# Patient Record
Sex: Male | Born: 1945
Health system: Southern US, Community
[De-identification: ages and names within clinical notes are randomized; demographics above are authoritative.]

## PROBLEM LIST (undated history)

## (undated) DIAGNOSIS — K219 Gastro-esophageal reflux disease without esophagitis: Secondary | ICD-10-CM

## (undated) DIAGNOSIS — I10 Essential (primary) hypertension: Secondary | ICD-10-CM

## (undated) DIAGNOSIS — E119 Type 2 diabetes mellitus without complications: Secondary | ICD-10-CM

## (undated) HISTORY — PX: FRACTURE SURGERY: SHX138

## (undated) HISTORY — PX: EYE SURGERY: SHX253

## (undated) HISTORY — PX: FEMUR FRACTURE SURGERY: SHX633

---

## 2005-11-26 ENCOUNTER — Ambulatory Visit: Payer: Self-pay | Admitting: Unknown Physician Specialty

## 2005-12-10 ENCOUNTER — Inpatient Hospital Stay: Payer: Self-pay | Admitting: Internal Medicine

## 2005-12-10 ENCOUNTER — Other Ambulatory Visit: Payer: Self-pay

## 2007-11-20 ENCOUNTER — Emergency Department: Payer: Self-pay | Admitting: Unknown Physician Specialty

## 2007-11-20 ENCOUNTER — Other Ambulatory Visit: Payer: Self-pay

## 2011-03-17 LAB — HM COLONOSCOPY

## 2012-06-30 ENCOUNTER — Ambulatory Visit: Payer: Self-pay | Admitting: Family Medicine

## 2013-02-08 ENCOUNTER — Emergency Department: Payer: Self-pay

## 2013-02-08 LAB — CBC
HCT: 44 % (ref 40.0–52.0)
MCHC: 35.3 g/dL (ref 32.0–36.0)
Platelet: 242 10*3/uL (ref 150–440)
RBC: 5.17 10*6/uL (ref 4.40–5.90)
RDW: 14.5 % (ref 11.5–14.5)
WBC: 11.6 10*3/uL — ABNORMAL HIGH (ref 3.8–10.6)

## 2013-02-08 LAB — COMPREHENSIVE METABOLIC PANEL
Alkaline Phosphatase: 48 U/L — ABNORMAL LOW (ref 50–136)
Anion Gap: 9 (ref 7–16)
Chloride: 101 mmol/L (ref 98–107)
Creatinine: 0.89 mg/dL (ref 0.60–1.30)
EGFR (African American): >60
EGFR (Non-African Amer.): >60
Glucose: 96 mg/dL (ref 65–99)
Potassium: 3.7 mmol/L (ref 3.5–5.1)
SGOT(AST): 9 U/L — ABNORMAL LOW (ref 15–37)
SGPT (ALT): 18 U/L (ref 12–78)
Sodium: 134 mmol/L — ABNORMAL LOW (ref 136–145)
Total Protein: 7.8 g/dL (ref 6.4–8.2)

## 2013-02-08 LAB — URINALYSIS, COMPLETE
Blood: NEGATIVE
Glucose,UR: NEGATIVE mg/dL (ref 0–75)
Hyaline Cast: 3
Leukocyte Esterase: NEGATIVE
Nitrite: NEGATIVE
Ph: 5 (ref 4.5–8.0)
Protein: NEGATIVE
Specific Gravity: 1.015 (ref 1.003–1.030)
WBC UR: 1 /HPF (ref 0–5)

## 2013-02-08 LAB — CK TOTAL AND CKMB (NOT AT ARMC)
CK-MB: 1 ng/mL (ref 0.5–3.6)
CK-MB: 0.9 ng/mL (ref 0.5–3.6)

## 2013-02-08 LAB — LIPASE, BLOOD: Lipase: 221 U/L (ref 73–393)

## 2013-02-08 LAB — TROPONIN I: Troponin-I: <0.02 ng/mL

## 2016-01-03 DIAGNOSIS — F17200 Nicotine dependence, unspecified, uncomplicated: Secondary | ICD-10-CM | POA: Diagnosis not present

## 2016-01-03 DIAGNOSIS — I10 Essential (primary) hypertension: Secondary | ICD-10-CM | POA: Diagnosis not present

## 2016-01-03 DIAGNOSIS — R5383 Other fatigue: Secondary | ICD-10-CM | POA: Diagnosis not present

## 2016-01-03 DIAGNOSIS — Z125 Encounter for screening for malignant neoplasm of prostate: Secondary | ICD-10-CM | POA: Diagnosis not present

## 2016-01-03 DIAGNOSIS — G4489 Other headache syndrome: Secondary | ICD-10-CM | POA: Diagnosis not present

## 2016-01-03 DIAGNOSIS — Z13818 Encounter for screening for other digestive system disorders: Secondary | ICD-10-CM | POA: Diagnosis not present

## 2016-01-06 DIAGNOSIS — R5383 Other fatigue: Secondary | ICD-10-CM | POA: Diagnosis not present

## 2016-01-06 DIAGNOSIS — R05 Cough: Secondary | ICD-10-CM | POA: Diagnosis not present

## 2016-01-10 DIAGNOSIS — R5383 Other fatigue: Secondary | ICD-10-CM | POA: Diagnosis not present

## 2016-01-10 DIAGNOSIS — I10 Essential (primary) hypertension: Secondary | ICD-10-CM | POA: Diagnosis not present

## 2016-01-10 DIAGNOSIS — G4489 Other headache syndrome: Secondary | ICD-10-CM | POA: Diagnosis not present

## 2016-01-10 DIAGNOSIS — E871 Hypo-osmolality and hyponatremia: Secondary | ICD-10-CM | POA: Diagnosis not present

## 2016-01-10 DIAGNOSIS — K219 Gastro-esophageal reflux disease without esophagitis: Secondary | ICD-10-CM | POA: Diagnosis not present

## 2016-01-17 DIAGNOSIS — F329 Major depressive disorder, single episode, unspecified: Secondary | ICD-10-CM | POA: Diagnosis not present

## 2016-01-17 DIAGNOSIS — I10 Essential (primary) hypertension: Secondary | ICD-10-CM | POA: Diagnosis not present

## 2016-01-17 DIAGNOSIS — G4700 Insomnia, unspecified: Secondary | ICD-10-CM | POA: Diagnosis not present

## 2016-01-27 DIAGNOSIS — G4700 Insomnia, unspecified: Secondary | ICD-10-CM | POA: Diagnosis not present

## 2016-01-27 DIAGNOSIS — I10 Essential (primary) hypertension: Secondary | ICD-10-CM | POA: Diagnosis not present

## 2016-01-27 DIAGNOSIS — R110 Nausea: Secondary | ICD-10-CM | POA: Diagnosis not present

## 2016-01-27 DIAGNOSIS — I951 Orthostatic hypotension: Secondary | ICD-10-CM | POA: Diagnosis not present

## 2016-01-27 DIAGNOSIS — F329 Major depressive disorder, single episode, unspecified: Secondary | ICD-10-CM | POA: Diagnosis not present

## 2016-01-27 DIAGNOSIS — G4489 Other headache syndrome: Secondary | ICD-10-CM | POA: Diagnosis not present

## 2016-01-27 DIAGNOSIS — M62830 Muscle spasm of back: Secondary | ICD-10-CM | POA: Diagnosis not present

## 2016-02-07 ENCOUNTER — Other Ambulatory Visit: Payer: Self-pay | Admitting: Internal Medicine

## 2016-02-07 DIAGNOSIS — R42 Dizziness and giddiness: Secondary | ICD-10-CM

## 2016-02-07 DIAGNOSIS — R110 Nausea: Secondary | ICD-10-CM

## 2016-02-07 DIAGNOSIS — I10 Essential (primary) hypertension: Secondary | ICD-10-CM | POA: Diagnosis not present

## 2016-02-07 DIAGNOSIS — F329 Major depressive disorder, single episode, unspecified: Secondary | ICD-10-CM | POA: Diagnosis not present

## 2016-02-07 DIAGNOSIS — J449 Chronic obstructive pulmonary disease, unspecified: Secondary | ICD-10-CM | POA: Diagnosis not present

## 2016-02-07 DIAGNOSIS — G4700 Insomnia, unspecified: Secondary | ICD-10-CM | POA: Diagnosis not present

## 2016-02-07 DIAGNOSIS — Z87898 Personal history of other specified conditions: Secondary | ICD-10-CM

## 2016-02-07 DIAGNOSIS — M62830 Muscle spasm of back: Secondary | ICD-10-CM | POA: Diagnosis not present

## 2016-02-27 ENCOUNTER — Ambulatory Visit: Payer: Self-pay

## 2016-04-17 DIAGNOSIS — I10 Essential (primary) hypertension: Secondary | ICD-10-CM | POA: Diagnosis not present

## 2016-04-17 DIAGNOSIS — K222 Esophageal obstruction: Secondary | ICD-10-CM | POA: Diagnosis not present

## 2016-08-18 DIAGNOSIS — Z1389 Encounter for screening for other disorder: Secondary | ICD-10-CM | POA: Diagnosis not present

## 2016-08-18 DIAGNOSIS — Z0000 Encounter for general adult medical examination without abnormal findings: Secondary | ICD-10-CM | POA: Diagnosis not present

## 2016-10-20 DIAGNOSIS — G4700 Insomnia, unspecified: Secondary | ICD-10-CM | POA: Diagnosis not present

## 2016-10-20 DIAGNOSIS — R252 Cramp and spasm: Secondary | ICD-10-CM | POA: Diagnosis not present

## 2016-10-20 DIAGNOSIS — F419 Anxiety disorder, unspecified: Secondary | ICD-10-CM | POA: Diagnosis not present

## 2016-10-20 DIAGNOSIS — E559 Vitamin D deficiency, unspecified: Secondary | ICD-10-CM | POA: Diagnosis not present

## 2016-10-20 DIAGNOSIS — Z1329 Encounter for screening for other suspected endocrine disorder: Secondary | ICD-10-CM | POA: Diagnosis not present

## 2016-10-20 DIAGNOSIS — Z13818 Encounter for screening for other digestive system disorders: Secondary | ICD-10-CM | POA: Diagnosis not present

## 2016-10-20 DIAGNOSIS — I10 Essential (primary) hypertension: Secondary | ICD-10-CM | POA: Diagnosis not present

## 2016-10-20 DIAGNOSIS — K219 Gastro-esophageal reflux disease without esophagitis: Secondary | ICD-10-CM | POA: Diagnosis not present

## 2016-10-20 DIAGNOSIS — F321 Major depressive disorder, single episode, moderate: Secondary | ICD-10-CM | POA: Diagnosis not present

## 2016-10-20 DIAGNOSIS — N529 Male erectile dysfunction, unspecified: Secondary | ICD-10-CM | POA: Diagnosis not present

## 2016-10-20 LAB — HEPATIC FUNCTION PANEL
ALT: 6 — AB (ref 10–40)
AST: 13 — AB (ref 14–40)
Alkaline Phosphatase: 47 (ref 25–125)
BILIRUBIN, TOTAL: 0.7

## 2016-10-20 LAB — HEMOGLOBIN A1C: Hemoglobin A1C: 6.1

## 2016-10-20 LAB — CBC AND DIFFERENTIAL
HCT: 46 (ref 41–53)
HEMOGLOBIN: 15.8 (ref 13.5–17.5)
NEUTROS ABS: 4
PLATELETS: 323 (ref 150–399)
WBC: 8.9

## 2016-10-20 LAB — LIPID PANEL
Cholesterol: 260 — AB (ref 0–200)
HDL: 46 (ref 35–70)
LDL Cholesterol: 157
TRIGLYCERIDES: 287 — AB (ref 40–160)

## 2016-10-20 LAB — BASIC METABOLIC PANEL
BUN: 8 (ref 4–21)
Creatinine: 0.8 (ref 0.6–1.3)
GLUCOSE: 127
POTASSIUM: 4.6 (ref 3.4–5.3)
Sodium: 130 — AB (ref 137–147)

## 2016-10-20 LAB — VITAMIN D 25 HYDROXY (VIT D DEFICIENCY, FRACTURES): Vit D, 25-Hydroxy: 18.1

## 2016-11-11 ENCOUNTER — Other Ambulatory Visit: Payer: Self-pay | Admitting: Adult Health

## 2016-11-11 DIAGNOSIS — Z87891 Personal history of nicotine dependence: Secondary | ICD-10-CM

## 2016-11-19 ENCOUNTER — Ambulatory Visit
Admission: RE | Admit: 2016-11-19 | Discharge: 2016-11-19 | Disposition: A | Discharge status: Home / Self Care | Payer: PPO | Source: Ambulatory Visit | Attending: Adult Health | Admitting: Adult Health

## 2016-11-19 DIAGNOSIS — Z87891 Personal history of nicotine dependence: Secondary | ICD-10-CM | POA: Diagnosis not present

## 2016-11-19 DIAGNOSIS — N281 Cyst of kidney, acquired: Secondary | ICD-10-CM | POA: Diagnosis not present

## 2016-11-19 DIAGNOSIS — R935 Abnormal findings on diagnostic imaging of other abdominal regions, including retroperitoneum: Secondary | ICD-10-CM | POA: Diagnosis not present

## 2016-11-30 DIAGNOSIS — F419 Anxiety disorder, unspecified: Secondary | ICD-10-CM | POA: Diagnosis not present

## 2016-11-30 DIAGNOSIS — G4700 Insomnia, unspecified: Secondary | ICD-10-CM | POA: Diagnosis not present

## 2016-11-30 DIAGNOSIS — F321 Major depressive disorder, single episode, moderate: Secondary | ICD-10-CM | POA: Diagnosis not present

## 2016-11-30 DIAGNOSIS — I10 Essential (primary) hypertension: Secondary | ICD-10-CM | POA: Diagnosis not present

## 2016-12-25 DIAGNOSIS — E119 Type 2 diabetes mellitus without complications: Secondary | ICD-10-CM | POA: Diagnosis not present

## 2016-12-28 DIAGNOSIS — I10 Essential (primary) hypertension: Secondary | ICD-10-CM | POA: Diagnosis not present

## 2017-01-14 DIAGNOSIS — I10 Essential (primary) hypertension: Secondary | ICD-10-CM | POA: Diagnosis not present

## 2017-05-11 ENCOUNTER — Ambulatory Visit (INDEPENDENT_AMBULATORY_CARE_PROVIDER_SITE_OTHER): Payer: PPO | Admitting: Physician Assistant

## 2017-05-11 ENCOUNTER — Encounter: Payer: Self-pay | Admitting: Physician Assistant

## 2017-05-11 VITALS — BP 126/84 | HR 88 | Temp 97.4°F | Resp 16 | Ht 69.0 in | Wt 144.0 lb

## 2017-05-11 DIAGNOSIS — F419 Anxiety disorder, unspecified: Secondary | ICD-10-CM | POA: Diagnosis not present

## 2017-05-11 DIAGNOSIS — Z23 Encounter for immunization: Secondary | ICD-10-CM

## 2017-05-11 DIAGNOSIS — Z8719 Personal history of other diseases of the digestive system: Secondary | ICD-10-CM

## 2017-05-11 DIAGNOSIS — Z7689 Persons encountering health services in other specified circumstances: Secondary | ICD-10-CM | POA: Diagnosis not present

## 2017-05-11 DIAGNOSIS — G4700 Insomnia, unspecified: Secondary | ICD-10-CM | POA: Insufficient documentation

## 2017-05-11 DIAGNOSIS — F4323 Adjustment disorder with mixed anxiety and depressed mood: Secondary | ICD-10-CM | POA: Insufficient documentation

## 2017-05-11 DIAGNOSIS — Z8711 Personal history of peptic ulcer disease: Secondary | ICD-10-CM

## 2017-05-11 DIAGNOSIS — I10 Essential (primary) hypertension: Secondary | ICD-10-CM | POA: Insufficient documentation

## 2017-05-11 MED ORDER — AMLODIPINE BESYLATE 5 MG PO TABS: 5.0000 mg | ORAL_TABLET | Freq: Every day | ORAL | 2 refills | Status: DC

## 2017-05-11 MED ORDER — LISINOPRIL 40 MG PO TABS: 40.0000 mg | ORAL_TABLET | Freq: Every day | ORAL | 2 refills | Status: DC

## 2017-05-11 MED ORDER — ZOLPIDEM TARTRATE 10 MG PO TABS: 10.0000 mg | ORAL_TABLET | Freq: Every evening | ORAL | 0 refills | Status: DC | PRN

## 2017-05-11 MED ORDER — ESCITALOPRAM OXALATE 10 MG PO TABS: 10.0000 mg | ORAL_TABLET | Freq: Every day | ORAL | 2 refills | Status: DC

## 2017-05-11 NOTE — Patient Instructions (Signed)

## 2017-05-11 NOTE — Progress Notes (Addendum)
Patient: Derrick Jones Male    DOB: [birth year]   71 y.o.   MRN: [id number] Visit Date: 05/11/2017  Today's Provider: Trinna Post, PA-C   Chief Complaint  Patient presents with  . Establish Care  . Anxiety   Subjective:      Derrick Jones is a 71 y/o man presenting today to establish care. He used to see Alvester Chou, NP at [facility] but his insurance changed and so he needed a new provider.   He has a history of HTN currently controlled on Lisinopril 40 mg QD and Norvasc 5 mg QD. He does not have headache, CP, edema, dizziness.   He has a history of insomnia. He reports he was taking Ambien 10 mg QD for this x several years. He has failed melatonin.  He has a history of anxiety. He was previously on Buspar 15 mg BID, which he took for three weeks and he felt didn't work. His anxiety has recently worsened. His 62 year old daughter who had breast cancer in the distant past was recently diagnosed with leptomeningeal metastasis. She is undergoing treatment at [facility] but her prognosis is poor and she is becoming increasingly less mobile. He has stopped taking his Buspar. He is not interested in counseling. No SI/HI  He smokes 1/2 pack per day and has for the past 15-20 years.  He has a history of alcohol misuse but does not currently use alcohol or drugs.  He has been married for 53 years. He has no other children. Denies history of colon or prostate cancer. He reports he had a colonoscopy 8 years ago at [facility].  He has a history of esophageal stricture diagnosed on EGD in 06/25/11 at [facility]. He also has a (+) history of H. Pylori which was treated. Recommended at that time 20 mg omeprazole BID indefinitely. He had a repeat EGD in 2014 that redemonstrated esophageal stricture and some erythematous mucosa. Pathology revealed no significant abnormality. Recommended to avoid ASA and NSAIDS, continue omeprazole 20 mg BID.  Anxiety  Presents for initial visit. Onset was 1 to 6 months  ago. The problem has been rapidly worsening. Symptoms include excessive worry, insomnia and nervous/anxious behavior. Patient reports no chest pain, confusion, decreased concentration, feeling of choking, palpitations, shortness of breath or suicidal ideas. The symptoms are aggravated by family issues (Pt's daughter was diagnosed with terminal cancer). The quality of sleep is fair.    Hypertension  This is a chronic problem. The problem is controlled (Home blood pressures are usually 130's /80's). Associated symptoms include anxiety and malaise/fatigue. Pertinent negatives include no blurred vision, chest pain, headaches, neck pain, orthopnea, palpitations, peripheral edema, PND, shortness of breath or sweats.      No Known Allergies   Current Outpatient Prescriptions:  .  omeprazole (PRILOSEC) 40 MG capsule, Take 40 mg by mouth., Disp: , Rfl:  .  amLODipine (NORVASC) 5 MG tablet, , Disp: , Rfl:  .  busPIRone (BUSPAR) 15 MG tablet, , Disp: , Rfl:  .  lisinopril (PRINIVIL,ZESTRIL) 40 MG tablet, , Disp: , Rfl:  .  PNEUMOVAX 23 25 MCG/0.5ML injection, , Disp: , Rfl:  .  zolpidem (AMBIEN) 10 MG tablet, , Disp: , Rfl:   Review of Systems  Constitutional: Positive for fatigue and malaise/fatigue. Negative for activity change, appetite change, chills, diaphoresis, fever and unexpected weight change.  HENT: Negative.   Eyes: Negative.  Negative for blurred vision.  Respiratory: Negative.  Negative for shortness of breath.   Cardiovascular: Negative.  Negative for chest pain, palpitations, orthopnea and PND.  Gastrointestinal: Negative.   Endocrine: Negative.   Genitourinary: Negative.   Musculoskeletal: Negative.  Negative for neck pain.  Skin: Negative.   Allergic/Immunologic: Negative.   Neurological: Negative.  Negative for headaches.  Hematological: Negative.   Psychiatric/Behavioral: Positive for sleep disturbance. Negative for agitation, behavioral problems, confusion, decreased  concentration, dysphoric mood, hallucinations, self-injury and suicidal ideas. The patient is nervous/anxious and has insomnia. The patient is not hyperactive.     Social History  Substance Use Topics  . Smoking status: Current Every Day Smoker  . Smokeless tobacco: Never Used  . Alcohol use No   Objective:   There were no vitals taken for this visit. There were no vitals filed for this visit.   Physical Exam  Constitutional: He is oriented to person, place, and time. He appears well-developed and well-nourished.  Neck: Neck supple.  Cardiovascular: Normal rate and regular rhythm.   Pulmonary/Chest: Effort normal. He has wheezes.  Slight expiratory wheeze in RLL  Abdominal: Soft. Bowel sounds are normal.  Lymphadenopathy:    He has no cervical adenopathy.  Neurological: He is alert and oriented to person, place, and time.  Skin: Skin is warm and dry.  Psychiatric: His mood appears anxious. His affect is not angry. He exhibits a depressed mood.        Assessment & Plan:     1. Encounter to establish care  Requesting colonoscopy from [facility] and prior tx records from PCP. UTD on pneumonia. Says he got Tdap a few years ago after cutting himself on rusty nail. He should f/u w/ me in 2 months to check medication and complete screenings. Also needs AWV, but he wants to wait on this.  2. Adjustment disorder with mixed anxiety and depressed mood  Pt has stopped taking Buspar. Declines counseling. Will try Lexapro. Discussed AE and length of time to work being 8 weeks.  - escitalopram (LEXAPRO) 10 MG tablet; Take 1 tablet (10 mg total) by mouth daily.  Dispense: 30 tablet; Refill: 2  3. Anxiety  See #2.  4. Hypertension, unspecified type  - amLODipine (NORVASC) 5 MG tablet; Take 1 tablet (5 mg total) by mouth daily.  Dispense: 30 tablet; Refill: 2 - lisinopril (PRINIVIL,ZESTRIL) 40 MG tablet; Take 1 tablet (40 mg total) by mouth daily.  Dispense: 30 tablet; Refill: 2  5.  Insomnia, unspecified type  [place]. Shows consistent use since 2013. No early refills, consistent provider and pharmacies. Counseled on sedation risk. Controlled substance agreement signed.  - zolpidem (AMBIEN) 10 MG tablet; Take 1 tablet (10 mg total) by mouth at bedtime as needed for sleep.  Dispense: 30 tablet; Refill: 0  6. History of gastric ulcer  Omeprazole 20 mg BID indefinitely.   7. Flu vaccine need  - Flu vaccine HIGH DOSE PF (Fluzone High dose)  8. History of esophageal stricture  Followed by GI, has had two epiosdes, on in 2012 and one in 2014.   Return in about 2 months (around 07/11/2017) for anxiety, labwork.  The entirety of the information documented in the History of Present Illness, Review of Systems and Physical Exam were personally obtained by me. Portions of this information were initially documented by Ashley Royalty, CMA and reviewed by me for thoroughness and accuracy.          Trinna Post, PA-C  [facility]

## 2017-05-20 ENCOUNTER — Encounter: Payer: Self-pay | Admitting: Physician Assistant

## 2017-05-27 ENCOUNTER — Other Ambulatory Visit: Payer: Self-pay | Admitting: Physician Assistant

## 2017-05-27 DIAGNOSIS — R7303 Prediabetes: Secondary | ICD-10-CM | POA: Insufficient documentation

## 2017-06-02 IMAGING — US US RETROPERITONEAL COMPLETE
1 series · 13 of 25 positions shown · non-contrast
Comparison: CT scan lab 02/08/2013

CLINICAL DATA: History of tobacco use

EXAM:
ULTRASOUND RETROPERITONEAL COMPLETE
TECHNIQUE: Ultrasound examination of the abdominal aorta was performed to
evaluate for abdominal aortic aneurysm. The common iliac arteries,
IVC, and kidneys were also evaluated.

[Series 1: us retroperitoneal complete · 0.23mm/px · 13 of 62 slices shown]
[im 1/62]
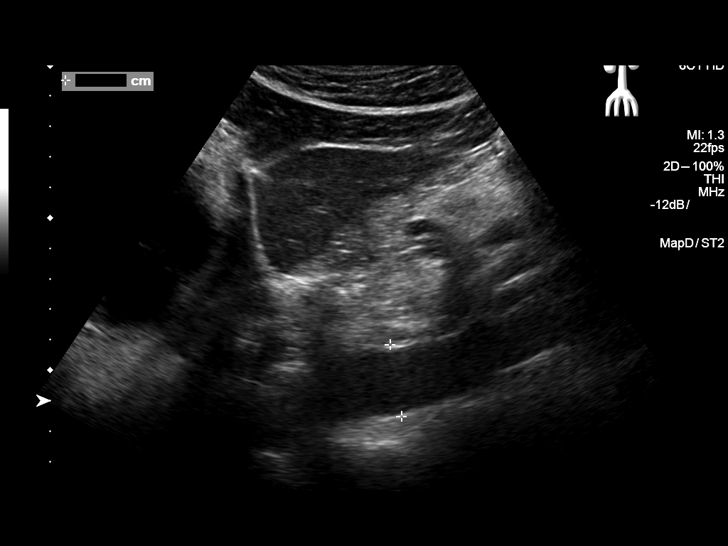
[im 6/62]
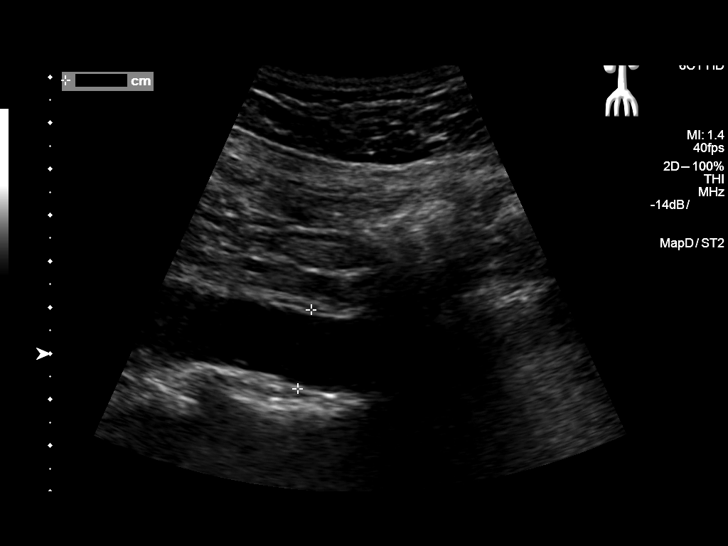
[im 11/62]
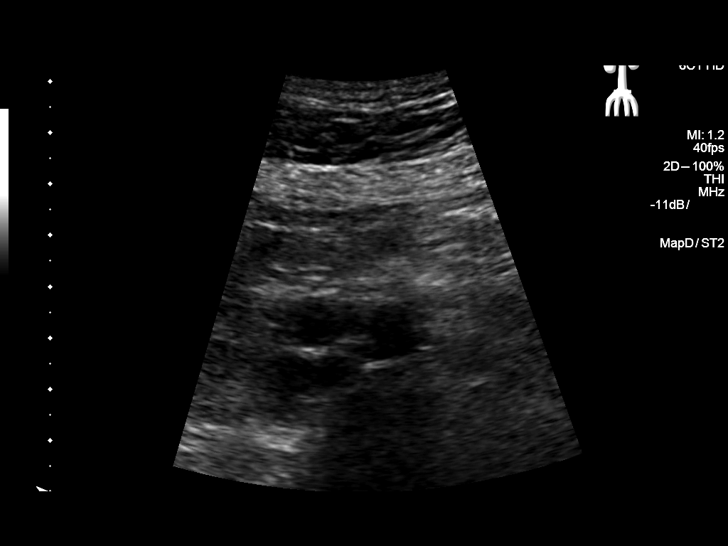
[im 16/62]
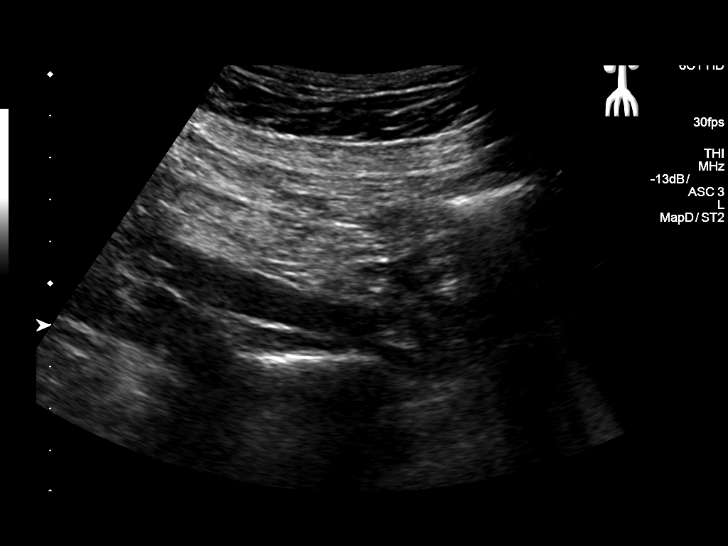
[im 21/62]
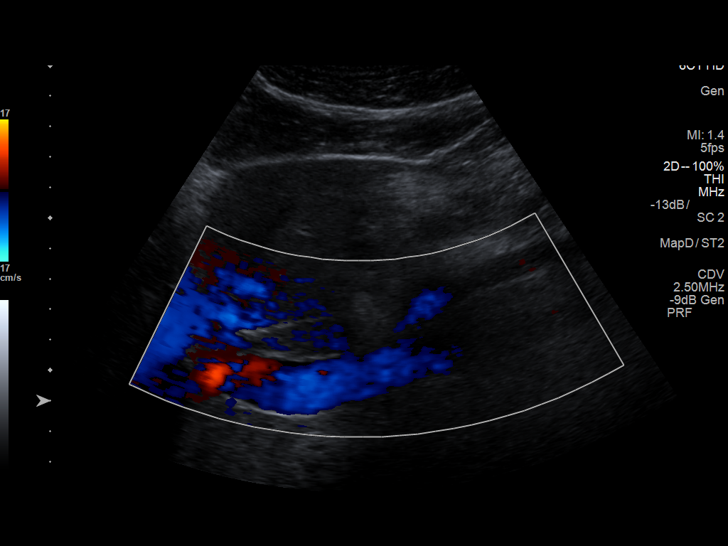
[im 26/62]
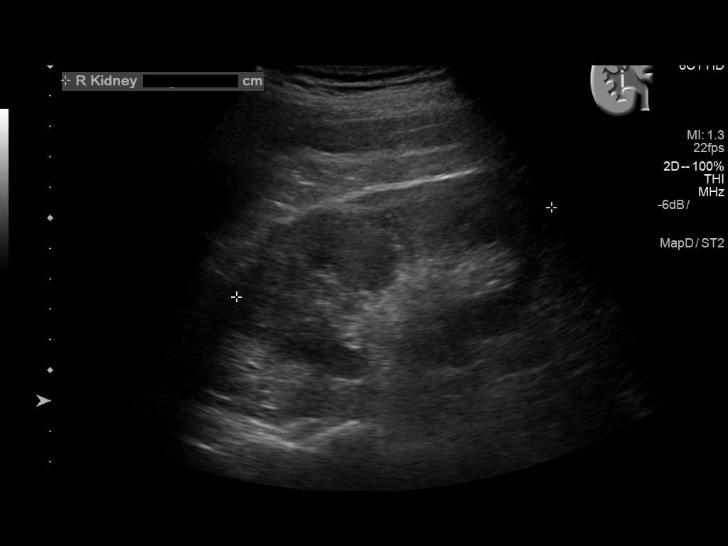
[im 31/62]
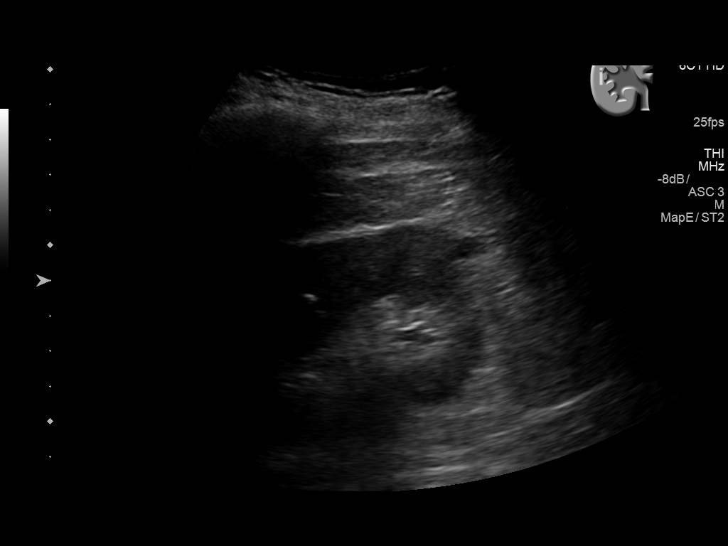
[im 36/62]
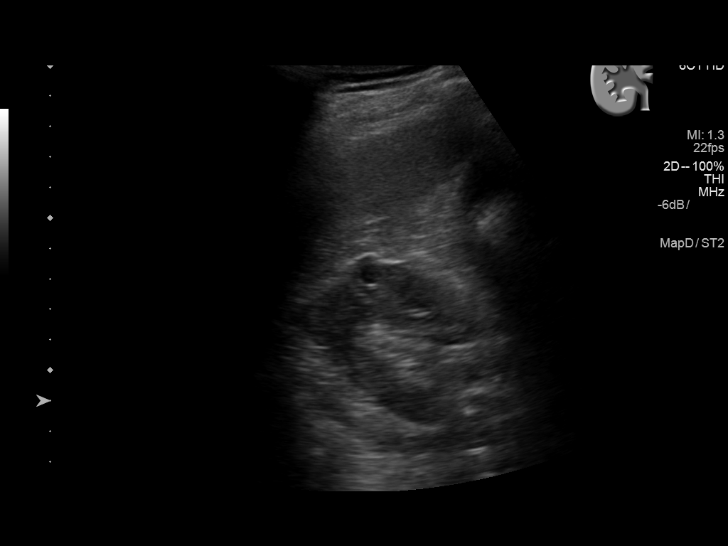
[im 41/62]
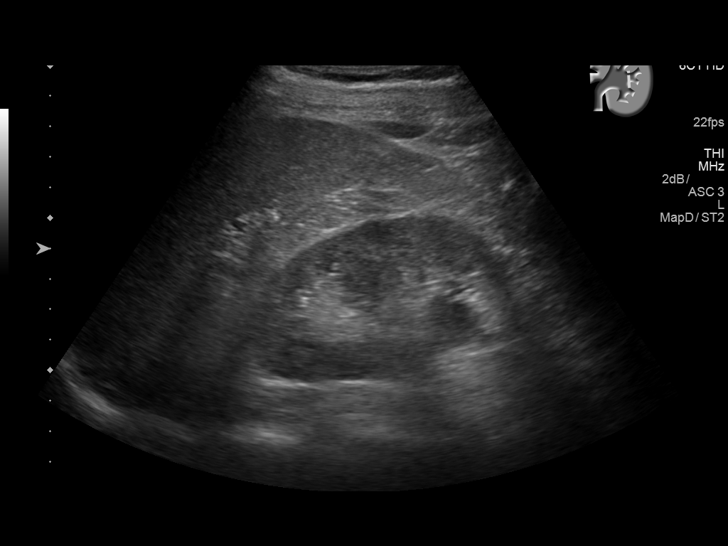
[im 46/62]
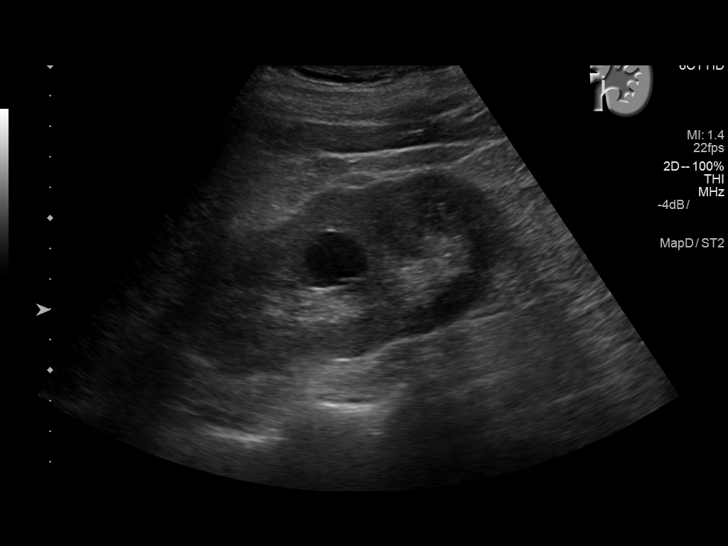
[im 51/62]
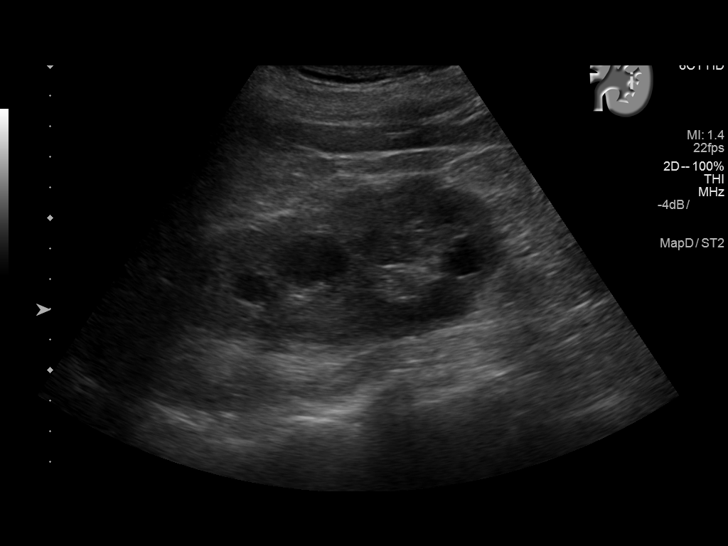
[im 56/62]
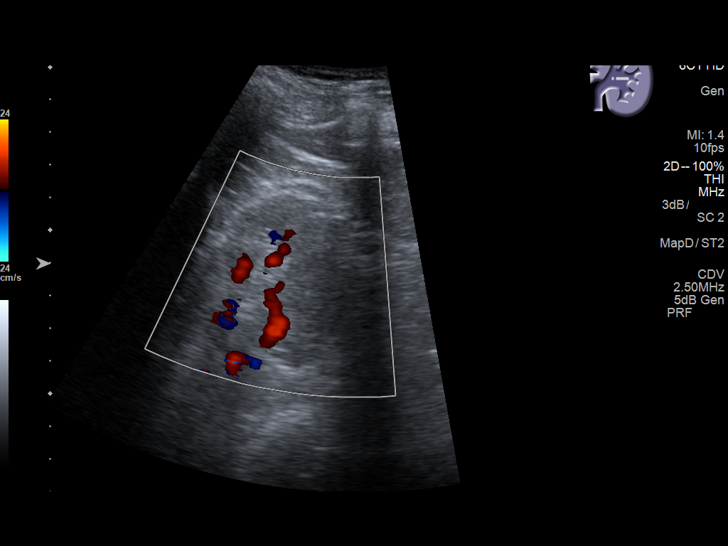
[im 62/62]
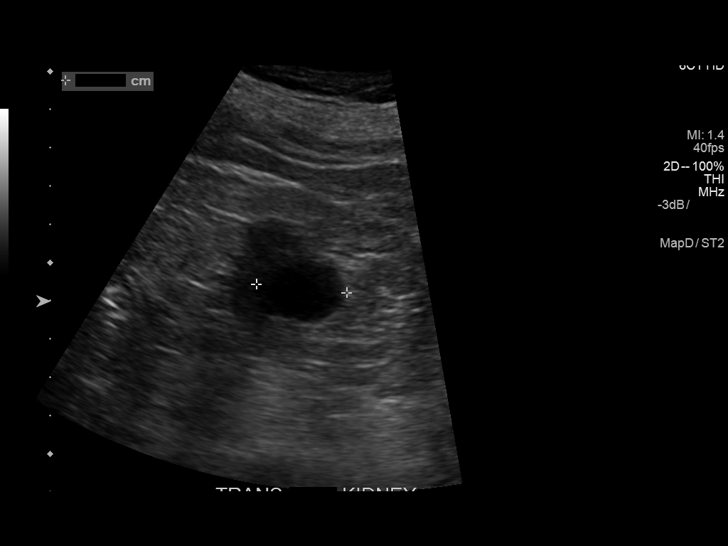

[13 of 25 positions shown; findings below may reference images not displayed]

FINDINGS: Abdominal Aorta

No aneurysm identified.

Maximum AP

Diameter:  2.4  in diameter.

Maximum TRV

Diameter: 2.1 cm

Right Common Iliac Artery

No aneurysm identified.  Measures 1.1 cm in diameter.

Left Common Iliac Artery

No aneurysm identified.  Measures 1.2 cm in diameter.

IVC

No abnormality visualized.

Right Kidney

Length: 10.7 cm normal echogenicity. No hydronephrosis. Scattered
right renal cysts are noted. Cyst in lower pole measures 1.5 by
cm. Exophytic cyst in midpole measures 1 x 1.1 cm. No renal calculi
are noted.

Left Kidney

Length: 10.2 cm normal echogenicity. No hydronephrosis. A midpole
cyst measures 2 x 1.9 cm. Cyst in lower pole measures 2.4 x 2.1 cm.
IMPRESSION: 1. No evidence of abdominal aortic aneurysm. Aorta measures 2.4 x
2.1 maximum diameter.
2. No common iliac arteries aneurysm.
3. No hydronephrosis. No renal calculi. Bilateral renal cysts as
described above.

## 2017-06-03 ENCOUNTER — Telehealth: Payer: Self-pay | Admitting: Physician Assistant

## 2017-06-03 NOTE — Telephone Encounter (Signed)
ROI ([facility]) faxed to Al[facility] Chou - [facility] for records.  Received from [place] to [place], forward 72 pages to PA-Adriana

## 2017-06-03 NOTE — Telephone Encounter (Signed)
ROI ([facility]) faxed to [place] for records.

## 2017-06-17 ENCOUNTER — Encounter: Payer: Self-pay | Admitting: Physician Assistant

## 2017-07-02 ENCOUNTER — Ambulatory Visit: Payer: PPO | Admitting: Physician Assistant

## 2017-07-02 ENCOUNTER — Encounter: Payer: Self-pay | Admitting: Physician Assistant

## 2017-07-02 VITALS — BP 120/82 | HR 92 | Temp 98.2°F | Resp 16 | Ht 69.0 in | Wt 145.0 lb

## 2017-07-02 DIAGNOSIS — G4700 Insomnia, unspecified: Secondary | ICD-10-CM | POA: Diagnosis not present

## 2017-07-02 DIAGNOSIS — E785 Hyperlipidemia, unspecified: Secondary | ICD-10-CM | POA: Diagnosis not present

## 2017-07-02 DIAGNOSIS — R12 Heartburn: Secondary | ICD-10-CM

## 2017-07-02 DIAGNOSIS — F4323 Adjustment disorder with mixed anxiety and depressed mood: Secondary | ICD-10-CM

## 2017-07-02 LAB — LIPID PANEL
Cholesterol: 227 mg/dL — ABNORMAL HIGH (ref <200)
HDL: 45 mg/dL (ref >40)
LDL Cholesterol (Calc): 145 mg/dL (calc) — ABNORMAL HIGH
Non-HDL Cholesterol (Calc): 182 mg/dL (calc) — ABNORMAL HIGH (ref <130)
Total CHOL/HDL Ratio: 5 (calc) — ABNORMAL HIGH (ref <5.0)
Triglycerides: 229 mg/dL — ABNORMAL HIGH (ref <150)

## 2017-07-02 MED ORDER — SERTRALINE HCL 50 MG PO TABS: 50.0000 mg | ORAL_TABLET | Freq: Every day | ORAL | 3 refills | Status: DC

## 2017-07-02 MED ORDER — OMEPRAZOLE 20 MG PO CPDR: 20.0000 mg | DELAYED_RELEASE_CAPSULE | Freq: Every day | ORAL | 0 refills | Status: DC

## 2017-07-02 MED ORDER — HYDROXYZINE PAMOATE 25 MG PO CAPS: 25.0000 mg | ORAL_CAPSULE | Freq: Every day | ORAL | 0 refills | Status: DC | PRN

## 2017-07-02 MED ORDER — TRAZODONE HCL 50 MG PO TABS: 25.0000 mg | ORAL_TABLET | Freq: Every evening | ORAL | 3 refills | Status: DC | PRN

## 2017-07-02 NOTE — Patient Instructions (Signed)

## 2017-07-02 NOTE — Progress Notes (Addendum)
Patient: Derrick Jones Male    DOB: [birth year]   71 y.o.   MRN: [id number] Visit Date: 07/02/2017  Today's Provider: Trinna Post, PA-C   Chief Complaint  Patient presents with  . Depression   Subjective:    Derrick Jones is a 71 y/o man presenting today for follow up of depression, anxiety, HTN, and HLD.   His anxiety and depression are worsening. He took 30 days of Lexapro without success. He says it made him confused and his face feel like it was burning in the morning. He is having trouble sleeping. He had 30 days of Ambien. He has never tried trazodone before. He is under a lot of stress with his daughter who is terminally ill with metastatic breast cancer. He says he goes to her house 4-5 times per day. He says he gets very anxious and shaky when she starts talking about dying. Thus far, he has failed trials of Lexapro, Celexa, and Buspar. He is open to counseling/support groups.  He is requesting medication for anxiety. Has history of alcohol abuse.  His BP is well controlled. Tolerating medications OK.   Cholesterol from previous PCP very elevated. He has a 29% 10 year CVD risk.   Depression         This is a chronic problem.The problem is unchanged.  Associated symptoms include decreased concentration, fatigue, insomnia and restlessness.  Associated symptoms include no decreased interest, no appetite change, no headaches, not sad and no suicidal ideas.  Past medical history includes anxiety.   Insomnia  Primary symptoms: sleep disturbance, frequent awakening.  The current episode started more than one year. The problem occurs nightly. The problem is unchanged. How many beverages per day that contain caffeine: 0 - 1.  Types of beverages you drink: coffee. PMH includes: hypertension, depression, family stress or anxiety.  Anxiety  Presents for follow-up visit. Symptoms include decreased concentration, excessive worry, insomnia, nervous/anxious behavior, panic and  restlessness. Patient reports no confusion, dizziness, shortness of breath or suicidal ideas. The severity of symptoms is severe. The quality of sleep is poor. Nighttime awakenings: several.         No Known Allergies   Current Outpatient Medications:  .  amLODipine (NORVASC) 5 MG tablet, Take 1 tablet (5 mg total) by mouth daily., Disp: 30 tablet, Rfl: 2 .  lisinopril (PRINIVIL,ZESTRIL) 40 MG tablet, Take 1 tablet (40 mg total) by mouth daily., Disp: 30 tablet, Rfl: 2 .  omeprazole (PRILOSEC) 40 MG capsule, Take 40 mg by mouth., Disp: , Rfl:  .  zolpidem (AMBIEN) 10 MG tablet, Take 1 tablet (10 mg total) by mouth at bedtime as needed for sleep., Disp: 30 tablet, Rfl: 0 .  escitalopram (LEXAPRO) 10 MG tablet, Take 1 tablet (10 mg total) by mouth daily. (Patient not taking: Reported on 07/02/2017), Disp: 30 tablet, Rfl: 2 .  PNEUMOVAX 23 25 MCG/0.5ML injection, , Disp: , Rfl:   Review of Systems  Constitutional: Positive for fatigue. Negative for activity change, appetite change, chills, diaphoresis and unexpected weight change.  Respiratory: Negative.  Negative for shortness of breath.   Cardiovascular: Negative.   Gastrointestinal: Negative.   Neurological: Negative for dizziness, light-headedness and headaches.  Psychiatric/Behavioral: Positive for decreased concentration, depression and sleep disturbance. Negative for agitation, behavioral problems, confusion, dysphoric mood, hallucinations, self-injury and suicidal ideas. The patient is nervous/anxious and has insomnia. The patient is not hyperactive.     Social History   Tobacco Use  .  Smoking status: Current Every Day Smoker  . Smokeless tobacco: Never Used  Substance Use Topics  . Alcohol use: No   Objective:   BP 120/82 (BP Location: Right Arm, Patient Position: Sitting, Cuff Size: Normal)   Pulse 92   Temp 98.2 F (36.8 C) (Oral)   Resp 16   Ht 5\' 9"  (1.753 m)   Wt 145 lb (65.8 kg)   BMI 21.41 kg/m  Vitals:    07/02/17 1333  BP: 120/82  Pulse: 92  Resp: 16  Temp: 98.2 F (36.8 C)  TempSrc: Oral  Weight: 145 lb (65.8 kg)  Height: 5\' 9"  (1.753 m)     Physical Exam  Constitutional: He is oriented to person, place, and time. He appears well-developed and well-nourished.  Cardiovascular: Normal rate and regular rhythm.  Pulmonary/Chest: Effort normal and breath sounds normal.  Neurological: He is alert and oriented to person, place, and time.  Skin: Skin is warm and dry.  Psychiatric: His behavior is normal. His mood appears anxious. He exhibits a depressed mood.  Tearful in office today when talking about his daughter.         Assessment & Plan:     1. Adjustment disorder with mixed anxiety and depressed mood  Worsening. Will start Zoloft. Explained that this is long term medication for anxiety. Explained that benzodiazepines are inappropriate in context of his previous alcohol abuse. Counseled that these are addictive and do not address root cause of anxiety. Will give Vistaril for PRN anxiety, this is sedating. Should not drive with this, may increase risk of falls.  - [facility]ulatory [facility] to [facility] - sertraline (ZOLOFT) 50 MG tablet; Take 1 tablet (50 mg total) daily by mouth.  Dispense: 30 tablet; Refill: 3 - hydrOXYzine (VISTARIL) 25 MG capsule; Take 1 capsule (25 mg total) daily as needed by mouth.  Dispense: 30 capsule; Refill: 0  2. Insomnia, unspecified type  Has previously been on Ambien. Has never tried trazodone.   - traZODone (DESYREL) 50 MG tablet; Take 0.5-1 tablets (25-50 mg total) at bedtime as needed by mouth for sleep.  Dispense: 30 tablet; Refill: 3  3. Hyperlipidemia, unspecified hyperlipidemia type  Will draw lab today and review at follow up visit. - Lipid Profile  4. Heartburn  - omeprazole (PRILOSEC) 20 MG capsule; Take 1 capsule (20 mg total) daily by mouth.  Dispense: 90 capsule; Refill: 0  Return in about 1 month (around 08/01/2017).  The  entirety of the information documented in the History of Present Illness, Review of Systems and Physical Exam were personally obtained by me. Portions of this information were initially documented by Ashley Royalty, CMA and reviewed by me for thoroughness and accuracy.        Trinna Post, PA-C  [facility]

## 2017-07-03 ENCOUNTER — Telehealth: Payer: Self-pay | Admitting: Physician Assistant

## 2017-07-03 NOTE — Telephone Encounter (Signed)
Appears that Derrick Jones discussed the switch off of Ambien with the patient.  He may double the dose of Trazodone and take 100mg  (2 pills) tonight at bedtime.  Will not override PCP's decision about prescribing controlled substance.  Also, at his age, it is harmful to take sleep medications like Ambien chronically.  Can discuss further with PCP on Monday.  [name]

## 2017-07-03 NOTE — Telephone Encounter (Signed)
Pt advised.  He is going to try that and call us Monday.   Thanks,   -Mickel Baas

## 2017-07-03 NOTE — Telephone Encounter (Signed)
Derrick Jones walked in this morning saying Derrick Jones gave him Trazedone instead of Ambien (as he usually takes for sleep).   He said he took on last night and he did not fall asleep till after 2:00 a.m.  He wants to switch back to Ambien and wanted it done today.

## 2017-07-05 NOTE — Telephone Encounter (Signed)
Noted, thank you

## 2017-07-12 ENCOUNTER — Telehealth: Payer: Self-pay | Admitting: Physician Assistant

## 2017-07-30 ENCOUNTER — Ambulatory Visit: Payer: PPO | Admitting: Physician Assistant

## 2017-08-02 ENCOUNTER — Ambulatory Visit: Payer: PPO | Admitting: Physician Assistant

## 2017-08-02 ENCOUNTER — Encounter: Payer: Self-pay | Admitting: Physician Assistant

## 2017-08-02 ENCOUNTER — Telehealth: Payer: Self-pay | Admitting: Physician Assistant

## 2017-08-02 VITALS — BP 134/84 | HR 72 | Resp 16 | Wt 146.0 lb

## 2017-08-02 DIAGNOSIS — G4700 Insomnia, unspecified: Secondary | ICD-10-CM

## 2017-08-02 DIAGNOSIS — F419 Anxiety disorder, unspecified: Secondary | ICD-10-CM | POA: Diagnosis not present

## 2017-08-02 DIAGNOSIS — F4323 Adjustment disorder with mixed anxiety and depressed mood: Secondary | ICD-10-CM | POA: Diagnosis not present

## 2017-08-02 DIAGNOSIS — F1011 Alcohol abuse, in remission: Secondary | ICD-10-CM | POA: Insufficient documentation

## 2017-08-02 DIAGNOSIS — Z87898 Personal history of other specified conditions: Secondary | ICD-10-CM

## 2017-08-02 DIAGNOSIS — E7800 Pure hypercholesterolemia, unspecified: Secondary | ICD-10-CM | POA: Diagnosis not present

## 2017-08-02 NOTE — Progress Notes (Signed)
Patient: Derrick Jones Male    DOB: October 07, 1945   71 y.o.   MRN: [id number] Visit Date: 08/02/2017  Today's Provider: Trinna Post, PA-C   Chief Complaint  Patient presents with  . Depression  . Anxiety   Subjective:    Derrick Jones is a 64 man with history of hypercholesterolemia, depression, anxiety, alcohol abuse, and insomnia.   Last visit he was changed 10 mg lexapro to 50 mg zoloft for anxiety and depression. Initially in the visit, he says this is helpful. He says he doesn't want to increase the dose. He declines seeing psychiatry. Then he says that he needs something stronger. He says he is not on anything for anxiety currently.   He says he is still experiencing insomnia. He has been experiencing this since July when his daughter got diagnosed with cancer. He was previously on Ambien. He was discontinued from this and started on trazodone, which he had never tried before. He says he tried one pill one night, and two pills the second night. He says he experienced an erection but no insomnia relief. He says he spoke to his pharmacist and the pharmacist said that this was only for depression. He has not sought out a support group or grief counseling.  Of note, he presented to [facility] on 07/03/2017. He did not call ahead or schedule an appointment. He spoke to administrative staff at the front desk wanting his medications switched back to Ambien and that he wanted it done that day. Dr. Brita Romp who was covering at that day advised him he could double his trazodone.  Also, his cholesterol remains high. He has a 25% 10 year CVD risk. He says he used to be on simvastatin. He says he doesn't want to start a statin today, he says he is in "the fourth quarter of his life" anyway.    Depression         This is a chronic problem.The problem is unchanged.  Associated symptoms include fatigue (Some days) and insomnia.  Associated symptoms include no decreased concentration, no  restlessness, no appetite change, no body aches, no myalgias, no headaches, not sad and no suicidal ideas.     The symptoms are aggravated by family issues.  Past medical history includes anxiety.   Anxiety  Presents for follow-up visit. Symptoms include insomnia. Patient reports no confusion, decreased concentration, dizziness, malaise, nervous/anxious behavior, palpitations, restlessness or suicidal ideas.         No Known Allergies   Current Outpatient Medications:  .  amLODipine (NORVASC) 5 MG tablet, Take 1 tablet (5 mg total) by mouth daily., Disp: 30 tablet, Rfl: 2 .  hydrOXYzine (VISTARIL) 25 MG capsule, Take 1 capsule (25 mg total) daily as needed by mouth., Disp: 30 capsule, Rfl: 0 .  lisinopril (PRINIVIL,ZESTRIL) 40 MG tablet, Take 1 tablet (40 mg total) by mouth daily., Disp: 30 tablet, Rfl: 2 .  omeprazole (PRILOSEC) 20 MG capsule, Take 1 capsule (20 mg total) daily by mouth., Disp: 90 capsule, Rfl: 0 .  sertraline (ZOLOFT) 50 MG tablet, Take 1 tablet (50 mg total) daily by mouth., Disp: 30 tablet, Rfl: 3 .  traZODone (DESYREL) 50 MG tablet, Take 0.5-1 tablets (25-50 mg total) at bedtime as needed by mouth for sleep., Disp: 30 tablet, Rfl: 3 .  PNEUMOVAX 23 25 MCG/0.5ML injection, , Disp: , Rfl:   Review of Systems  Constitutional: Positive for fatigue (Some days). Negative for appetite change.  Respiratory: Negative.   Cardiovascular: Negative.  Negative for palpitations.  Gastrointestinal: Negative.   Musculoskeletal: Negative for myalgias.  Neurological: Negative for dizziness, light-headedness and headaches.  Psychiatric/Behavioral: Positive for depression and sleep disturbance. Negative for agitation, behavioral problems, confusion, decreased concentration, dysphoric mood, hallucinations, self-injury and suicidal ideas. The patient has insomnia. The patient is not nervous/anxious and is not hyperactive.        Pt reports his anxiety and depression "Comes and Goes".  He  has some good days.     Social History   Tobacco Use  . Smoking status: Current Every Day Smoker  . Smokeless tobacco: Never Used  Substance Use Topics  . Alcohol use: No   Objective:   BP 134/84 (BP Location: Left Arm, Patient Position: Sitting, Cuff Size: Normal)   Pulse 72   Resp 16   Wt 146 lb (66.2 kg)   SpO2 96%   BMI 21.56 kg/m  Vitals:   08/02/17 1603  BP: 134/84  Pulse: 72  Resp: 16  SpO2: 96%  Weight: 146 lb (66.2 kg)     Physical Exam  Constitutional: He is oriented to person, place, and time. He appears well-developed and well-nourished.  Patient pleasant initially. However, he walks out of the office before I could complete the exam.   Neurological: He is alert and oriented to person, place, and time.  Skin: Skin is warm and dry.  Psychiatric: He has a normal mood and affect. His behavior is normal.        Assessment & Plan:     1. Adjustment disorder with mixed anxiety and depressed mood  Presenting today possibly with some improvement. He says the zoloft has helped him some. He reports his insomnia is still untreated. Does not want to take trazodone again, and do not think Doxepin would be appropriate either considering his side effects. I encouraged him to see a psychiatrist and he repeatedly declines. He says he isn't crazy, he just can't sleep. I express to him that I think his insomnia is due to his unmanaged anxiety, and offer to increase the zoloft, which he refuses. He says he needs something "stronger," for anxiety. I tell him he is already on a medication for anxiety, zoloft. He has displayed similar behavior at our previous visit, where he requested something "stronger" for anxiety, claiming he wasn't on anything for anxiety, despite the fact that he was on Lexapro. I have expressed numerous times that given his age and history of alcohol abuse, I do not think Z-drugs or or benzodiazepines are appropriate. I have repeatedly encouraged him to seek  grief counseling, which he has not.  Before I can discuss any future follow up with him, he stands up from his chair and proceeds to walk towards the door. He says, "I guess I'll just find another doctor then. They can see you in Epic, right?" He then walks out of the exam room before I am able to say anything else.   I did not get an opportunity to discuss the nature of his visit to our Saturday clinic and that it was inappropriate.  He is obviously going through a very difficult time right now with his daughter's illness. However, he has displayed aggressive and inappropriate behavior during our 07/03/2017 Saturday clinic and again with me today in clinic. He does not seem to be benefiting from this provider-patient relationship. I will proceed with dismissal.  2. Insomnia, unspecified type  Persists. Declines psychiatry referral for anxiety management. Wants  Ambien.  3. Anxiety  See #1.  4. History of alcohol abuse  See #1  5. Hypercholesterolemia  Declines.  No Follow-up on file.  I have spent 15 minutes with this patient, >50% of which was spent on counseling and coordination of care.       Trinna Post, PA-C  [facility]

## 2017-08-03 NOTE — Telephone Encounter (Signed)
Patient dismissal letter will be sent.

## 2017-11-12 DIAGNOSIS — I10 Essential (primary) hypertension: Secondary | ICD-10-CM | POA: Diagnosis not present

## 2017-11-12 DIAGNOSIS — F17200 Nicotine dependence, unspecified, uncomplicated: Secondary | ICD-10-CM | POA: Diagnosis not present

## 2017-11-16 DIAGNOSIS — I10 Essential (primary) hypertension: Secondary | ICD-10-CM | POA: Diagnosis not present

## 2017-11-16 DIAGNOSIS — E7849 Other hyperlipidemia: Secondary | ICD-10-CM | POA: Diagnosis not present

## 2017-11-16 DIAGNOSIS — R5381 Other malaise: Secondary | ICD-10-CM | POA: Diagnosis not present

## 2017-11-16 DIAGNOSIS — Z125 Encounter for screening for malignant neoplasm of prostate: Secondary | ICD-10-CM | POA: Diagnosis not present

## 2017-11-18 NOTE — Telephone Encounter (Signed)
complete

## 2017-11-19 DIAGNOSIS — E119 Type 2 diabetes mellitus without complications: Secondary | ICD-10-CM | POA: Diagnosis not present

## 2017-11-19 DIAGNOSIS — F17200 Nicotine dependence, unspecified, uncomplicated: Secondary | ICD-10-CM | POA: Diagnosis not present

## 2017-12-30 DIAGNOSIS — F17200 Nicotine dependence, unspecified, uncomplicated: Secondary | ICD-10-CM | POA: Diagnosis not present

## 2017-12-30 DIAGNOSIS — R062 Wheezing: Secondary | ICD-10-CM | POA: Diagnosis not present

## 2017-12-30 DIAGNOSIS — J45909 Unspecified asthma, uncomplicated: Secondary | ICD-10-CM | POA: Diagnosis not present

## 2017-12-30 DIAGNOSIS — J988 Other specified respiratory disorders: Secondary | ICD-10-CM | POA: Diagnosis not present

## 2018-04-08 DIAGNOSIS — E119 Type 2 diabetes mellitus without complications: Secondary | ICD-10-CM | POA: Diagnosis not present

## 2018-04-08 DIAGNOSIS — K222 Esophageal obstruction: Secondary | ICD-10-CM | POA: Diagnosis not present

## 2018-04-08 DIAGNOSIS — J988 Other specified respiratory disorders: Secondary | ICD-10-CM | POA: Diagnosis not present

## 2018-04-08 DIAGNOSIS — F17200 Nicotine dependence, unspecified, uncomplicated: Secondary | ICD-10-CM | POA: Diagnosis not present

## 2018-05-05 DIAGNOSIS — M542 Cervicalgia: Secondary | ICD-10-CM | POA: Diagnosis not present

## 2018-05-16 DIAGNOSIS — M542 Cervicalgia: Secondary | ICD-10-CM | POA: Diagnosis not present

## 2018-07-19 DIAGNOSIS — F17200 Nicotine dependence, unspecified, uncomplicated: Secondary | ICD-10-CM | POA: Diagnosis not present

## 2018-07-19 DIAGNOSIS — J988 Other specified respiratory disorders: Secondary | ICD-10-CM | POA: Diagnosis not present

## 2018-07-19 DIAGNOSIS — J45909 Unspecified asthma, uncomplicated: Secondary | ICD-10-CM | POA: Diagnosis not present

## 2018-07-19 DIAGNOSIS — Z0000 Encounter for general adult medical examination without abnormal findings: Secondary | ICD-10-CM | POA: Diagnosis not present

## 2018-07-19 DIAGNOSIS — E119 Type 2 diabetes mellitus without complications: Secondary | ICD-10-CM | POA: Diagnosis not present

## 2018-09-30 DIAGNOSIS — F329 Major depressive disorder, single episode, unspecified: Secondary | ICD-10-CM | POA: Diagnosis not present

## 2018-09-30 DIAGNOSIS — F17200 Nicotine dependence, unspecified, uncomplicated: Secondary | ICD-10-CM | POA: Diagnosis not present

## 2018-09-30 DIAGNOSIS — K222 Esophageal obstruction: Secondary | ICD-10-CM | POA: Diagnosis not present

## 2018-09-30 DIAGNOSIS — F419 Anxiety disorder, unspecified: Secondary | ICD-10-CM | POA: Diagnosis not present

## 2018-12-29 DIAGNOSIS — I10 Essential (primary) hypertension: Secondary | ICD-10-CM | POA: Diagnosis not present

## 2018-12-29 DIAGNOSIS — F329 Major depressive disorder, single episode, unspecified: Secondary | ICD-10-CM | POA: Diagnosis not present

## 2018-12-29 DIAGNOSIS — F17200 Nicotine dependence, unspecified, uncomplicated: Secondary | ICD-10-CM | POA: Diagnosis not present

## 2018-12-29 DIAGNOSIS — F419 Anxiety disorder, unspecified: Secondary | ICD-10-CM | POA: Diagnosis not present

## 2019-05-09 ENCOUNTER — Other Ambulatory Visit: Payer: Self-pay

## 2019-05-09 ENCOUNTER — Emergency Department: Payer: PPO

## 2019-05-09 ENCOUNTER — Inpatient Hospital Stay
Admission: EM | Admit: 2019-05-09 | Discharge: 2019-05-10 | DRG: 641 | Disposition: A | Discharge status: Home / Self Care | Payer: PPO | Attending: Internal Medicine | Admitting: Internal Medicine

## 2019-05-09 DIAGNOSIS — Z803 Family history of malignant neoplasm of breast: Secondary | ICD-10-CM | POA: Diagnosis not present

## 2019-05-09 DIAGNOSIS — R000 Tachycardia, unspecified: Secondary | ICD-10-CM | POA: Diagnosis not present

## 2019-05-09 DIAGNOSIS — I10 Essential (primary) hypertension: Secondary | ICD-10-CM | POA: Diagnosis not present

## 2019-05-09 DIAGNOSIS — R569 Unspecified convulsions: Secondary | ICD-10-CM | POA: Diagnosis not present

## 2019-05-09 DIAGNOSIS — Z8041 Family history of malignant neoplasm of ovary: Secondary | ICD-10-CM | POA: Diagnosis not present

## 2019-05-09 DIAGNOSIS — Z7984 Long term (current) use of oral hypoglycemic drugs: Secondary | ICD-10-CM

## 2019-05-09 DIAGNOSIS — Z20828 Contact with and (suspected) exposure to other viral communicable diseases: Secondary | ICD-10-CM | POA: Diagnosis present

## 2019-05-09 DIAGNOSIS — Z716 Tobacco abuse counseling: Secondary | ICD-10-CM | POA: Diagnosis not present

## 2019-05-09 DIAGNOSIS — E876 Hypokalemia: Secondary | ICD-10-CM | POA: Diagnosis not present

## 2019-05-09 DIAGNOSIS — Z23 Encounter for immunization: Secondary | ICD-10-CM

## 2019-05-09 DIAGNOSIS — F17200 Nicotine dependence, unspecified, uncomplicated: Secondary | ICD-10-CM | POA: Diagnosis not present

## 2019-05-09 DIAGNOSIS — E8729 Other acidosis: Secondary | ICD-10-CM | POA: Diagnosis present

## 2019-05-09 DIAGNOSIS — E872 Acidosis: Secondary | ICD-10-CM | POA: Diagnosis not present

## 2019-05-09 DIAGNOSIS — E119 Type 2 diabetes mellitus without complications: Secondary | ICD-10-CM | POA: Diagnosis present

## 2019-05-09 DIAGNOSIS — K219 Gastro-esophageal reflux disease without esophagitis: Secondary | ICD-10-CM | POA: Diagnosis present

## 2019-05-09 DIAGNOSIS — Z79899 Other long term (current) drug therapy: Secondary | ICD-10-CM

## 2019-05-09 DIAGNOSIS — R062 Wheezing: Secondary | ICD-10-CM | POA: Diagnosis not present

## 2019-05-09 DIAGNOSIS — R0602 Shortness of breath: Secondary | ICD-10-CM | POA: Diagnosis not present

## 2019-05-09 DIAGNOSIS — I6523 Occlusion and stenosis of bilateral carotid arteries: Secondary | ICD-10-CM | POA: Diagnosis not present

## 2019-05-09 DIAGNOSIS — F10239 Alcohol dependence with withdrawal, unspecified: Secondary | ICD-10-CM | POA: Diagnosis not present

## 2019-05-09 DIAGNOSIS — F1010 Alcohol abuse, uncomplicated: Secondary | ICD-10-CM | POA: Diagnosis not present

## 2019-05-09 HISTORY — DX: Other acidosis: E87.29

## 2019-05-09 HISTORY — DX: Gastro-esophageal reflux disease without esophagitis: K21.9

## 2019-05-09 HISTORY — DX: Essential (primary) hypertension: I10

## 2019-05-09 HISTORY — DX: Type 2 diabetes mellitus without complications: E11.9

## 2019-05-09 LAB — BASIC METABOLIC PANEL
Anion gap: 11 (ref 5–15)
Anion gap: 6 (ref 5–15)
Anion gap: 9 (ref 5–15)
BUN: 8 mg/dL (ref 8–23)
BUN: 6 mg/dL — ABNORMAL LOW (ref 8–23)
BUN: 5 mg/dL — ABNORMAL LOW (ref 8–23)
CO2: 26 mmol/L (ref 22–32)
CO2: 26 mmol/L (ref 22–32)
CO2: 24 mmol/L (ref 22–32)
Calcium: 8.4 mg/dL — ABNORMAL LOW (ref 8.9–10.3)
Calcium: 8.3 mg/dL — ABNORMAL LOW (ref 8.9–10.3)
Calcium: 8.6 mg/dL — ABNORMAL LOW (ref 8.9–10.3)
Chloride: 95 mmol/L — ABNORMAL LOW (ref 98–111)
Chloride: 101 mmol/L (ref 98–111)
Chloride: 99 mmol/L (ref 98–111)
Creatinine, Ser: 0.7 mg/dL (ref 0.61–1.24)
Creatinine, Ser: 0.69 mg/dL (ref 0.61–1.24)
Creatinine, Ser: 0.71 mg/dL (ref 0.61–1.24)
GFR calc Af Amer: >60 mL/min (ref >60)
GFR calc Af Amer: >60 mL/min (ref >60)
GFR calc Af Amer: >60 mL/min (ref >60)
GFR calc non Af Amer: >60 mL/min (ref >60)
GFR calc non Af Amer: >60 mL/min (ref >60)
GFR calc non Af Amer: >60 mL/min (ref >60)
Glucose, Bld: 109 mg/dL — ABNORMAL HIGH (ref 70–99)
Glucose, Bld: 71 mg/dL (ref 70–99)
Glucose, Bld: 185 mg/dL — ABNORMAL HIGH (ref 70–99)
Potassium: 2.8 mmol/L — ABNORMAL LOW (ref 3.5–5.1)
Potassium: 4.1 mmol/L (ref 3.5–5.1)
Potassium: 4.2 mmol/L (ref 3.5–5.1)
Sodium: 132 mmol/L — ABNORMAL LOW (ref 135–145)
Sodium: 133 mmol/L — ABNORMAL LOW (ref 135–145)
Sodium: 132 mmol/L — ABNORMAL LOW (ref 135–145)

## 2019-05-09 LAB — ETHANOL: Alcohol, Ethyl (B): <10 mg/dL (ref <10)

## 2019-05-09 LAB — CBC WITH DIFFERENTIAL/PLATELET
Abs Immature Granulocytes: 0.14 10*3/uL — ABNORMAL HIGH (ref 0.00–0.07)
Basophils Absolute: 0 10*3/uL (ref 0.0–0.1)
Basophils Relative: 0 %
Eosinophils Absolute: 0 10*3/uL (ref 0.0–0.5)
Eosinophils Relative: 0 %
HCT: 39.6 % (ref 39.0–52.0)
Hemoglobin: 13.9 g/dL (ref 13.0–17.0)
Immature Granulocytes: 1 %
Lymphocytes Relative: 24 %
Lymphs Abs: 2.3 10*3/uL (ref 0.7–4.0)
MCH: 31.7 pg (ref 26.0–34.0)
MCHC: 35.1 g/dL (ref 30.0–36.0)
MCV: 90.2 fL (ref 80.0–100.0)
Monocytes Absolute: 0.8 10*3/uL (ref 0.1–1.0)
Monocytes Relative: 9 %
Neutro Abs: 6.4 10*3/uL (ref 1.7–7.7)
Neutrophils Relative %: 66 %
Platelets: 222 10*3/uL (ref 150–400)
RBC: 4.39 MIL/uL (ref 4.22–5.81)
RDW: 13.2 % (ref 11.5–15.5)
WBC: 9.7 10*3/uL (ref 4.0–10.5)
nRBC: 0 % (ref 0.0–0.2)

## 2019-05-09 LAB — GLUCOSE, CAPILLARY
Glucose-Capillary: 101 mg/dL — ABNORMAL HIGH (ref 70–99)
Glucose-Capillary: 92 mg/dL (ref 70–99)
Glucose-Capillary: 116 mg/dL — ABNORMAL HIGH (ref 70–99)
Glucose-Capillary: 78 mg/dL (ref 70–99)
Glucose-Capillary: 34 mg/dL — CL (ref 70–99)
Glucose-Capillary: 37 mg/dL — CL (ref 70–99)
Glucose-Capillary: 40 mg/dL — CL (ref 70–99)
Glucose-Capillary: 46 mg/dL — ABNORMAL LOW (ref 70–99)
Glucose-Capillary: 126 mg/dL — ABNORMAL HIGH (ref 70–99)
Glucose-Capillary: 177 mg/dL — ABNORMAL HIGH (ref 70–99)

## 2019-05-09 LAB — CBC
HCT: 35.6 % — ABNORMAL LOW (ref 39.0–52.0)
Hemoglobin: 12.5 g/dL — ABNORMAL LOW (ref 13.0–17.0)
MCH: 32.1 pg (ref 26.0–34.0)
MCHC: 35.1 g/dL (ref 30.0–36.0)
MCV: 91.5 fL (ref 80.0–100.0)
Platelets: 201 10*3/uL (ref 150–400)
RBC: 3.89 MIL/uL — ABNORMAL LOW (ref 4.22–5.81)
RDW: 13.5 % (ref 11.5–15.5)
WBC: 8.3 10*3/uL (ref 4.0–10.5)
nRBC: 0 % (ref 0.0–0.2)

## 2019-05-09 LAB — SARS CORONAVIRUS 2 BY RT PCR (HOSPITAL ORDER, PERFORMED IN ~~LOC~~ HOSPITAL LAB): SARS Coronavirus 2: NEGATIVE

## 2019-05-09 LAB — COMPREHENSIVE METABOLIC PANEL
ALT: 40 U/L (ref 0–44)
ALT: 30 U/L (ref 0–44)
AST: 34 U/L (ref 15–41)
AST: 20 U/L (ref 15–41)
Albumin: 4.4 g/dL (ref 3.5–5.0)
Albumin: 3.6 g/dL (ref 3.5–5.0)
Alkaline Phosphatase: 48 U/L (ref 38–126)
Alkaline Phosphatase: 39 U/L (ref 38–126)
Anion gap: 20 — ABNORMAL HIGH (ref 5–15)
Anion gap: 10 (ref 5–15)
BUN: 11 mg/dL (ref 8–23)
BUN: 6 mg/dL — ABNORMAL LOW (ref 8–23)
CO2: 21 mmol/L — ABNORMAL LOW (ref 22–32)
CO2: 26 mmol/L (ref 22–32)
Calcium: 9.1 mg/dL (ref 8.9–10.3)
Calcium: 8.2 mg/dL — ABNORMAL LOW (ref 8.9–10.3)
Chloride: 88 mmol/L — ABNORMAL LOW (ref 98–111)
Chloride: 97 mmol/L — ABNORMAL LOW (ref 98–111)
Creatinine, Ser: 0.92 mg/dL (ref 0.61–1.24)
Creatinine, Ser: 0.57 mg/dL — ABNORMAL LOW (ref 0.61–1.24)
GFR calc Af Amer: >60 mL/min (ref >60)
GFR calc Af Amer: >60 mL/min (ref >60)
GFR calc non Af Amer: >60 mL/min (ref >60)
GFR calc non Af Amer: >60 mL/min (ref >60)
Glucose, Bld: 169 mg/dL — ABNORMAL HIGH (ref 70–99)
Glucose, Bld: 100 mg/dL — ABNORMAL HIGH (ref 70–99)
Potassium: 3.2 mmol/L — ABNORMAL LOW (ref 3.5–5.1)
Potassium: 3.3 mmol/L — ABNORMAL LOW (ref 3.5–5.1)
Sodium: 129 mmol/L — ABNORMAL LOW (ref 135–145)
Sodium: 133 mmol/L — ABNORMAL LOW (ref 135–145)
Total Bilirubin: 1.2 mg/dL (ref 0.3–1.2)
Total Bilirubin: 0.7 mg/dL (ref 0.3–1.2)
Total Protein: 7.1 g/dL (ref 6.5–8.1)
Total Protein: 5.8 g/dL — ABNORMAL LOW (ref 6.5–8.1)

## 2019-05-09 LAB — PHOSPHORUS: Phosphorus: 2.2 mg/dL — ABNORMAL LOW (ref 2.5–4.6)

## 2019-05-09 LAB — MAGNESIUM: Magnesium: 1.7 mg/dL (ref 1.7–2.4)

## 2019-05-09 MED ORDER — INSULIN ASPART 100 UNIT/ML ~~LOC~~ SOLN
3.0000 [IU] | Freq: Three times a day (TID) | SUBCUTANEOUS | Status: DC
  Administered 2019-05-09: 12:00:00 3 [IU] via SUBCUTANEOUS
  Filled 2019-05-09: qty 1

## 2019-05-09 MED ORDER — INSULIN REGULAR(HUMAN) IN NACL 100-0.9 UT/100ML-% IV SOLN
INTRAVENOUS | Status: DC
  Administered 2019-05-09: 08:00:00 0.3 [IU]/h via INTRAVENOUS
  Filled 2019-05-09: qty 100

## 2019-05-09 MED ORDER — POTASSIUM CHLORIDE IN NACL 40-0.9 MEQ/L-% IV SOLN
INTRAVENOUS | Status: DC
  Administered 2019-05-09 – 2019-05-10 (×3): 125 mL/h via INTRAVENOUS
  Filled 2019-05-09 (×7): qty 1000

## 2019-05-09 MED ORDER — INSULIN DETEMIR 100 UNIT/ML ~~LOC~~ SOLN
12.0000 [IU] | Freq: Every day | SUBCUTANEOUS | Status: DC
  Administered 2019-05-09: 12 [IU] via SUBCUTANEOUS
  Filled 2019-05-09 (×2): qty 0.12

## 2019-05-09 MED ORDER — LORAZEPAM 2 MG/ML IJ SOLN
0.0000 mg | Freq: Four times a day (QID) | INTRAMUSCULAR | Status: DC
  Administered 2019-05-09: 1 mg via INTRAVENOUS
  Filled 2019-05-09: qty 1

## 2019-05-09 MED ORDER — INSULIN ASPART 100 UNIT/ML ~~LOC~~ SOLN: 0.0000 [IU] | Freq: Three times a day (TID) | SUBCUTANEOUS | Status: DC

## 2019-05-09 MED ORDER — ADULT MULTIVITAMIN W/MINERALS CH
1.0000 | ORAL_TABLET | Freq: Every day | ORAL | Status: DC
  Administered 2019-05-09 – 2019-05-10 (×2): 1 via ORAL
  Filled 2019-05-09 (×2): qty 1

## 2019-05-09 MED ORDER — LORAZEPAM 2 MG/ML IJ SOLN
1.0000 mg | Freq: Once | INTRAMUSCULAR | Status: AC
  Administered 2019-05-09: 1 mg via INTRAVENOUS
  Filled 2019-05-09: qty 1

## 2019-05-09 MED ORDER — INFLUENZA VAC A&B SA ADJ QUAD 0.5 ML IM PRSY
0.5000 mL | PREFILLED_SYRINGE | INTRAMUSCULAR | Status: AC
  Administered 2019-05-10: 0.5 mL via INTRAMUSCULAR
  Filled 2019-05-09: qty 0.5

## 2019-05-09 MED ORDER — SODIUM CHLORIDE 0.9 % IV SOLN
INTRAVENOUS | Status: DC
  Administered 2019-05-09: 08:00:00 via INTRAVENOUS

## 2019-05-09 MED ORDER — VITAMIN B-1 100 MG PO TABS
100.0000 mg | ORAL_TABLET | Freq: Every day | ORAL | Status: DC
  Administered 2019-05-10: 11:00:00 100 mg via ORAL
  Filled 2019-05-09: qty 1

## 2019-05-09 MED ORDER — FOLIC ACID 1 MG PO TABS
1.0000 mg | ORAL_TABLET | Freq: Every day | ORAL | Status: DC
  Administered 2019-05-09 – 2019-05-10 (×2): 1 mg via ORAL
  Filled 2019-05-09 (×2): qty 1

## 2019-05-09 MED ORDER — DEXTROSE-NACL 5-0.45 % IV SOLN
INTRAVENOUS | Status: DC
  Administered 2019-05-09: 08:00:00 via INTRAVENOUS

## 2019-05-09 MED ORDER — LORAZEPAM 2 MG/ML IJ SOLN: 0.0000 mg | Freq: Two times a day (BID) | INTRAMUSCULAR | Status: DC

## 2019-05-09 MED ORDER — LORAZEPAM 2 MG/ML IJ SOLN: 0.0000 mg | Freq: Three times a day (TID) | INTRAMUSCULAR | Status: DC

## 2019-05-09 MED ORDER — SODIUM CHLORIDE 0.9 % IV BOLUS
1000.0000 mL | Freq: Once | INTRAVENOUS | Status: AC
  Administered 2019-05-09: 02:00:00 1000 mL via INTRAVENOUS

## 2019-05-09 MED ORDER — THIAMINE HCL 100 MG/ML IJ SOLN: 100.0000 mg | Freq: Every day | INTRAMUSCULAR | Status: DC

## 2019-05-09 MED ORDER — CHLORTHALIDONE 25 MG PO TABS
25.0000 mg | ORAL_TABLET | Freq: Every day | ORAL | Status: DC
  Administered 2019-05-09 – 2019-05-10 (×2): 25 mg via ORAL
  Filled 2019-05-09 (×3): qty 1

## 2019-05-09 MED ORDER — ATENOLOL 100 MG PO TABS
100.0000 mg | ORAL_TABLET | Freq: Every day | ORAL | Status: DC
  Administered 2019-05-09 – 2019-05-10 (×2): 100 mg via ORAL
  Filled 2019-05-09 (×2): qty 1

## 2019-05-09 MED ORDER — SODIUM CHLORIDE 0.9 % IV SOLN
INTRAVENOUS | Status: DC
  Administered 2019-05-09: 07:00:00 via INTRAVENOUS

## 2019-05-09 MED ORDER — INSULIN ASPART 100 UNIT/ML ~~LOC~~ SOLN: 0.0000 [IU] | Freq: Every day | SUBCUTANEOUS | Status: DC

## 2019-05-09 MED ORDER — POTASSIUM CHLORIDE CRYS ER 20 MEQ PO TBCR
40.0000 meq | EXTENDED_RELEASE_TABLET | ORAL | Status: AC
  Administered 2019-05-09: 08:00:00 40 meq via ORAL
  Filled 2019-05-09: qty 2

## 2019-05-09 MED ORDER — PANTOPRAZOLE SODIUM 40 MG PO TBEC
40.0000 mg | DELAYED_RELEASE_TABLET | Freq: Every day | ORAL | Status: DC
  Administered 2019-05-09: 11:00:00 40 mg via ORAL
  Filled 2019-05-09 (×2): qty 1

## 2019-05-09 MED ORDER — IOHEXOL 350 MG/ML SOLN
75.0000 mL | Freq: Once | INTRAVENOUS | Status: AC | PRN
  Administered 2019-05-09: 75 mL via INTRAVENOUS

## 2019-05-09 MED ORDER — LORAZEPAM 2 MG/ML IJ SOLN: 1.0000 mg | INTRAMUSCULAR | Status: DC | PRN

## 2019-05-09 MED ORDER — LORAZEPAM 2 MG PO TABS: 0.0000 mg | ORAL_TABLET | Freq: Two times a day (BID) | ORAL | Status: DC

## 2019-05-09 MED ORDER — VITAMIN B-1 100 MG PO TABS
100.0000 mg | ORAL_TABLET | Freq: Every day | ORAL | Status: DC
  Administered 2019-05-09: 11:00:00 100 mg via ORAL
  Filled 2019-05-09: qty 1

## 2019-05-09 MED ORDER — POTASSIUM PHOSPHATE MONOBASIC 500 MG PO TABS
500.0000 mg | ORAL_TABLET | Freq: Three times a day (TID) | ORAL | Status: AC
  Administered 2019-05-09 (×2): 500 mg via ORAL
  Filled 2019-05-09 (×2): qty 1

## 2019-05-09 MED ORDER — LORAZEPAM 1 MG PO TABS
1.0000 mg | ORAL_TABLET | ORAL | Status: DC | PRN
  Administered 2019-05-09: 1 mg via ORAL
  Filled 2019-05-09: qty 1

## 2019-05-09 MED ORDER — POTASSIUM CHLORIDE 10 MEQ/100ML IV SOLN
10.0000 meq | INTRAVENOUS | Status: AC
  Administered 2019-05-09 (×4): 10 meq via INTRAVENOUS
  Filled 2019-05-09 (×6): qty 100

## 2019-05-09 MED ORDER — DEXTROSE 50 % IV SOLN
1.0000 | Freq: Once | INTRAVENOUS | Status: AC
  Administered 2019-05-09: 50 mL via INTRAVENOUS

## 2019-05-09 MED ORDER — DEXTROSE 50 % IV SOLN
INTRAVENOUS | Status: AC
  Filled 2019-05-09: qty 50

## 2019-05-09 MED ORDER — ATENOLOL-CHLORTHALIDONE 100-25 MG PO TABS: 1.0000 | ORAL_TABLET | Freq: Every day | ORAL | Status: DC

## 2019-05-09 MED ORDER — LORAZEPAM 2 MG PO TABS: 0.0000 mg | ORAL_TABLET | Freq: Four times a day (QID) | ORAL | Status: DC

## 2019-05-09 MED ORDER — LORAZEPAM 2 MG/ML IJ SOLN
0.0000 mg | INTRAMUSCULAR | Status: DC
  Administered 2019-05-09: 1 mg via INTRAVENOUS
  Filled 2019-05-09: qty 1

## 2019-05-09 MED ORDER — ENOXAPARIN SODIUM 40 MG/0.4ML ~~LOC~~ SOLN
40.0000 mg | SUBCUTANEOUS | Status: DC
  Administered 2019-05-09 – 2019-05-10 (×2): 40 mg via SUBCUTANEOUS
  Filled 2019-05-09 (×2): qty 0.4

## 2019-05-09 NOTE — ED Notes (Signed)
ED TO INPATIENT HANDOFF REPORT  ED Nurse Name and Phone #: Daiva Nakayama, [place] Name/Age/Gender Derrick Jones 73 y.o. male Room/Bed: ED13A/ED13A  Code Status   Code Status: Full Code  Home/SNF/Other Home Patient oriented to: self, place, time and situation Is this baseline? Yes   Triage Complete: Triage complete  Chief Complaint Poss Seizure  Triage Note Pt arrives to ED via [facility] from home with c/o seizure-like activity. Per EMS, pt has no seizure h/x. Pt did not loose bowel or bladder control, no post-ictal state. Pt states he remembers "jerking real bad"; does not appear to have any LOC. Pt artrives A&O, in NAD; RR even, regular, and unlabored. Dr Owens Shark at bedside upon pt's arrival to ED.   Allergies No Known Allergies  Level of Care/Admitting Diagnosis ED Disposition    ED Disposition Condition [facility]: [place] [100120]  Level of Care: Stepdown [14]  Covid Evaluation: Asymptomatic Screening Protocol (No Symptoms)  Diagnosis: Alcoholic ketoacidosis [id number]  Admitting Physician: Harrie Foreman [id number]  Attending Physician: Harrie Foreman [id number]  Estimated length of stay: past midnight tomorrow  Certification:: I certify this patient will need inpatient services for at least 2 midnights  PT Class (Do Not Modify): Inpatient [101]  PT Acc Code (Do Not Modify): Private [1]       B Medical/Surgery History Past Medical History:  Diagnosis Date  . Diabetes mellitus without complication ([place])   . GERD (gastroesophageal reflux disease)   . Hypertension    Past Surgical History:  Procedure Laterality Date  . EYE SURGERY    . FEMUR FRACTURE SURGERY Left      A IV Location/Drains/Wounds Patient Lines/Drains/Airways Status   Active Line/Drains/Airways    Name:   Placement date:   Placement time:   Site:   Days:   Peripheral IV 05/09/19 Left Forearm   05/09/19    0037    Forearm   less than 1           Intake/Output Last 24 hours  Intake/Output Summary (Last 24 hours) at 05/09/2019 0335 Last data filed at 05/09/2019 0307 Gross per 24 hour  Intake 1000 ml  Output -  Net 1000 ml    Labs/Imaging Results for orders placed or performed during the hospital encounter of 05/09/19 (from the past 48 hour(s))  CBC WITH DIFFERENTIAL     Status: Abnormal   Collection Time: 05/09/19 12:32 AM  Result Value Ref Range   WBC 9.7 4.0 - 10.5 K/uL   RBC 4.39 4.22 - 5.81 MIL/uL   Hemoglobin 13.9 13.0 - 17.0 g/dL   HCT 39.6 39.0 - 52.0 %   MCV 90.2 80.0 - 100.0 fL   MCH 31.7 26.0 - 34.0 pg   MCHC 35.1 30.0 - 36.0 g/dL   RDW 13.2 11.5 - 15.5 %   Platelets 222 150 - 400 K/uL   nRBC 0.0 0.0 - 0.2 %   Neutrophils Relative % 66 %   Neutro Abs 6.4 1.7 - 7.7 K/uL   Lymphocytes Relative 24 %   Lymphs Abs 2.3 0.7 - 4.0 K/uL   Monocytes Relative 9 %   Monocytes Absolute 0.8 0.1 - 1.0 K/uL   Eosinophils Relative 0 %   Eosinophils Absolute 0.0 0.0 - 0.5 K/uL   Basophils Relative 0 %   Basophils Absolute 0.0 0.0 - 0.1 K/uL   Immature Granulocytes 1 %   Abs Immature Granulocytes 0.14 (H) 0.00 -  0.07 K/uL    Comment: Performed at [facility], [address] [place], [place] [address]  Comprehensive metabolic panel     Status: Abnormal   Collection Time: 05/09/19 12:32 AM  Result Value Ref Range   Sodium 129 (L) 135 - 145 mmol/L   Potassium 3.2 (L) 3.5 - 5.1 mmol/L    Comment: HEMOLYSIS AT THIS LEVEL MAY AFFECT RESULT   Chloride 88 (L) 98 - 111 mmol/L   CO2 21 (L) 22 - 32 mmol/L   Glucose, Bld 169 (H) 70 - 99 mg/dL   BUN 11 8 - 23 mg/dL   Creatinine, Ser 0.92 0.61 - 1.24 mg/dL   Calcium 9.1 8.9 - 10.3 mg/dL   Total Protein 7.1 6.5 - 8.1 g/dL   Albumin 4.4 3.5 - 5.0 g/dL   AST 34 15 - 41 U/L   ALT 40 0 - 44 U/L   Alkaline Phosphatase 48 38 - 126 U/L   Total Bilirubin 1.2 0.3 - 1.2 mg/dL   GFR calc non Af Amer >60 >60 mL/min   GFR calc Af Amer >60 >60 mL/min   Anion gap 20 (H) 5 -  15    Comment: Performed at [facility], [address], [place], [place] [address]  Ethanol     Status: None   Collection Time: 05/09/19 12:32 AM  Result Value Ref Range   Alcohol, Ethyl (B) <10 <10 mg/dL    Comment: (NOTE) Lowest detectable limit for serum alcohol is 10 mg/dL. For medical purposes only. Performed at [facility], [place], [place] [address]    Ct Angio Head W Or Wo Contrast  Result Date: 05/09/2019 CLINICAL DATA:  Initial evaluation for acute seizure. EXAM: CT ANGIOGRAPHY HEAD AND NECK TECHNIQUE: Multidetector CT imaging of the head and neck was performed using the standard protocol during bolus administration of intravenous contrast. Multiplanar CT image reconstructions and MIPs were obtained to evaluate the vascular anatomy. Carotid stenosis measurements (when applicable) are obtained utilizing NASCET criteria, using the distal internal carotid diameter as the denominator. CONTRAST:  58mL OMNIPAQUE IOHEXOL 350 MG/ML SOLN COMPARISON:  None. FINDINGS: CT HEAD FINDINGS Brain: Age-related cerebral atrophy with moderate chronic small vessel ischemic disease. No evidence for acute intracranial hemorrhage. No findings to suggest acute large vessel territory infarct. No mass lesion, midline shift, or mass effect. Ventricles are normal in size without evidence for hydrocephalus. No extra-axial fluid collection identified. Vascular: No hyperdense vessel identified.Scattered vascular calcifications noted within the carotid siphons. Skull: Scalp soft tissues demonstrate no acute abnormality. Calvarium intact. Sinuses/Orbits: Globes and orbital soft tissues within normal limits. Visualized paranasal sinuses are clear. No mastoid effusion. CTA NECK FINDINGS Aortic arch: Visualized aortic arch of normal caliber with normal branch pattern. Mild atheromatous plaque noted within the arch itself. No hemodynamically significant stenosis seen about the origin of the  great vessels. Visible subclavian arteries widely patent. Right carotid system: Right CCA tortuous proximally but patent to the bifurcation without stenosis. Mixed plaque about the right bifurcation/proximal right ICA with mild 25% stenosis by NASCET criteria. Right ICA otherwise widely patent distally to the skull base without stenosis, dissection, or occlusion. Left carotid system: Left CCA patent from its origin to the bifurcation without stenosis. Eccentric mixed plaque at the origin of the left ICA with up to 50% stenosis by NASCET criteria. Left ICA patent distally to the skull base without additional stenosis, dissection, or occlusion. Evaluation mildly limited by motion. Vertebral arteries: Both vertebral arteries  arise from the subclavian arteries. Atheromatous plaque at the origin of the vertebral arteries with approximate 50% stenosis bilaterally. No made of a focal penetrating plaque at the proximal right V1 segment just distal to the origin (series 11, image 85). Multifocal mild to moderate narrowing about the proximal V2 segments related to adjacent uncovertebral disease. Vertebral arteries otherwise patent within the neck without hemodynamically significant stenosis or other acute vascular abnormality. Skeleton: No acute osseous finding. Chronic fracture about the right C3-4 facet noted. Conchae cavity at the superior endplate of T4 also chronic in appearance. No discrete osseous lesions. Moderate to advanced cervical spondylolysis at C4-5 through C6-7. Other neck: No other acute soft tissue abnormality within the neck. Upper chest: Layering secretions noted within the subglottic trachea near the carina, suggesting at this patient may be risk for aspiration. Mild upper lobe centrilobular emphysema. Review of the MIP images confirms the above findings CTA HEAD FINDINGS Anterior circulation: Petrous segments widely patent. Mild scattered atheromatous plaque within the cavernous/supraclinoid ICAs without  hemodynamically significant stenosis. A1 segments patent bilaterally. 3 mm focal outpouching arising from the anterior communicating artery suspicious for a small aneurysm. Anterior cerebral arteries widely patent to their distal aspects without stenosis. No M1 stenosis or occlusion. Negative MCA bifurcations. Distal MCA branches well perfused and symmetric. Posterior circulation: Right vertebral artery widely patent to the vertebrobasilar junction. Focal atheromatous plaque within the proximal left V4 segment with short-segment moderate approximate 50% stenosis. Left V4 otherwise widely patent. Patent right PICA. Left PICA not seen. Basilar widely patent to its distal aspect. Superior cerebral arteries patent bilaterally. Both of the posterior cerebral arteries primarily supplied via the basilar well perfused to their distal aspects. Venous sinuses: Patent. Anatomic variants: None significant. Review of the MIP images confirms the above findings IMPRESSION: 1. Negative CTA for emergent large vessel occlusion. 2. Atheromatous plaque about the carotid bifurcations bilaterally with associated stenosis of up to 50% on the left and 25% on the right. 3. Moderate approximate 50% stenoses about the origins of the vertebral arteries bilaterally, with additional short-segment 50% left V4 stenosis. 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 5. Layering secretions within the subglottic trachea near the carina, suggesting that this patient may be at risk for aspiration. 6. Emphysema. 7. Chronic fracture about the right C3-4 facet. Electronically Signed   By: Jeannine Boga M.D.   On: 05/09/2019 01:55   Ct Angio Neck W And/or Wo Contrast  Result Date: 05/09/2019 CLINICAL DATA:  Initial evaluation for acute seizure. EXAM: CT ANGIOGRAPHY HEAD AND NECK TECHNIQUE: Multidetector CT imaging of the head and neck was performed using the standard protocol during bolus administration of intravenous contrast. Multiplanar CT  image reconstructions and MIPs were obtained to evaluate the vascular anatomy. Carotid stenosis measurements (when applicable) are obtained utilizing NASCET criteria, using the distal internal carotid diameter as the denominator. CONTRAST:  62mL OMNIPAQUE IOHEXOL 350 MG/ML SOLN COMPARISON:  None. FINDINGS: CT HEAD FINDINGS Brain: Age-related cerebral atrophy with moderate chronic small vessel ischemic disease. No evidence for acute intracranial hemorrhage. No findings to suggest acute large vessel territory infarct. No mass lesion, midline shift, or mass effect. Ventricles are normal in size without evidence for hydrocephalus. No extra-axial fluid collection identified. Vascular: No hyperdense vessel identified.Scattered vascular calcifications noted within the carotid siphons. Skull: Scalp soft tissues demonstrate no acute abnormality. Calvarium intact. Sinuses/Orbits: Globes and orbital soft tissues within normal limits. Visualized paranasal sinuses are clear. No mastoid effusion. CTA NECK FINDINGS Aortic arch: Visualized aortic arch  of normal caliber with normal branch pattern. Mild atheromatous plaque noted within the arch itself. No hemodynamically significant stenosis seen about the origin of the great vessels. Visible subclavian arteries widely patent. Right carotid system: Right CCA tortuous proximally but patent to the bifurcation without stenosis. Mixed plaque about the right bifurcation/proximal right ICA with mild 25% stenosis by NASCET criteria. Right ICA otherwise widely patent distally to the skull base without stenosis, dissection, or occlusion. Left carotid system: Left CCA patent from its origin to the bifurcation without stenosis. Eccentric mixed plaque at the origin of the left ICA with up to 50% stenosis by NASCET criteria. Left ICA patent distally to the skull base without additional stenosis, dissection, or occlusion. Evaluation mildly limited by motion. Vertebral arteries: Both vertebral  arteries arise from the subclavian arteries. Atheromatous plaque at the origin of the vertebral arteries with approximate 50% stenosis bilaterally. No made of a focal penetrating plaque at the proximal right V1 segment just distal to the origin (series 11, image 85). Multifocal mild to moderate narrowing about the proximal V2 segments related to adjacent uncovertebral disease. Vertebral arteries otherwise patent within the neck without hemodynamically significant stenosis or other acute vascular abnormality. Skeleton: No acute osseous finding. Chronic fracture about the right C3-4 facet noted. Conchae cavity at the superior endplate of T4 also chronic in appearance. No discrete osseous lesions. Moderate to advanced cervical spondylolysis at C4-5 through C6-7. Other neck: No other acute soft tissue abnormality within the neck. Upper chest: Layering secretions noted within the subglottic trachea near the carina, suggesting at this patient may be risk for aspiration. Mild upper lobe centrilobular emphysema. Review of the MIP images confirms the above findings CTA HEAD FINDINGS Anterior circulation: Petrous segments widely patent. Mild scattered atheromatous plaque within the cavernous/supraclinoid ICAs without hemodynamically significant stenosis. A1 segments patent bilaterally. 3 mm focal outpouching arising from the anterior communicating artery suspicious for a small aneurysm. Anterior cerebral arteries widely patent to their distal aspects without stenosis. No M1 stenosis or occlusion. Negative MCA bifurcations. Distal MCA branches well perfused and symmetric. Posterior circulation: Right vertebral artery widely patent to the vertebrobasilar junction. Focal atheromatous plaque within the proximal left V4 segment with short-segment moderate approximate 50% stenosis. Left V4 otherwise widely patent. Patent right PICA. Left PICA not seen. Basilar widely patent to its distal aspect. Superior cerebral arteries patent  bilaterally. Both of the posterior cerebral arteries primarily supplied via the basilar well perfused to their distal aspects. Venous sinuses: Patent. Anatomic variants: None significant. Review of the MIP images confirms the above findings IMPRESSION: 1. Negative CTA for emergent large vessel occlusion. 2. Atheromatous plaque about the carotid bifurcations bilaterally with associated stenosis of up to 50% on the left and 25% on the right. 3. Moderate approximate 50% stenoses about the origins of the vertebral arteries bilaterally, with additional short-segment 50% left V4 stenosis. 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 5. Layering secretions within the subglottic trachea near the carina, suggesting that this patient may be at risk for aspiration. 6. Emphysema. 7. Chronic fracture about the right C3-4 facet. Electronically Signed   By: Jeannine Boga M.D.   On: 05/09/2019 01:55    Pending Labs [employer] (From admission, onward)    Start     Ordered   Signed and Corporate treasurer  STAT Now then every 4 hours ,   STAT     Signed and Held   Signed and Held  Hemoglobin A1c  Once,  R    Comments: To assess prior glycemic control.    Signed and Held   Signed and Held  Creatinine, serum  (enoxaparin (LOVENOX)    CrCl >/= 30 ml/min)  Weekly,   R    Comments: while on enoxaparin therapy    Signed and Held   Signed and Held  Comprehensive metabolic panel  Once,   R     Signed and Held   Signed and Held  Magnesium  Once,   R     Signed and Held   Signed and Held  Phosphorus  Once,   R     Signed and Held   Signed and Held  CBC  Once,   R     Signed and Held          Vitals/Pain Today's Vitals   05/09/19 0154 05/09/19 0159 05/09/19 0230 05/09/19 0308  BP: (!) 149/87 (!) 149/87 130/84 134/79  Pulse: 94 85 75 82  Resp: 20  20 19   Temp:      TempSrc:      SpO2: 96%  98% 98%  Weight:      Height:      PainSc: 0-No pain   0-No pain    Isolation  Precautions No active isolations  Medications Medications  LORazepam (ATIVAN) injection 0-4 mg (0 mg Intravenous Not Given 05/09/19 0201)    Or  LORazepam (ATIVAN) tablet 0-4 mg ( Oral See Alternative 05/09/19 0201)  LORazepam (ATIVAN) injection 0-4 mg (has no administration in time range)    Or  LORazepam (ATIVAN) tablet 0-4 mg (has no administration in time range)  thiamine (VITAMIN B-1) tablet 100 mg (has no administration in time range)    Or  thiamine (B-1) injection 100 mg (has no administration in time range)  LORazepam (ATIVAN) injection 1 mg (1 mg Intravenous Given 05/09/19 0111)  iohexol (OMNIPAQUE) 350 MG/ML injection 75 mL (75 mLs Intravenous Contrast Given 05/09/19 0057)  sodium chloride 0.9 % bolus 1,000 mL (0 mLs Intravenous Stopped 05/09/19 0307)    Mobility walks Low fall risk   Focused Assessments    R Recommendations: See Admitting Provider Note  Report given to:   Additional Notes: None

## 2019-05-09 NOTE — H&P (Signed)
Derrick Jones is an 73 y.o. male.   Chief Complaint: Seizures HPI: The patient with past medical history of diabetes and hypertension presents to the emergency department due to seizure-like activity.  The patient admits to loss of consciousness and his wife adds that he began to shake.  It is unclear how long the seizure may have lasted.  The patient has a history of alcohol abuse but stopped drinking for 10 years.  Then he recently drank "at least half a gallon of liquor".  Last drink was last week.  The patient received Ativan in the emergency department.  Head CT was negative for acute intracranial abnormality.  Laboratory evaluation significant for increased anion gap.  Glucose was barely elevated indicating ketoacidosis more consistent with alcohol intake and diabetes.  The patient received intravenous fluid prior to the emergency department staff calling the hospitalist service for admission.  Past Medical History:  Diagnosis Date  . Diabetes mellitus without complication ([place])   . GERD (gastroesophageal reflux disease)   . Hypertension     Past Surgical History:  Procedure Laterality Date  . EYE SURGERY    . FEMUR FRACTURE SURGERY Left     Family History  Problem Relation Age of Onset  . Breast cancer Sister   . Ovarian cancer Sister    Social History:  reports that he has been smoking. He has never used smokeless tobacco. He reports that he does not drink alcohol or use drugs.  Allergies: No Known Allergies  Prior to Admission medications   Medication Sig Start Date End Date Taking? Authorizing Provider  atenolol-chlorthalidone (TENORETIC) 100-25 MG tablet Take 1 tablet by mouth daily.   Yes [provider]  metFORMIN (GLUCOPHAGE) 500 MG tablet Take 500 mg by mouth 2 (two) times daily.   Yes [provider]  omeprazole (PRILOSEC) 20 MG capsule Take 20 mg by mouth daily.   Yes [provider]    Results for orders placed or performed during the  hospital encounter of 05/09/19 (from the past 48 hour(s))  CBC WITH DIFFERENTIAL     Status: Abnormal   Collection Time: 05/09/19 12:32 AM  Result Value Ref Range   WBC 9.7 4.0 - 10.5 K/uL   RBC 4.39 4.22 - 5.81 MIL/uL   Hemoglobin 13.9 13.0 - 17.0 g/dL   HCT 39.6 39.0 - 52.0 %   MCV 90.2 80.0 - 100.0 fL   MCH 31.7 26.0 - 34.0 pg   MCHC 35.1 30.0 - 36.0 g/dL   RDW 13.2 11.5 - 15.5 %   Platelets 222 150 - 400 K/uL   nRBC 0.0 0.0 - 0.2 %   Neutrophils Relative % 66 %   Neutro Abs 6.4 1.7 - 7.7 K/uL   Lymphocytes Relative 24 %   Lymphs Abs 2.3 0.7 - 4.0 K/uL   Monocytes Relative 9 %   Monocytes Absolute 0.8 0.1 - 1.0 K/uL   Eosinophils Relative 0 %   Eosinophils Absolute 0.0 0.0 - 0.5 K/uL   Basophils Relative 0 %   Basophils Absolute 0.0 0.0 - 0.1 K/uL   Immature Granulocytes 1 %   Abs Immature Granulocytes 0.14 (H) 0.00 - 0.07 K/uL    Comment: Performed at [facility], [address] [place], [place] [address]  Comprehensive metabolic panel     Status: Abnormal   Collection Time: 05/09/19 12:32 AM  Result Value Ref Range   Sodium 129 (L) 135 - 145 mmol/L   Potassium 3.2 (L) 3.5 -  5.1 mmol/L    Comment: HEMOLYSIS AT THIS LEVEL MAY AFFECT RESULT   Chloride 88 (L) 98 - 111 mmol/L   CO2 21 (L) 22 - 32 mmol/L   Glucose, Bld 169 (H) 70 - 99 mg/dL   BUN 11 8 - 23 mg/dL   Creatinine, Ser 0.92 0.61 - 1.24 mg/dL   Calcium 9.1 8.9 - 10.3 mg/dL   Total Protein 7.1 6.5 - 8.1 g/dL   Albumin 4.4 3.5 - 5.0 g/dL   AST 34 15 - 41 U/L   ALT 40 0 - 44 U/L   Alkaline Phosphatase 48 38 - 126 U/L   Total Bilirubin 1.2 0.3 - 1.2 mg/dL   GFR calc non Af Amer >60 >60 mL/min   GFR calc Af Amer >60 >60 mL/min   Anion gap 20 (H) 5 - 15    Comment: Performed at [facility], [address] [place], [place] [address]  Ethanol     Status: None   Collection Time: 05/09/19 12:32 AM  Result Value Ref Range   Alcohol, Ethyl (B) <10 <10 mg/dL    Comment: (NOTE) Lowest  detectable limit for serum alcohol is 10 mg/dL. For medical purposes only. Performed at [facility], [place], [place] [address]    Ct Angio Head W Or Wo Contrast  Result Date: 05/09/2019 CLINICAL DATA:  Initial evaluation for acute seizure. EXAM: CT ANGIOGRAPHY HEAD AND NECK TECHNIQUE: Multidetector CT imaging of the head and neck was performed using the standard protocol during bolus administration of intravenous contrast. Multiplanar CT image reconstructions and MIPs were obtained to evaluate the vascular anatomy. Carotid stenosis measurements (when applicable) are obtained utilizing NASCET criteria, using the distal internal carotid diameter as the denominator. CONTRAST:  41mL OMNIPAQUE IOHEXOL 350 MG/ML SOLN COMPARISON:  None. FINDINGS: CT HEAD FINDINGS Brain: Age-related cerebral atrophy with moderate chronic small vessel ischemic disease. No evidence for acute intracranial hemorrhage. No findings to suggest acute large vessel territory infarct. No mass lesion, midline shift, or mass effect. Ventricles are normal in size without evidence for hydrocephalus. No extra-axial fluid collection identified. Vascular: No hyperdense vessel identified.Scattered vascular calcifications noted within the carotid siphons. Skull: Scalp soft tissues demonstrate no acute abnormality. Calvarium intact. Sinuses/Orbits: Globes and orbital soft tissues within normal limits. Visualized paranasal sinuses are clear. No mastoid effusion. CTA NECK FINDINGS Aortic arch: Visualized aortic arch of normal caliber with normal branch pattern. Mild atheromatous plaque noted within the arch itself. No hemodynamically significant stenosis seen about the origin of the great vessels. Visible subclavian arteries widely patent. Right carotid system: Right CCA tortuous proximally but patent to the bifurcation without stenosis. Mixed plaque about the right bifurcation/proximal right ICA with mild 25% stenosis by  NASCET criteria. Right ICA otherwise widely patent distally to the skull base without stenosis, dissection, or occlusion. Left carotid system: Left CCA patent from its origin to the bifurcation without stenosis. Eccentric mixed plaque at the origin of the left ICA with up to 50% stenosis by NASCET criteria. Left ICA patent distally to the skull base without additional stenosis, dissection, or occlusion. Evaluation mildly limited by motion. Vertebral arteries: Both vertebral arteries arise from the subclavian arteries. Atheromatous plaque at the origin of the vertebral arteries with approximate 50% stenosis bilaterally. No made of a focal penetrating plaque at the proximal right V1 segment just distal to the origin (series 11, image 85). Multifocal mild to moderate narrowing about the proximal V2 segments related to adjacent uncovertebral disease.  Vertebral arteries otherwise patent within the neck without hemodynamically significant stenosis or other acute vascular abnormality. Skeleton: No acute osseous finding. Chronic fracture about the right C3-4 facet noted. Conchae cavity at the superior endplate of T4 also chronic in appearance. No discrete osseous lesions. Moderate to advanced cervical spondylolysis at C4-5 through C6-7. Other neck: No other acute soft tissue abnormality within the neck. Upper chest: Layering secretions noted within the subglottic trachea near the carina, suggesting at this patient may be risk for aspiration. Mild upper lobe centrilobular emphysema. Review of the MIP images confirms the above findings CTA HEAD FINDINGS Anterior circulation: Petrous segments widely patent. Mild scattered atheromatous plaque within the cavernous/supraclinoid ICAs without hemodynamically significant stenosis. A1 segments patent bilaterally. 3 mm focal outpouching arising from the anterior communicating artery suspicious for a small aneurysm. Anterior cerebral arteries widely patent to their distal aspects  without stenosis. No M1 stenosis or occlusion. Negative MCA bifurcations. Distal MCA branches well perfused and symmetric. Posterior circulation: Right vertebral artery widely patent to the vertebrobasilar junction. Focal atheromatous plaque within the proximal left V4 segment with short-segment moderate approximate 50% stenosis. Left V4 otherwise widely patent. Patent right PICA. Left PICA not seen. Basilar widely patent to its distal aspect. Superior cerebral arteries patent bilaterally. Both of the posterior cerebral arteries primarily supplied via the basilar well perfused to their distal aspects. Venous sinuses: Patent. Anatomic variants: None significant. Review of the MIP images confirms the above findings IMPRESSION: 1. Negative CTA for emergent large vessel occlusion. 2. Atheromatous plaque about the carotid bifurcations bilaterally with associated stenosis of up to 50% on the left and 25% on the right. 3. Moderate approximate 50% stenoses about the origins of the vertebral arteries bilaterally, with additional short-segment 50% left V4 stenosis. 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 5. Layering secretions within the subglottic trachea near the carina, suggesting that this patient may be at risk for aspiration. 6. Emphysema. 7. Chronic fracture about the right C3-4 facet. Electronically Signed   By: Jeannine Boga M.D.   On: 05/09/2019 01:55   Ct Angio Neck W And/or Wo Contrast  Result Date: 05/09/2019 CLINICAL DATA:  Initial evaluation for acute seizure. EXAM: CT ANGIOGRAPHY HEAD AND NECK TECHNIQUE: Multidetector CT imaging of the head and neck was performed using the standard protocol during bolus administration of intravenous contrast. Multiplanar CT image reconstructions and MIPs were obtained to evaluate the vascular anatomy. Carotid stenosis measurements (when applicable) are obtained utilizing NASCET criteria, using the distal internal carotid diameter as the denominator.  CONTRAST:  14mL OMNIPAQUE IOHEXOL 350 MG/ML SOLN COMPARISON:  None. FINDINGS: CT HEAD FINDINGS Brain: Age-related cerebral atrophy with moderate chronic small vessel ischemic disease. No evidence for acute intracranial hemorrhage. No findings to suggest acute large vessel territory infarct. No mass lesion, midline shift, or mass effect. Ventricles are normal in size without evidence for hydrocephalus. No extra-axial fluid collection identified. Vascular: No hyperdense vessel identified.Scattered vascular calcifications noted within the carotid siphons. Skull: Scalp soft tissues demonstrate no acute abnormality. Calvarium intact. Sinuses/Orbits: Globes and orbital soft tissues within normal limits. Visualized paranasal sinuses are clear. No mastoid effusion. CTA NECK FINDINGS Aortic arch: Visualized aortic arch of normal caliber with normal branch pattern. Mild atheromatous plaque noted within the arch itself. No hemodynamically significant stenosis seen about the origin of the great vessels. Visible subclavian arteries widely patent. Right carotid system: Right CCA tortuous proximally but patent to the bifurcation without stenosis. Mixed plaque about the right bifurcation/proximal right ICA with mild  25% stenosis by NASCET criteria. Right ICA otherwise widely patent distally to the skull base without stenosis, dissection, or occlusion. Left carotid system: Left CCA patent from its origin to the bifurcation without stenosis. Eccentric mixed plaque at the origin of the left ICA with up to 50% stenosis by NASCET criteria. Left ICA patent distally to the skull base without additional stenosis, dissection, or occlusion. Evaluation mildly limited by motion. Vertebral arteries: Both vertebral arteries arise from the subclavian arteries. Atheromatous plaque at the origin of the vertebral arteries with approximate 50% stenosis bilaterally. No made of a focal penetrating plaque at the proximal right V1 segment just distal to  the origin (series 11, image 85). Multifocal mild to moderate narrowing about the proximal V2 segments related to adjacent uncovertebral disease. Vertebral arteries otherwise patent within the neck without hemodynamically significant stenosis or other acute vascular abnormality. Skeleton: No acute osseous finding. Chronic fracture about the right C3-4 facet noted. Conchae cavity at the superior endplate of T4 also chronic in appearance. No discrete osseous lesions. Moderate to advanced cervical spondylolysis at C4-5 through C6-7. Other neck: No other acute soft tissue abnormality within the neck. Upper chest: Layering secretions noted within the subglottic trachea near the carina, suggesting at this patient may be risk for aspiration. Mild upper lobe centrilobular emphysema. Review of the MIP images confirms the above findings CTA HEAD FINDINGS Anterior circulation: Petrous segments widely patent. Mild scattered atheromatous plaque within the cavernous/supraclinoid ICAs without hemodynamically significant stenosis. A1 segments patent bilaterally. 3 mm focal outpouching arising from the anterior communicating artery suspicious for a small aneurysm. Anterior cerebral arteries widely patent to their distal aspects without stenosis. No M1 stenosis or occlusion. Negative MCA bifurcations. Distal MCA branches well perfused and symmetric. Posterior circulation: Right vertebral artery widely patent to the vertebrobasilar junction. Focal atheromatous plaque within the proximal left V4 segment with short-segment moderate approximate 50% stenosis. Left V4 otherwise widely patent. Patent right PICA. Left PICA not seen. Basilar widely patent to its distal aspect. Superior cerebral arteries patent bilaterally. Both of the posterior cerebral arteries primarily supplied via the basilar well perfused to their distal aspects. Venous sinuses: Patent. Anatomic variants: None significant. Review of the MIP images confirms the above  findings IMPRESSION: 1. Negative CTA for emergent large vessel occlusion. 2. Atheromatous plaque about the carotid bifurcations bilaterally with associated stenosis of up to 50% on the left and 25% on the right. 3. Moderate approximate 50% stenoses about the origins of the vertebral arteries bilaterally, with additional short-segment 50% left V4 stenosis. 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 5. Layering secretions within the subglottic trachea near the carina, suggesting that this patient may be at risk for aspiration. 6. Emphysema. 7. Chronic fracture about the right C3-4 facet. Electronically Signed   By: Jeannine Boga M.D.   On: 05/09/2019 01:55    Review of Systems  Constitutional: Negative for chills and fever.  HENT: Negative for sore throat and tinnitus.   Eyes: Negative for blurred vision and redness.  Respiratory: Negative for cough and shortness of breath.   Cardiovascular: Negative for chest pain, palpitations, orthopnea and PND.  Gastrointestinal: Negative for abdominal pain, diarrhea, nausea and vomiting.  Genitourinary: Negative for dysuria, frequency and urgency.  Musculoskeletal: Negative for joint pain and myalgias.  Skin: Negative for rash.       No lesions  Neurological: Positive for tremors and seizures. Negative for speech change, focal weakness and weakness.  Endo/Heme/Allergies: Does not bruise/bleed easily.  No temperature intolerance  Psychiatric/Behavioral: Negative for depression and suicidal ideas.    Blood pressure 134/79, pulse 82, temperature 98.5 F (36.9 C), temperature source Oral, resp. rate 19, height 5\' 9"  (1.753 m), weight 69.4 kg, SpO2 98 %. Physical Exam  Vitals reviewed. Constitutional: He is oriented to person, place, and time. He appears well-developed and well-nourished. No distress.  HENT:  Head: Normocephalic and atraumatic.  Mouth/Throat: Oropharynx is clear and moist.  Eyes: Pupils are equal, round, and reactive to  light. Conjunctivae and EOM are normal. No scleral icterus.  Neck: Normal range of motion. Neck supple. No JVD present. No tracheal deviation present. No thyromegaly present.  Cardiovascular: Normal rate, regular rhythm and normal heart sounds. Exam reveals no gallop and no friction rub.  No murmur heard. Respiratory: Effort normal and breath sounds normal. No respiratory distress.  GI: Soft. Bowel sounds are normal. He exhibits no distension. There is no abdominal tenderness.  Genitourinary:    Genitourinary Comments: Deferred   Musculoskeletal: Normal range of motion.        General: No edema.  Lymphadenopathy:    He has no cervical adenopathy.  Neurological: He is alert and oriented to person, place, and time. No cranial nerve deficit.  Skin: Skin is warm and dry. No rash noted. No erythema.  Psychiatric: He has a normal mood and affect. His behavior is normal. Judgment and thought content normal.     Assessment/Plan This is a 73 year old male admitted for call induced ketoacidosis. 1.  Ketoacidosis: Secondary to excessive alcohol intake.  Hydrate with intravenous fluid while administering insulin drip.  Once gap closes administer basal insulin and allow the patient to eat. Replete potassium.  Manage nausea symptomatically. 2.  Alcohol abuse: The patient is very tearful about resuming drinking.  Social work consult placed.  CIWA protocol.  Monitor for seizures.  Normal saline to replete sodium.  Continue thiamine supplementation 3.  Diabetes mellitus type 2: Hold oral hypoglycemic agents.  Sliding scale insulin once insulin drip complete 4.  Hypertension: Uncontrolled; continue atenolol and chlorthalidone.  Add low-dose ACE inhibitor. 5.  Tobacco abuse: NicoDerm patch while hospitalized 6.  DVT prophylaxis: Lovenox 7.  GI prophylaxis: Pantoprazole per home regimen The patient is a full code.  Time spent on admission orders and patient care approximately 45 minutes  Harrie Foreman,  MD 05/09/2019, 3:51 AM

## 2019-05-09 NOTE — ED Notes (Signed)
Hospitalist at bedside 

## 2019-05-09 NOTE — ED Notes (Signed)
IV Potassium running at 21mL/hour; pt not tolerating well at 163mL/hour.

## 2019-05-09 NOTE — ED Provider Notes (Addendum)
[facility] Emergency Department Provider Note    First MD Initiated Contact with Patient 05/09/19 0032     (approximate)  I have reviewed the triage vital signs and the nursing notes.   HISTORY  Chief Complaint Seizures   HPI Derrick Jones is a 73 y.o. male with below listed previous medical conditions including alcohol abuse presents emergency department secondary to witness seizure-like activity tonight by his wife.  Per EMS patient's wife states that he became stiff and then started shaking..  Patient does admit to losing consciousness.  Patient states that he had a posterior headache/neck pain before the incident.  Patient denies any complaints at present.  Patient denies any pain.  Patient states last EtOH ingestion 1 week ago        Past Medical History:  Diagnosis Date   Diabetes mellitus without complication ([place])    GERD (gastroesophageal reflux disease)    Hypertension     Patient Active Problem List   Diagnosis Date Noted   Alcoholic ketoacidosis [id number]   History of alcohol abuse 08/02/2017   Hypercholesterolemia 08/02/2017   Heartburn 07/02/2017   Prediabetes 05/27/2017   Adjustment disorder with mixed anxiety and depressed mood 05/11/2017   Anxiety 05/11/2017   Hypertension 05/11/2017   Insomnia 05/11/2017   History of gastric ulcer 05/11/2017   History of esophageal stricture 05/11/2017    Past Surgical History:  Procedure Laterality Date   EYE SURGERY     FEMUR FRACTURE SURGERY Left     Prior to Admission medications   Medication Sig Start Date End Date Taking? Authorizing Provider  atenolol-chlorthalidone (TENORETIC) 100-25 MG tablet Take 1 tablet by mouth daily.   Yes [provider]  metFORMIN (GLUCOPHAGE) 500 MG tablet Take 500 mg by mouth 2 (two) times daily.   Yes [provider]  omeprazole (PRILOSEC) 20 MG capsule Take 20 mg by mouth daily.   Yes [provider]     Allergies Patient has no known allergies.  Family History  Problem Relation Age of Onset   Breast cancer Sister    Ovarian cancer Sister     Social History Social History   Tobacco Use   Smoking status: Current Every Day Smoker   Smokeless tobacco: Never Used  Substance Use Topics   Alcohol use: No   Drug use: No    Review of Systems Constitutional: No fever/chills Eyes: No visual changes. ENT: No sore throat. Cardiovascular: Denies chest pain. Respiratory: Denies shortness of breath. Gastrointestinal: No abdominal pain.  No nausea, no vomiting.  No diarrhea.  No constipation. Genitourinary: Negative for dysuria. Musculoskeletal: Negative for neck pain.  Negative for back pain. Integumentary: Negative for rash. Neurological: Positive for headaches, negative for focal weakness or numbness.    ____________________________________________   PHYSICAL EXAM:  VITAL SIGNS: ED Triage Vitals  Enc Vitals Group     BP 05/09/19 0033 (!) 166/96     Pulse Rate 05/09/19 0033 (!) 103     Resp 05/09/19 0033 20     Temp 05/09/19 0033 98.5 F (36.9 C)     Temp Source 05/09/19 0033 Oral     SpO2 05/09/19 0028 98 %     Weight 05/09/19 0034 69.4 kg (153 lb)     Height 05/09/19 0034 1.753 m (5\' 9" )     Head Circumference --      Peak Flow --      Pain Score 05/09/19 0034 0     Pain Loc --  Pain Edu? --      Excl. in [place]? --     Constitutional: Alert and oriented.  Eyes: Conjunctivae are normal.  Head: Atraumatic. Mouth/Throat: Mucous membranes are moist. Neck: No stridor.  No meningeal signs.   Cardiovascular: Normal rate, regular rhythm. Good peripheral circulation. Grossly normal heart sounds. Respiratory: Normal respiratory effort.  No retractions. Gastrointestinal: Soft and nontender. No distention.  Musculoskeletal: No lower extremity tenderness nor edema. No gross deformities of extremities. Neurologic:  Normal speech and language. No gross focal  neurologic deficits are appreciated.  Tremulous Skin:  Skin is warm, dry and intact. Psychiatric: Anxious affect. Speech and behavior are normal.  ____________________________________________   LABS (all labs ordered are listed, but only abnormal results are displayed)  Labs Reviewed  CBC WITH DIFFERENTIAL/PLATELET - Abnormal; Notable for the following components:      Result Value   Abs Immature Granulocytes 0.14 (*)    All other components within normal limits  COMPREHENSIVE METABOLIC PANEL - Abnormal; Notable for the following components:   Sodium 129 (*)    Potassium 3.2 (*)    Chloride 88 (*)    CO2 21 (*)    Glucose, Bld 169 (*)    Anion gap 20 (*)    All other components within normal limits  ETHANOL  CBG MONITORING, ED   ____________________________________________  EKG  ED ECG REPORT I, [place] N Amma Crear, the attending physician, personally viewed and interpreted this ECG.   Date: 05/09/2019  EKG Time: 12:31 AM  Rate: 97  Rhythm: Normal sinus rhythm  Axis: Normal  Intervals: Normal  ST&T Change: None  ____________________________________________  RADIOLOGY I,  N Barclay Lennox, personally viewed and evaluated these images (plain radiographs) as part of my medical decision making, as well as reviewing the written report by the radiologist.    Official radiology report(s): Ct Angio Head W Or Wo Contrast  Result Date: 05/09/2019 CLINICAL DATA:  Initial evaluation for acute seizure. EXAM: CT ANGIOGRAPHY HEAD AND NECK TECHNIQUE: Multidetector CT imaging of the head and neck was performed using the standard protocol during bolus administration of intravenous contrast. Multiplanar CT image reconstructions and MIPs were obtained to evaluate the vascular anatomy. Carotid stenosis measurements (when applicable) are obtained utilizing NASCET criteria, using the distal internal carotid diameter as the denominator. CONTRAST:  16mL OMNIPAQUE IOHEXOL 350 MG/ML SOLN  COMPARISON:  None. FINDINGS: CT HEAD FINDINGS Brain: Age-related cerebral atrophy with moderate chronic small vessel ischemic disease. No evidence for acute intracranial hemorrhage. No findings to suggest acute large vessel territory infarct. No mass lesion, midline shift, or mass effect. Ventricles are normal in size without evidence for hydrocephalus. No extra-axial fluid collection identified. Vascular: No hyperdense vessel identified.Scattered vascular calcifications noted within the carotid siphons. Skull: Scalp soft tissues demonstrate no acute abnormality. Calvarium intact. Sinuses/Orbits: Globes and orbital soft tissues within normal limits. Visualized paranasal sinuses are clear. No mastoid effusion. CTA NECK FINDINGS Aortic arch: Visualized aortic arch of normal caliber with normal branch pattern. Mild atheromatous plaque noted within the arch itself. No hemodynamically significant stenosis seen about the origin of the great vessels. Visible subclavian arteries widely patent. Right carotid system: Right CCA tortuous proximally but patent to the bifurcation without stenosis. Mixed plaque about the right bifurcation/proximal right ICA with mild 25% stenosis by NASCET criteria. Right ICA otherwise widely patent distally to the skull base without stenosis, dissection, or occlusion. Left carotid system: Left CCA patent from its origin to the bifurcation without stenosis. Eccentric mixed plaque at the  origin of the left ICA with up to 50% stenosis by NASCET criteria. Left ICA patent distally to the skull base without additional stenosis, dissection, or occlusion. Evaluation mildly limited by motion. Vertebral arteries: Both vertebral arteries arise from the subclavian arteries. Atheromatous plaque at the origin of the vertebral arteries with approximate 50% stenosis bilaterally. No made of a focal penetrating plaque at the proximal right V1 segment just distal to the origin (series 11, image 85). Multifocal mild  to moderate narrowing about the proximal V2 segments related to adjacent uncovertebral disease. Vertebral arteries otherwise patent within the neck without hemodynamically significant stenosis or other acute vascular abnormality. Skeleton: No acute osseous finding. Chronic fracture about the right C3-4 facet noted. Conchae cavity at the superior endplate of T4 also chronic in appearance. No discrete osseous lesions. Moderate to advanced cervical spondylolysis at C4-5 through C6-7. Other neck: No other acute soft tissue abnormality within the neck. Upper chest: Layering secretions noted within the subglottic trachea near the carina, suggesting at this patient may be risk for aspiration. Mild upper lobe centrilobular emphysema. Review of the MIP images confirms the above findings CTA HEAD FINDINGS Anterior circulation: Petrous segments widely patent. Mild scattered atheromatous plaque within the cavernous/supraclinoid ICAs without hemodynamically significant stenosis. A1 segments patent bilaterally. 3 mm focal outpouching arising from the anterior communicating artery suspicious for a small aneurysm. Anterior cerebral arteries widely patent to their distal aspects without stenosis. No M1 stenosis or occlusion. Negative MCA bifurcations. Distal MCA branches well perfused and symmetric. Posterior circulation: Right vertebral artery widely patent to the vertebrobasilar junction. Focal atheromatous plaque within the proximal left V4 segment with short-segment moderate approximate 50% stenosis. Left V4 otherwise widely patent. Patent right PICA. Left PICA not seen. Basilar widely patent to its distal aspect. Superior cerebral arteries patent bilaterally. Both of the posterior cerebral arteries primarily supplied via the basilar well perfused to their distal aspects. Venous sinuses: Patent. Anatomic variants: None significant. Review of the MIP images confirms the above findings IMPRESSION: 1. Negative CTA for emergent  large vessel occlusion. 2. Atheromatous plaque about the carotid bifurcations bilaterally with associated stenosis of up to 50% on the left and 25% on the right. 3. Moderate approximate 50% stenoses about the origins of the vertebral arteries bilaterally, with additional short-segment 50% left V4 stenosis. 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 5. Layering secretions within the subglottic trachea near the carina, suggesting that this patient may be at risk for aspiration. 6. Emphysema. 7. Chronic fracture about the right C3-4 facet. Electronically Signed   By: Jeannine Boga M.D.   On: 05/09/2019 01:55   Ct Angio Neck W And/or Wo Contrast  Result Date: 05/09/2019 CLINICAL DATA:  Initial evaluation for acute seizure. EXAM: CT ANGIOGRAPHY HEAD AND NECK TECHNIQUE: Multidetector CT imaging of the head and neck was performed using the standard protocol during bolus administration of intravenous contrast. Multiplanar CT image reconstructions and MIPs were obtained to evaluate the vascular anatomy. Carotid stenosis measurements (when applicable) are obtained utilizing NASCET criteria, using the distal internal carotid diameter as the denominator. CONTRAST:  12mL OMNIPAQUE IOHEXOL 350 MG/ML SOLN COMPARISON:  None. FINDINGS: CT HEAD FINDINGS Brain: Age-related cerebral atrophy with moderate chronic small vessel ischemic disease. No evidence for acute intracranial hemorrhage. No findings to suggest acute large vessel territory infarct. No mass lesion, midline shift, or mass effect. Ventricles are normal in size without evidence for hydrocephalus. No extra-axial fluid collection identified. Vascular: No hyperdense vessel identified.Scattered vascular calcifications noted within the  carotid siphons. Skull: Scalp soft tissues demonstrate no acute abnormality. Calvarium intact. Sinuses/Orbits: Globes and orbital soft tissues within normal limits. Visualized paranasal sinuses are clear. No mastoid effusion. CTA  NECK FINDINGS Aortic arch: Visualized aortic arch of normal caliber with normal branch pattern. Mild atheromatous plaque noted within the arch itself. No hemodynamically significant stenosis seen about the origin of the great vessels. Visible subclavian arteries widely patent. Right carotid system: Right CCA tortuous proximally but patent to the bifurcation without stenosis. Mixed plaque about the right bifurcation/proximal right ICA with mild 25% stenosis by NASCET criteria. Right ICA otherwise widely patent distally to the skull base without stenosis, dissection, or occlusion. Left carotid system: Left CCA patent from its origin to the bifurcation without stenosis. Eccentric mixed plaque at the origin of the left ICA with up to 50% stenosis by NASCET criteria. Left ICA patent distally to the skull base without additional stenosis, dissection, or occlusion. Evaluation mildly limited by motion. Vertebral arteries: Both vertebral arteries arise from the subclavian arteries. Atheromatous plaque at the origin of the vertebral arteries with approximate 50% stenosis bilaterally. No made of a focal penetrating plaque at the proximal right V1 segment just distal to the origin (series 11, image 85). Multifocal mild to moderate narrowing about the proximal V2 segments related to adjacent uncovertebral disease. Vertebral arteries otherwise patent within the neck without hemodynamically significant stenosis or other acute vascular abnormality. Skeleton: No acute osseous finding. Chronic fracture about the right C3-4 facet noted. Conchae cavity at the superior endplate of T4 also chronic in appearance. No discrete osseous lesions. Moderate to advanced cervical spondylolysis at C4-5 through C6-7. Other neck: No other acute soft tissue abnormality within the neck. Upper chest: Layering secretions noted within the subglottic trachea near the carina, suggesting at this patient may be risk for aspiration. Mild upper lobe  centrilobular emphysema. Review of the MIP images confirms the above findings CTA HEAD FINDINGS Anterior circulation: Petrous segments widely patent. Mild scattered atheromatous plaque within the cavernous/supraclinoid ICAs without hemodynamically significant stenosis. A1 segments patent bilaterally. 3 mm focal outpouching arising from the anterior communicating artery suspicious for a small aneurysm. Anterior cerebral arteries widely patent to their distal aspects without stenosis. No M1 stenosis or occlusion. Negative MCA bifurcations. Distal MCA branches well perfused and symmetric. Posterior circulation: Right vertebral artery widely patent to the vertebrobasilar junction. Focal atheromatous plaque within the proximal left V4 segment with short-segment moderate approximate 50% stenosis. Left V4 otherwise widely patent. Patent right PICA. Left PICA not seen. Basilar widely patent to its distal aspect. Superior cerebral arteries patent bilaterally. Both of the posterior cerebral arteries primarily supplied via the basilar well perfused to their distal aspects. Venous sinuses: Patent. Anatomic variants: None significant. Review of the MIP images confirms the above findings IMPRESSION: 1. Negative CTA for emergent large vessel occlusion. 2. Atheromatous plaque about the carotid bifurcations bilaterally with associated stenosis of up to 50% on the left and 25% on the right. 3. Moderate approximate 50% stenoses about the origins of the vertebral arteries bilaterally, with additional short-segment 50% left V4 stenosis. 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 5. Layering secretions within the subglottic trachea near the carina, suggesting that this patient may be at risk for aspiration. 6. Emphysema. 7. Chronic fracture about the right C3-4 facet. Electronically Signed   By: Jeannine Boga M.D.   On: 05/09/2019 01:55    ____________________________________________   PROCEDURES    .Critical  Care Performed by: Gregor Hams, MD Authorized by:  Gregor Hams, MD   Critical care provider statement:    Critical care time (minutes):  30   Critical care time was exclusive of:  Separately billable procedures and treating other patients (Alcoholic ketoacidosis and alcohol withdrawal seizure)   Critical care was time spent personally by me on the following activities:  Development of treatment plan with patient or surrogate, discussions with consultants, evaluation of patient's response to treatment, examination of patient, obtaining history from patient or surrogate, ordering and performing treatments and interventions, ordering and review of laboratory studies, ordering and review of radiographic studies, pulse oximetry, re-evaluation of patient's condition and review of old charts     ____________________________________________   INITIAL IMPRESSION / MDM / [place] / ED COURSE  As part of my medical decision making, I reviewed the following data within the electronic MEDICAL RECORD NUMBER   73 year old male presenting with above-stated history and physical exam secondary to witnessed seizure-like activity.  Patient with a history of alcohol abuse and states that he stopped drinking last week.  Patient does admit to previous episodes of alcohol withdrawal with tremors however dates he is never had a seizure before.  Laboratory data reveal an anion gap of 20 with a glucose of 169.  Suspect possible EtOH withdrawal seizures etiology for the patient's symptoms and as such CIWA protocol initiated.  Patient given 1 mg of IV Ativan.  Patient discussed with Dr. Marcille Blanco for hospital admission further evaluation and management.  ____________________________________________  FINAL CLINICAL IMPRESSION(S) / ED DIAGNOSES  Final diagnoses:  Alcoholic ketoacidosis  Seizure ([place])     MEDICATIONS GIVEN DURING THIS VISIT:  Medications  LORazepam (ATIVAN) injection 0-4 mg (0 mg  Intravenous Not Given 05/09/19 0201)    Or  LORazepam (ATIVAN) tablet 0-4 mg ( Oral See Alternative 05/09/19 0201)  LORazepam (ATIVAN) injection 0-4 mg (has no administration in time range)    Or  LORazepam (ATIVAN) tablet 0-4 mg (has no administration in time range)  thiamine (VITAMIN B-1) tablet 100 mg (has no administration in time range)    Or  thiamine (B-1) injection 100 mg (has no administration in time range)  LORazepam (ATIVAN) injection 1 mg (1 mg Intravenous Given 05/09/19 0111)  iohexol (OMNIPAQUE) 350 MG/ML injection 75 mL (75 mLs Intravenous Contrast Given 05/09/19 0057)  sodium chloride 0.9 % bolus 1,000 mL (0 mLs Intravenous Stopped 05/09/19 [id number])     ED Discharge Orders    None      *Please note:  Derrick Jones was evaluated in Emergency Department on 05/09/2019 for the symptoms described in the history of present illness. He was evaluated in the context of the global COVID-19 pandemic, which necessitated consideration that the patient might be at risk for infection with the SARS-CoV-2 virus that causes COVID-19. Institutional protocols and algorithms that pertain to the evaluation of patients at risk for COVID-19 are in a state of rapid change based on information released by regulatory bodies including the CDC and federal and state organizations. These policies and algorithms were followed during the patient's care in the ED.  Some ED evaluations and interventions may be delayed as a result of limited staffing during the pandemic.*  Note:  This document was prepared using Dragon voice recognition software and may include unintentional dictation errors.   Gregor Hams, MD 05/09/19 0424    Gregor Hams, MD 05/09/19 [phone]

## 2019-05-09 NOTE — ED Triage Notes (Addendum)
Pt arrives to ED via [facility] from home with c/o seizure-like activity. Per EMS, pt has no seizure h/x. Pt did not loose bowel or bladder control, no post-ictal state. Pt states he remembers "jerking real bad"; does not appear to have any LOC. Pt arrives A&O, in NAD; RR even, regular, and unlabored. Dr Owens Shark at bedside upon pt's arrival to ED.

## 2019-05-09 NOTE — ED Notes (Signed)
ED TO INPATIENT HANDOFF REPORT  ED Nurse Name and Phone #: Caryl Pina, RN [phone]  S Name/Age/Gender Derrick Jones 73 y.o. male Room/Bed: ED13A/ED13A  Code Status   Code Status: Full Code  Home/SNF/Other Home Patient oriented to: self, place, time and situation Is this baseline? Yes   Triage Complete: Triage complete  Chief Complaint Poss Seizure  Triage Note Pt arrives to ED via [facility] from home with c/o seizure-like activity. Per EMS, pt has no seizure h/x. Pt did not loose bowel or bladder control, no post-ictal state. Pt states he remembers "jerking real bad"; does not appear to have any LOC. Pt arrives A&O, in NAD; RR even, regular, and unlabored. Dr Owens Shark at bedside upon pt's arrival to ED.   Allergies No Known Allergies  Level of Care/Admitting Diagnosis ED Disposition    ED Disposition Condition [facility] Area: [place] [[id number]]  Level of Care: Stepdown [14]  Covid Evaluation: Asymptomatic Screening Protocol (No Symptoms)  Diagnosis: Alcoholic ketoacidosis [id number]  Admitting Physician: Harrie Foreman [id number]  Attending Physician: Harrie Foreman [id number]  Estimated length of stay: past midnight tomorrow  Certification:: I certify this patient will need inpatient services for at least 2 midnights  PT Class (Do Not Modify): Inpatient [101]  PT Acc Code (Do Not Modify): Private [1]       B Medical/Surgery History Past Medical History:  Diagnosis Date  . Diabetes mellitus without complication ([place])   . GERD (gastroesophageal reflux disease)   . Hypertension    Past Surgical History:  Procedure Laterality Date  . EYE SURGERY    . FEMUR FRACTURE SURGERY Left      A IV Location/Drains/Wounds Patient Lines/Drains/Airways Status   Active Line/Drains/Airways    Name:   Placement date:   Placement time:   Site:   Days:   Peripheral IV 05/09/19 Left Forearm   05/09/19    0037    Forearm   less than 1   Peripheral  IV 05/09/19 Right Forearm   05/09/19    0644    Forearm   less than 1          Intake/Output Last 24 hours  Intake/Output Summary (Last 24 hours) at 05/09/2019 0856 Last data filed at 05/09/2019 [id number] Gross per 24 hour  Intake 2000 ml  Output -  Net 2000 ml    Labs/Imaging Results for orders placed or performed during the hospital encounter of 05/09/19 (from the past 48 hour(s))  CBC WITH DIFFERENTIAL     Status: Abnormal   Collection Time: 05/09/19 12:32 AM  Result Value Ref Range   WBC 9.7 4.0 - 10.5 K/uL   RBC 4.39 4.22 - 5.81 MIL/uL   Hemoglobin 13.9 13.0 - 17.0 g/dL   HCT 39.6 39.0 - 52.0 %   MCV 90.2 80.0 - 100.0 fL   MCH 31.7 26.0 - 34.0 pg   MCHC 35.1 30.0 - 36.0 g/dL   RDW 13.2 11.5 - 15.5 %   Platelets 222 150 - 400 K/uL   nRBC 0.0 0.0 - 0.2 %   Neutrophils Relative % 66 %   Neutro Abs 6.4 1.7 - 7.7 K/uL   Lymphocytes Relative 24 %   Lymphs Abs 2.3 0.7 - 4.0 K/uL   Monocytes Relative 9 %   Monocytes Absolute 0.8 0.1 - 1.0 K/uL   Eosinophils Relative 0 %   Eosinophils Absolute 0.0 0.0 - 0.5 K/uL   Basophils Relative 0 %  Basophils Absolute 0.0 0.0 - 0.1 K/uL   Immature Granulocytes 1 %   Abs Immature Granulocytes 0.14 (H) 0.00 - 0.07 K/uL    Comment: Performed at [facility], [address] [place], [place] [address]  Comprehensive metabolic panel     Status: Abnormal   Collection Time: 05/09/19 12:32 AM  Result Value Ref Range   Sodium 129 (L) 135 - 145 mmol/L   Potassium 3.2 (L) 3.5 - 5.1 mmol/L    Comment: HEMOLYSIS AT THIS LEVEL MAY AFFECT RESULT   Chloride 88 (L) 98 - 111 mmol/L   CO2 21 (L) 22 - 32 mmol/L   Glucose, Bld 169 (H) 70 - 99 mg/dL   BUN 11 8 - 23 mg/dL   Creatinine, Ser 0.92 0.61 - 1.24 mg/dL   Calcium 9.1 8.9 - 10.3 mg/dL   Total Protein 7.1 6.5 - 8.1 g/dL   Albumin 4.4 3.5 - 5.0 g/dL   AST 34 15 - 41 U/L   ALT 40 0 - 44 U/L   Alkaline Phosphatase 48 38 - 126 U/L   Total Bilirubin 1.2 0.3 - 1.2 mg/dL   GFR calc non Af  Amer >60 >60 mL/min   GFR calc Af Amer >60 >60 mL/min   Anion gap 20 (H) 5 - 15    Comment: Performed at [facility], [address] [place], [place] [address]  Ethanol     Status: None   Collection Time: 05/09/19 12:32 AM  Result Value Ref Range   Alcohol, Ethyl (B) <10 <10 mg/dL    Comment: (NOTE) Lowest detectable limit for serum alcohol is 10 mg/dL. For medical purposes only. Performed at [facility], [address] [place], [place] [id number]   Basic metabolic panel     Status: Abnormal   Collection Time: 05/09/19  6:31 AM  Result Value Ref Range   Sodium 132 (L) 135 - 145 mmol/L   Potassium 2.8 (L) 3.5 - 5.1 mmol/L   Chloride 95 (L) 98 - 111 mmol/L   CO2 26 22 - 32 mmol/L   Glucose, Bld 109 (H) 70 - 99 mg/dL   BUN 8 8 - 23 mg/dL   Creatinine, Ser 0.70 0.61 - 1.24 mg/dL   Calcium 8.4 (L) 8.9 - 10.3 mg/dL   GFR calc non Af Amer >60 >60 mL/min   GFR calc Af Amer >60 >60 mL/min   Anion gap 11 5 - 15    Comment: Performed at [facility], [address] [place], [place] [address]  Glucose, capillary     Status: Abnormal   Collection Time: 05/09/19  6:35 AM  Result Value Ref Range   Glucose-Capillary 101 (H) 70 - 99 mg/dL  Glucose, capillary     Status: None   Collection Time: 05/09/19  7:08 AM  Result Value Ref Range   Glucose-Capillary 92 70 - 99 mg/dL   Ct Angio Head W Or Wo Contrast  Result Date: 05/09/2019 CLINICAL DATA:  Initial evaluation for acute seizure. EXAM: CT ANGIOGRAPHY HEAD AND NECK TECHNIQUE: Multidetector CT imaging of the head and neck was performed using the standard protocol during bolus administration of intravenous contrast. Multiplanar CT image reconstructions and MIPs were obtained to evaluate the vascular anatomy. Carotid stenosis measurements (when applicable) are obtained utilizing NASCET criteria, using the distal internal carotid diameter as the denominator. CONTRAST:  14mL OMNIPAQUE IOHEXOL 350 MG/ML SOLN  COMPARISON:  None. FINDINGS: CT HEAD FINDINGS Brain: Age-related cerebral atrophy with moderate chronic  small vessel ischemic disease. No evidence for acute intracranial hemorrhage. No findings to suggest acute large vessel territory infarct. No mass lesion, midline shift, or mass effect. Ventricles are normal in size without evidence for hydrocephalus. No extra-axial fluid collection identified. Vascular: No hyperdense vessel identified.Scattered vascular calcifications noted within the carotid siphons. Skull: Scalp soft tissues demonstrate no acute abnormality. Calvarium intact. Sinuses/Orbits: Globes and orbital soft tissues within normal limits. Visualized paranasal sinuses are clear. No mastoid effusion. CTA NECK FINDINGS Aortic arch: Visualized aortic arch of normal caliber with normal branch pattern. Mild atheromatous plaque noted within the arch itself. No hemodynamically significant stenosis seen about the origin of the great vessels. Visible subclavian arteries widely patent. Right carotid system: Right CCA tortuous proximally but patent to the bifurcation without stenosis. Mixed plaque about the right bifurcation/proximal right ICA with mild 25% stenosis by NASCET criteria. Right ICA otherwise widely patent distally to the skull base without stenosis, dissection, or occlusion. Left carotid system: Left CCA patent from its origin to the bifurcation without stenosis. Eccentric mixed plaque at the origin of the left ICA with up to 50% stenosis by NASCET criteria. Left ICA patent distally to the skull base without additional stenosis, dissection, or occlusion. Evaluation mildly limited by motion. Vertebral arteries: Both vertebral arteries arise from the subclavian arteries. Atheromatous plaque at the origin of the vertebral arteries with approximate 50% stenosis bilaterally. No made of a focal penetrating plaque at the proximal right V1 segment just distal to the origin (series 11, image 85). Multifocal mild  to moderate narrowing about the proximal V2 segments related to adjacent uncovertebral disease. Vertebral arteries otherwise patent within the neck without hemodynamically significant stenosis or other acute vascular abnormality. Skeleton: No acute osseous finding. Chronic fracture about the right C3-4 facet noted. Conchae cavity at the superior endplate of T4 also chronic in appearance. No discrete osseous lesions. Moderate to advanced cervical spondylolysis at C4-5 through C6-7. Other neck: No other acute soft tissue abnormality within the neck. Upper chest: Layering secretions noted within the subglottic trachea near the carina, suggesting at this patient may be risk for aspiration. Mild upper lobe centrilobular emphysema. Review of the MIP images confirms the above findings CTA HEAD FINDINGS Anterior circulation: Petrous segments widely patent. Mild scattered atheromatous plaque within the cavernous/supraclinoid ICAs without hemodynamically significant stenosis. A1 segments patent bilaterally. 3 mm focal outpouching arising from the anterior communicating artery suspicious for a small aneurysm. Anterior cerebral arteries widely patent to their distal aspects without stenosis. No M1 stenosis or occlusion. Negative MCA bifurcations. Distal MCA branches well perfused and symmetric. Posterior circulation: Right vertebral artery widely patent to the vertebrobasilar junction. Focal atheromatous plaque within the proximal left V4 segment with short-segment moderate approximate 50% stenosis. Left V4 otherwise widely patent. Patent right PICA. Left PICA not seen. Basilar widely patent to its distal aspect. Superior cerebral arteries patent bilaterally. Both of the posterior cerebral arteries primarily supplied via the basilar well perfused to their distal aspects. Venous sinuses: Patent. Anatomic variants: None significant. Review of the MIP images confirms the above findings IMPRESSION: 1. Negative CTA for emergent  large vessel occlusion. 2. Atheromatous plaque about the carotid bifurcations bilaterally with associated stenosis of up to 50% on the left and 25% on the right. 3. Moderate approximate 50% stenoses about the origins of the vertebral arteries bilaterally, with additional short-segment 50% left V4 stenosis. 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 5. Layering secretions within the subglottic trachea near the carina, suggesting that this patient  may be at risk for aspiration. 6. Emphysema. 7. Chronic fracture about the right C3-4 facet. Electronically Signed   By: Jeannine Boga M.D.   On: 05/09/2019 01:55   Ct Angio Neck W And/or Wo Contrast  Result Date: 05/09/2019 CLINICAL DATA:  Initial evaluation for acute seizure. EXAM: CT ANGIOGRAPHY HEAD AND NECK TECHNIQUE: Multidetector CT imaging of the head and neck was performed using the standard protocol during bolus administration of intravenous contrast. Multiplanar CT image reconstructions and MIPs were obtained to evaluate the vascular anatomy. Carotid stenosis measurements (when applicable) are obtained utilizing NASCET criteria, using the distal internal carotid diameter as the denominator. CONTRAST:  61mL OMNIPAQUE IOHEXOL 350 MG/ML SOLN COMPARISON:  None. FINDINGS: CT HEAD FINDINGS Brain: Age-related cerebral atrophy with moderate chronic small vessel ischemic disease. No evidence for acute intracranial hemorrhage. No findings to suggest acute large vessel territory infarct. No mass lesion, midline shift, or mass effect. Ventricles are normal in size without evidence for hydrocephalus. No extra-axial fluid collection identified. Vascular: No hyperdense vessel identified.Scattered vascular calcifications noted within the carotid siphons. Skull: Scalp soft tissues demonstrate no acute abnormality. Calvarium intact. Sinuses/Orbits: Globes and orbital soft tissues within normal limits. Visualized paranasal sinuses are clear. No mastoid effusion. CTA  NECK FINDINGS Aortic arch: Visualized aortic arch of normal caliber with normal branch pattern. Mild atheromatous plaque noted within the arch itself. No hemodynamically significant stenosis seen about the origin of the great vessels. Visible subclavian arteries widely patent. Right carotid system: Right CCA tortuous proximally but patent to the bifurcation without stenosis. Mixed plaque about the right bifurcation/proximal right ICA with mild 25% stenosis by NASCET criteria. Right ICA otherwise widely patent distally to the skull base without stenosis, dissection, or occlusion. Left carotid system: Left CCA patent from its origin to the bifurcation without stenosis. Eccentric mixed plaque at the origin of the left ICA with up to 50% stenosis by NASCET criteria. Left ICA patent distally to the skull base without additional stenosis, dissection, or occlusion. Evaluation mildly limited by motion. Vertebral arteries: Both vertebral arteries arise from the subclavian arteries. Atheromatous plaque at the origin of the vertebral arteries with approximate 50% stenosis bilaterally. No made of a focal penetrating plaque at the proximal right V1 segment just distal to the origin (series 11, image 85). Multifocal mild to moderate narrowing about the proximal V2 segments related to adjacent uncovertebral disease. Vertebral arteries otherwise patent within the neck without hemodynamically significant stenosis or other acute vascular abnormality. Skeleton: No acute osseous finding. Chronic fracture about the right C3-4 facet noted. Conchae cavity at the superior endplate of T4 also chronic in appearance. No discrete osseous lesions. Moderate to advanced cervical spondylolysis at C4-5 through C6-7. Other neck: No other acute soft tissue abnormality within the neck. Upper chest: Layering secretions noted within the subglottic trachea near the carina, suggesting at this patient may be risk for aspiration. Mild upper lobe  centrilobular emphysema. Review of the MIP images confirms the above findings CTA HEAD FINDINGS Anterior circulation: Petrous segments widely patent. Mild scattered atheromatous plaque within the cavernous/supraclinoid ICAs without hemodynamically significant stenosis. A1 segments patent bilaterally. 3 mm focal outpouching arising from the anterior communicating artery suspicious for a small aneurysm. Anterior cerebral arteries widely patent to their distal aspects without stenosis. No M1 stenosis or occlusion. Negative MCA bifurcations. Distal MCA branches well perfused and symmetric. Posterior circulation: Right vertebral artery widely patent to the vertebrobasilar junction. Focal atheromatous plaque within the proximal left V4 segment with short-segment moderate approximate  50% stenosis. Left V4 otherwise widely patent. Patent right PICA. Left PICA not seen. Basilar widely patent to its distal aspect. Superior cerebral arteries patent bilaterally. Both of the posterior cerebral arteries primarily supplied via the basilar well perfused to their distal aspects. Venous sinuses: Patent. Anatomic variants: None significant. Review of the MIP images confirms the above findings IMPRESSION: 1. Negative CTA for emergent large vessel occlusion. 2. Atheromatous plaque about the carotid bifurcations bilaterally with associated stenosis of up to 50% on the left and 25% on the right. 3. Moderate approximate 50% stenoses about the origins of the vertebral arteries bilaterally, with additional short-segment 50% left V4 stenosis. 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 5. Layering secretions within the subglottic trachea near the carina, suggesting that this patient may be at risk for aspiration. 6. Emphysema. 7. Chronic fracture about the right C3-4 facet. Electronically Signed   By: Jeannine Boga M.D.   On: 05/09/2019 01:55    Pending Labs Unresulted Labs (From admission, onward)    Start     Ordered    05/16/19 0500  Creatinine, serum  (enoxaparin (LOVENOX)    CrCl >/= 30 ml/min)  Weekly,   STAT    Comments: while on enoxaparin therapy    05/09/19 0622   05/09/19 0809  SARS CORONAVIRUS 2 (TAT 6-24 HRS) Nasopharyngeal Nasopharyngeal Swab  (Asymptomatic/Tier 2 Patients Labs)  Once,   STAT    Question Answer Comment  Is this test for diagnosis or screening Screening   Symptomatic for COVID-19 as defined by CDC No   Hospitalized for COVID-19 No   Admitted to ICU for COVID-19 No   Previously tested for COVID-19 No   Resident in a congregate (group) care setting No   Employed in healthcare setting No      05/09/19 0809   05/09/19 0802  SARS Coronavirus 2 [facility] order, Performed in [place] hospital lab) Nasopharyngeal Nasopharyngeal Swab  (Symptomatic/High Risk of Exposure/Tier 1 Patients Labs with Precautions)  ONCE - STAT,   STAT    Question Answer Comment  Is this test for diagnosis or screening Screening   Symptomatic for COVID-19 as defined by CDC No   Hospitalized for COVID-19 No   Admitted to ICU for COVID-19 No   Previously tested for COVID-19 No   Resident in a congregate (group) care setting No   Employed in healthcare setting No      05/09/19 0801   05/09/19 [id number]  Basic metabolic panel  STAT Now then every 4 hours ,   STAT     05/09/19 0622   05/09/19 0623  Hemoglobin A1c  Once,   STAT    Comments: To assess prior glycemic control.    05/09/19 0622   Signed and Held  Comprehensive metabolic panel  Once,   R     Signed and Held   Signed and Held  Magnesium  Once,   R     Signed and Held   Signed and Held  Phosphorus  Once,   R     Signed and Held   Signed and Held  CBC  Once,   R     Signed and Held          Vitals/Pain Today's Vitals   05/09/19 0600 05/09/19 0647 05/09/19 0700 05/09/19 0800  BP: 134/90 (!) 141/94 130/80 (!) 149/96  Pulse: 85 71 75 79  Resp: 20 16 19 18   Temp:      TempSrc:  SpO2: 96% 98% 97% 99%  Weight:      Height:       PainSc:  0-No pain      Isolation Precautions Airborne and Contact precautions  Medications Medications  thiamine (VITAMIN B-1) tablet 100 mg (has no administration in time range)    Or  thiamine (B-1) injection 100 mg (has no administration in time range)  pantoprazole (PROTONIX) EC tablet 40 mg (has no administration in time range)  0.9 %  sodium chloride infusion ( Intravenous Stopped 05/09/19 0733)  0.9 %  sodium chloride infusion ( Intravenous Stopped 05/09/19 0751)  dextrose 5 %-0.45 % sodium chloride infusion ( Intravenous New Bag/Given 05/09/19 0746)  insulin regular, human (MYXREDLIN) 100 units/ 100 mL infusion (0.3 Units/hr Intravenous New Bag/Given 05/09/19 0752)  enoxaparin (LOVENOX) injection 40 mg (40 mg Subcutaneous Given 05/09/19 0645)  potassium chloride 10 mEq in 100 mL IVPB (10 mEq Intravenous New Bag/Given 05/09/19 0738)  atenolol (TENORMIN) tablet 100 mg (has no administration in time range)    And  chlorthalidone (HYGROTON) tablet 25 mg (has no administration in time range)  LORazepam (ATIVAN) injection 1 mg (1 mg Intravenous Given 05/09/19 0111)  iohexol (OMNIPAQUE) 350 MG/ML injection 75 mL (75 mLs Intravenous Contrast Given 05/09/19 0057)  sodium chloride 0.9 % bolus 1,000 mL (0 mLs Intravenous Stopped 05/09/19 0307)  potassium chloride SA (K-DUR) CR tablet 40 mEq (40 mEq Oral Given 05/09/19 0731)    Mobility walks Low fall risk   Focused Assessments N/A   R Recommendations: See Admitting Provider Note  Report given to:   Additional Notes: Insulin drip for alcohol induced ketoacidosis.

## 2019-05-09 NOTE — ED Notes (Addendum)
Attempted to call report, RN taking assigned room and charge RN are both unavailable at this time.  Will transport patient to room at 1000; RN can call me with any questions after looking over handoff report.

## 2019-05-09 NOTE — Progress Notes (Signed)
[facility] [place] NAME: Derrick Jones    MR#:  [id number]  DATE OF BIRTH:  [birth year]  SUBJECTIVE:  CHIEF COMPLAINT:   Chief Complaint  Patient presents with  . Seizures  Patient seen and evaluated today No new episodes of seizures Anion gap is normalized Has nausea but no vomiting No chest pain  REVIEW OF SYSTEMS:    ROS  CONSTITUTIONAL: No documented fever. Has fatigue, weakness. No weight gain, no weight loss.  EYES: No blurry or double vision.  ENT: No tinnitus. No postnasal drip. No redness of the oropharynx.  RESPIRATORY: No cough, no wheeze, no hemoptysis. No dyspnea.  CARDIOVASCULAR: No chest pain. No orthopnea. No palpitations. No syncope.  GASTROINTESTINAL: Has nausea, no vomiting or diarrhea. No abdominal pain. No melena or hematochezia.  GENITOURINARY: No dysuria or hematuria.  ENDOCRINE: No polyuria or nocturia. No heat or cold intolerance.  HEMATOLOGY: No anemia. No bruising. No bleeding.  INTEGUMENTARY: No rashes. No lesions.  MUSCULOSKELETAL: No arthritis. No swelling. No gout.  NEUROLOGIC: No numbness, tingling, or ataxia. No seizure-type activity.  PSYCHIATRIC: No anxiety. No insomnia. No ADD.   DRUG ALLERGIES:  No Known Allergies  VITALS:  Blood pressure (!) 148/91, pulse 77, temperature 98.8 F (37.1 C), temperature source Oral, resp. rate 14, height 5\' 9"  (1.753 m), weight 69.4 kg, SpO2 92 %.  PHYSICAL EXAMINATION:   Physical Exam  GENERAL:  73 y.o.-year-old patient lying in the bed with no acute distress.  EYES: Pupils equal, round, reactive to light and accommodation. No scleral icterus. Extraocular muscles intact.  HEENT: Head atraumatic, normocephalic. Oropharynx dry and nasopharynx clear.  NECK:  Supple, no jugular venous distention. No thyroid enlargement, no tenderness.  LUNGS: Normal breath sounds bilaterally, no wheezing, rales, rhonchi. No use of accessory muscles of respiration.   CARDIOVASCULAR: S1, S2 normal. No murmurs, rubs, or gallops.  ABDOMEN: Soft, nontender, nondistended. Bowel sounds present. No organomegaly or mass.  EXTREMITIES: No cyanosis, clubbing or edema b/l.    NEUROLOGIC: Cranial nerves II through XII are intact. No focal Motor or sensory deficits b/l.   PSYCHIATRIC: The patient is alert and oriented x 3.  SKIN: No obvious rash, lesion, or ulcer.   LABORATORY PANEL:   CBC Recent Labs  Lab 05/09/19 1019  WBC 8.3  HGB 12.5*  HCT 35.6*  PLT 201   ------------------------------------------------------------------------------------------------------------------ Chemistries  Recent Labs  Lab 05/09/19 0032 05/09/19 0631  NA 129* 132*  K 3.2* 2.8*  CL 88* 95*  CO2 21* 26  GLUCOSE 169* 109*  BUN 11 8  CREATININE 0.92 0.70  CALCIUM 9.1 8.4*  AST 34  --   ALT 40  --   ALKPHOS 48  --   BILITOT 1.2  --    ------------------------------------------------------------------------------------------------------------------  Cardiac Enzymes No results for input(s): TROPONINI in the last 168 hours. ------------------------------------------------------------------------------------------------------------------  RADIOLOGY:  Ct Angio Head W Or Wo Contrast  Result Date: 05/09/2019 CLINICAL DATA:  Initial evaluation for acute seizure. EXAM: CT ANGIOGRAPHY HEAD AND NECK TECHNIQUE: Multidetector CT imaging of the head and neck was performed using the standard protocol during bolus administration of intravenous contrast. Multiplanar CT image reconstructions and MIPs were obtained to evaluate the vascular anatomy. Carotid stenosis measurements (when applicable) are obtained utilizing NASCET criteria, using the distal internal carotid diameter as the denominator. CONTRAST:  36mL OMNIPAQUE IOHEXOL 350 MG/ML SOLN COMPARISON:  None. FINDINGS: CT HEAD FINDINGS Brain: Age-related cerebral atrophy with moderate chronic small vessel ischemic  disease. No  evidence for acute intracranial hemorrhage. No findings to suggest acute large vessel territory infarct. No mass lesion, midline shift, or mass effect. Ventricles are normal in size without evidence for hydrocephalus. No extra-axial fluid collection identified. Vascular: No hyperdense vessel identified.Scattered vascular calcifications noted within the carotid siphons. Skull: Scalp soft tissues demonstrate no acute abnormality. Calvarium intact. Sinuses/Orbits: Globes and orbital soft tissues within normal limits. Visualized paranasal sinuses are clear. No mastoid effusion. CTA NECK FINDINGS Aortic arch: Visualized aortic arch of normal caliber with normal branch pattern. Mild atheromatous plaque noted within the arch itself. No hemodynamically significant stenosis seen about the origin of the great vessels. Visible subclavian arteries widely patent. Right carotid system: Right CCA tortuous proximally but patent to the bifurcation without stenosis. Mixed plaque about the right bifurcation/proximal right ICA with mild 25% stenosis by NASCET criteria. Right ICA otherwise widely patent distally to the skull base without stenosis, dissection, or occlusion. Left carotid system: Left CCA patent from its origin to the bifurcation without stenosis. Eccentric mixed plaque at the origin of the left ICA with up to 50% stenosis by NASCET criteria. Left ICA patent distally to the skull base without additional stenosis, dissection, or occlusion. Evaluation mildly limited by motion. Vertebral arteries: Both vertebral arteries arise from the subclavian arteries. Atheromatous plaque at the origin of the vertebral arteries with approximate 50% stenosis bilaterally. No made of a focal penetrating plaque at the proximal right V1 segment just distal to the origin (series 11, image 85). Multifocal mild to moderate narrowing about the proximal V2 segments related to adjacent uncovertebral disease. Vertebral arteries otherwise patent  within the neck without hemodynamically significant stenosis or other acute vascular abnormality. Skeleton: No acute osseous finding. Chronic fracture about the right C3-4 facet noted. Conchae cavity at the superior endplate of T4 also chronic in appearance. No discrete osseous lesions. Moderate to advanced cervical spondylolysis at C4-5 through C6-7. Other neck: No other acute soft tissue abnormality within the neck. Upper chest: Layering secretions noted within the subglottic trachea near the carina, suggesting at this patient may be risk for aspiration. Mild upper lobe centrilobular emphysema. Review of the MIP images confirms the above findings CTA HEAD FINDINGS Anterior circulation: Petrous segments widely patent. Mild scattered atheromatous plaque within the cavernous/supraclinoid ICAs without hemodynamically significant stenosis. A1 segments patent bilaterally. 3 mm focal outpouching arising from the anterior communicating artery suspicious for a small aneurysm. Anterior cerebral arteries widely patent to their distal aspects without stenosis. No M1 stenosis or occlusion. Negative MCA bifurcations. Distal MCA branches well perfused and symmetric. Posterior circulation: Right vertebral artery widely patent to the vertebrobasilar junction. Focal atheromatous plaque within the proximal left V4 segment with short-segment moderate approximate 50% stenosis. Left V4 otherwise widely patent. Patent right PICA. Left PICA not seen. Basilar widely patent to its distal aspect. Superior cerebral arteries patent bilaterally. Both of the posterior cerebral arteries primarily supplied via the basilar well perfused to their distal aspects. Venous sinuses: Patent. Anatomic variants: None significant. Review of the MIP images confirms the above findings IMPRESSION: 1. Negative CTA for emergent large vessel occlusion. 2. Atheromatous plaque about the carotid bifurcations bilaterally with associated stenosis of up to 50% on the  left and 25% on the right. 3. Moderate approximate 50% stenoses about the origins of the vertebral arteries bilaterally, with additional short-segment 50% left V4 stenosis. 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 5. Layering secretions within the subglottic trachea near the carina, suggesting that this patient may be  at risk for aspiration. 6. Emphysema. 7. Chronic fracture about the right C3-4 facet. Electronically Signed   By: Jeannine Boga M.D.   On: 05/09/2019 01:55   Ct Angio Neck W And/or Wo Contrast  Result Date: 05/09/2019 CLINICAL DATA:  Initial evaluation for acute seizure. EXAM: CT ANGIOGRAPHY HEAD AND NECK TECHNIQUE: Multidetector CT imaging of the head and neck was performed using the standard protocol during bolus administration of intravenous contrast. Multiplanar CT image reconstructions and MIPs were obtained to evaluate the vascular anatomy. Carotid stenosis measurements (when applicable) are obtained utilizing NASCET criteria, using the distal internal carotid diameter as the denominator. CONTRAST:  58mL OMNIPAQUE IOHEXOL 350 MG/ML SOLN COMPARISON:  None. FINDINGS: CT HEAD FINDINGS Brain: Age-related cerebral atrophy with moderate chronic small vessel ischemic disease. No evidence for acute intracranial hemorrhage. No findings to suggest acute large vessel territory infarct. No mass lesion, midline shift, or mass effect. Ventricles are normal in size without evidence for hydrocephalus. No extra-axial fluid collection identified. Vascular: No hyperdense vessel identified.Scattered vascular calcifications noted within the carotid siphons. Skull: Scalp soft tissues demonstrate no acute abnormality. Calvarium intact. Sinuses/Orbits: Globes and orbital soft tissues within normal limits. Visualized paranasal sinuses are clear. No mastoid effusion. CTA NECK FINDINGS Aortic arch: Visualized aortic arch of normal caliber with normal branch pattern. Mild atheromatous plaque noted within  the arch itself. No hemodynamically significant stenosis seen about the origin of the great vessels. Visible subclavian arteries widely patent. Right carotid system: Right CCA tortuous proximally but patent to the bifurcation without stenosis. Mixed plaque about the right bifurcation/proximal right ICA with mild 25% stenosis by NASCET criteria. Right ICA otherwise widely patent distally to the skull base without stenosis, dissection, or occlusion. Left carotid system: Left CCA patent from its origin to the bifurcation without stenosis. Eccentric mixed plaque at the origin of the left ICA with up to 50% stenosis by NASCET criteria. Left ICA patent distally to the skull base without additional stenosis, dissection, or occlusion. Evaluation mildly limited by motion. Vertebral arteries: Both vertebral arteries arise from the subclavian arteries. Atheromatous plaque at the origin of the vertebral arteries with approximate 50% stenosis bilaterally. No made of a focal penetrating plaque at the proximal right V1 segment just distal to the origin (series 11, image 85). Multifocal mild to moderate narrowing about the proximal V2 segments related to adjacent uncovertebral disease. Vertebral arteries otherwise patent within the neck without hemodynamically significant stenosis or other acute vascular abnormality. Skeleton: No acute osseous finding. Chronic fracture about the right C3-4 facet noted. Conchae cavity at the superior endplate of T4 also chronic in appearance. No discrete osseous lesions. Moderate to advanced cervical spondylolysis at C4-5 through C6-7. Other neck: No other acute soft tissue abnormality within the neck. Upper chest: Layering secretions noted within the subglottic trachea near the carina, suggesting at this patient may be risk for aspiration. Mild upper lobe centrilobular emphysema. Review of the MIP images confirms the above findings CTA HEAD FINDINGS Anterior circulation: Petrous segments widely  patent. Mild scattered atheromatous plaque within the cavernous/supraclinoid ICAs without hemodynamically significant stenosis. A1 segments patent bilaterally. 3 mm focal outpouching arising from the anterior communicating artery suspicious for a small aneurysm. Anterior cerebral arteries widely patent to their distal aspects without stenosis. No M1 stenosis or occlusion. Negative MCA bifurcations. Distal MCA branches well perfused and symmetric. Posterior circulation: Right vertebral artery widely patent to the vertebrobasilar junction. Focal atheromatous plaque within the proximal left V4 segment with short-segment moderate approximate 50% stenosis.  Left V4 otherwise widely patent. Patent right PICA. Left PICA not seen. Basilar widely patent to its distal aspect. Superior cerebral arteries patent bilaterally. Both of the posterior cerebral arteries primarily supplied via the basilar well perfused to their distal aspects. Venous sinuses: Patent. Anatomic variants: None significant. Review of the MIP images confirms the above findings IMPRESSION: 1. Negative CTA for emergent large vessel occlusion. 2. Atheromatous plaque about the carotid bifurcations bilaterally with associated stenosis of up to 50% on the left and 25% on the right. 3. Moderate approximate 50% stenoses about the origins of the vertebral arteries bilaterally, with additional short-segment 50% left V4 stenosis. 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 5. Layering secretions within the subglottic trachea near the carina, suggesting that this patient may be at risk for aspiration. 6. Emphysema. 7. Chronic fracture about the right C3-4 facet. Electronically Signed   By: Jeannine Boga M.D.   On: 05/09/2019 01:55     ASSESSMENT AND PLAN:   73 year old male patient with history of alcohol abuse type 2 diabetes mellitus hypertension tobacco abuse currently under hospitalist service  -Alcoholic ketoacidosis Anion gap is normalized Off  insulin drip Transition orders Control blood sugars with Levemir insulin and sliding scale coverage with insulin Monitor electrolytes  -Acute hypokalemia Replace potassium aggressively  -Alcohol abuse CIWA protocol Monitor for seizures Social worker follow-up for help and guidance  -Type 2 diabetes mellitus Continue atenolol Low-dose ACE inhibitor  -Tobacco abuse Tobacco cessation counseled to the patient for 6 minutes Nicotine patch offered  All the records are reviewed and case discussed with Care Management/Social Worker. Management plans discussed with the patient, family and they are in agreement.  CODE STATUS: Full code  DVT Prophylaxis: SCDs  TOTAL TIME TAKING CARE OF THIS PATIENT: 45 minutes.   POSSIBLE D/C IN 2 to 3 DAYS, DEPENDING ON CLINICAL CONDITION.  Saundra Shelling M.D on 05/09/2019 at 10:37 AM  Between 7am to 6pm - Pager - [phone]  After 6pm go to [email or web address] - password [email or web address] [facility]  Office  [phone]  CC: Primary care physician; Cletis Athens, MD  Note: This dictation was prepared with Dragon dictation along with smaller phrase technology. Any transcriptional errors that result from this process are unintentional.

## 2019-05-09 NOTE — ED Notes (Signed)
Per Hospitalist, Pyreddy, insulin drip to be d/c'd and plans for patient room to be reassigned from CCU.

## 2019-05-10 LAB — BASIC METABOLIC PANEL
Anion gap: 6 (ref 5–15)
Anion gap: 7 (ref 5–15)
BUN: <5 mg/dL — ABNORMAL LOW (ref 8–23)
BUN: <5 mg/dL — ABNORMAL LOW (ref 8–23)
CO2: 26 mmol/L (ref 22–32)
CO2: 26 mmol/L (ref 22–32)
Calcium: 8.4 mg/dL — ABNORMAL LOW (ref 8.9–10.3)
Calcium: 8.5 mg/dL — ABNORMAL LOW (ref 8.9–10.3)
Chloride: 103 mmol/L (ref 98–111)
Chloride: 102 mmol/L (ref 98–111)
Creatinine, Ser: 0.74 mg/dL (ref 0.61–1.24)
Creatinine, Ser: 0.66 mg/dL (ref 0.61–1.24)
GFR calc Af Amer: >60 mL/min (ref >60)
GFR calc Af Amer: >60 mL/min (ref >60)
GFR calc non Af Amer: >60 mL/min (ref >60)
GFR calc non Af Amer: >60 mL/min (ref >60)
Glucose, Bld: 73 mg/dL (ref 70–99)
Glucose, Bld: 59 mg/dL — ABNORMAL LOW (ref 70–99)
Potassium: 4.4 mmol/L (ref 3.5–5.1)
Potassium: 4.8 mmol/L (ref 3.5–5.1)
Sodium: 135 mmol/L (ref 135–145)
Sodium: 135 mmol/L (ref 135–145)

## 2019-05-10 LAB — MAGNESIUM: Magnesium: 1.7 mg/dL (ref 1.7–2.4)

## 2019-05-10 LAB — GLUCOSE, CAPILLARY
Glucose-Capillary: 226 mg/dL — ABNORMAL HIGH (ref 70–99)
Glucose-Capillary: 56 mg/dL — ABNORMAL LOW (ref 70–99)
Glucose-Capillary: 85 mg/dL (ref 70–99)
Glucose-Capillary: 85 mg/dL (ref 70–99)
Glucose-Capillary: 99 mg/dL (ref 70–99)

## 2019-05-10 LAB — PHOSPHORUS: Phosphorus: 2.1 mg/dL — ABNORMAL LOW (ref 2.5–4.6)

## 2019-05-10 LAB — HEMOGLOBIN A1C
Hgb A1c MFr Bld: 5.6 % (ref 4.8–5.6)
Mean Plasma Glucose: 114 mg/dL

## 2019-05-10 MED ORDER — METFORMIN HCL 500 MG PO TABS: 500.0000 mg | ORAL_TABLET | Freq: Two times a day (BID) | ORAL | 0 refills | Status: DC

## 2019-05-10 MED ORDER — LIVING WELL WITH DIABETES BOOK
Freq: Once | Status: AC
  Administered 2019-05-10: 11:00:00
  Filled 2019-05-10: qty 1

## 2019-05-10 MED ORDER — MAGNESIUM SULFATE IN D5W 1-5 GM/100ML-% IV SOLN
1.0000 g | Freq: Once | INTRAVENOUS | Status: AC
  Administered 2019-05-10: 1 g via INTRAVENOUS
  Filled 2019-05-10: qty 100

## 2019-05-10 NOTE — Progress Notes (Signed)
Discharge instructions given and went over with patient and patients wife at bedside. All questions answered. Patient discharged home. Amamda Curbow S, RN  

## 2019-05-10 NOTE — Progress Notes (Signed)
Advanced care plan. Purpose of the Encounter: CODE STATUS Parties in Attendance: Patient Patient's Decision Capacity: Good Subjective/Patient's story: The patient with past medical history of diabetes and hypertension presents to the emergency department due to seizure-like activity.  The patient admits to loss of consciousness and his wife adds that he began to shake.  It is unclear how long the seizure may have lasted.  The patient has a history of alcohol abuse but stopped drinking for 10 years.  Then he recently drank "at least half a gallon of liquor".  Last drink was last week.  The patient received Ativan in the emergency department.  Head CT was negative for acute intracranial abnormality.  Laboratory evaluation significant for increased anion gap.  Glucose was barely elevated indicating ketoacidosis more consistent with alcohol intake and diabetes.  The patient received intravenous fluid prior to the emergency department staff calling the hospitalist service for admission Objective/Medical story Patient needs insulin drip for alcoholic ketoacidosis Needs IV fluids potassium supplementation Needs CIWA protocol for alcohol withdrawal Goals of care determination:  Advance care directives goals of care treatment plan discussed Patient for now wants everything done which includes CPR, intubation ventilator if the need arises CODE STATUS: Full code Time spent discussing advanced care planning: 16 minutes

## 2019-05-10 NOTE — Progress Notes (Signed)
Pt CBG of 56. Juice provided per hypoglycemic protocol. Recheck CBG is 85. Will continue to monitor.

## 2019-05-10 NOTE — Discharge Summary (Signed)
[facility] NAME: Derrick Jones    MR#:  [id number]  DATE OF BIRTH:  February 15, 1946  DATE OF ADMISSION:  05/09/2019 ADMITTING PHYSICIAN: Saundra Shelling, MD  DATE OF DISCHARGE: 05/10/2019  PRIMARY CARE PHYSICIAN: Cletis Athens, MD   ADMISSION DIAGNOSIS:  Alcoholic ketoacidosis [id number] Seizure ([place]) [R56.9]  DISCHARGE DIAGNOSIS:  Active Problems:   Alcoholic ketoacidosis Hypokalemia Hypomagnesemia Alcohol abuse  SECONDARY DIAGNOSIS:   Past Medical History:  Diagnosis Date  . Diabetes mellitus without complication ([place])   . GERD (gastroesophageal reflux disease)   . Hypertension      ADMITTING HISTORY The patient with past medical history of diabetes and hypertension presents to the emergency department due to seizure-like activity.  The patient admits to loss of consciousness and his wife adds that he began to shake.  It is unclear how long the seizure may have lasted.  The patient has a history of alcohol abuse but stopped drinking for 10 years.  Then he recently drank "at least half a gallon of liquor".  Last drink was last week.  The patient received Ativan in the emergency department.  Head CT was negative for acute intracranial abnormality.  Laboratory evaluation significant for increased anion gap.  Glucose was barely elevated indicating ketoacidosis more consistent with alcohol intake and diabetes.  The patient received intravenous fluid prior to the emergency department staff calling the hospitalist service for admission.   HOSPITAL COURSE:  Patient was evaluated with head CT which showed no acute abnormality.  Was started on insulin drip for alcoholic ketoacidosis.  Anion gap closed and insulin drip.. Patient received IV fluids and electrolytes were supplemented aggressively.  Patient had questionable seizure-like activity at the time of admission in the ER he was monitored for seizures but no new episodes of seizures noted in the  hospitalization.  His blood sugars improved.  Tobacco cessation was counseled to the patient and nicotine patch offered.  CONSULTS OBTAINED:  None  DRUG ALLERGIES:  No Known Allergies  DISCHARGE MEDICATIONS:   Allergies as of 05/10/2019   No Known Allergies     Medication List    STOP taking these medications   Aspirin-Caffeine 845-65 MG Pack     TAKE these medications   atenolol-chlorthalidone 100-25 MG tablet Commonly known as: TENORETIC Take 1 tablet by mouth daily.   metFORMIN 500 MG tablet Commonly known as: GLUCOPHAGE Take 1 tablet (500 mg total) by mouth 2 (two) times daily. Start taking on: May 12, 2019 What changed: These instructions start on May 12, 2019. If you are unsure what to do until then, ask your doctor or other care provider.   omeprazole 20 MG capsule Commonly known as: PRILOSEC Take 20 mg by mouth daily.       Today  Patient seen today No chest pain No shortness of breath Tolerating diet well VITAL SIGNS:  Blood pressure 121/73, pulse 80, temperature 98.6 F (37 C), temperature source Oral, resp. rate 16, height 5\' 9"  (1.753 m), weight 69.4 kg, SpO2 97 %.  I/O:    Intake/Output Summary (Last 24 hours) at 05/10/2019 1213 Last data filed at 05/10/2019 1101 Gross per 24 hour  Intake 3365.03 ml  Output 550 ml  Net 2815.03 ml    PHYSICAL EXAMINATION:  Physical Exam  GENERAL:  73 y.o.-year-old patient lying in the bed with no acute distress.  LUNGS: Normal breath sounds bilaterally, no wheezing, rales,rhonchi or crepitation. No use of accessory muscles of respiration.  CARDIOVASCULAR: S1, S2 normal. No murmurs, rubs, or gallops.  ABDOMEN: Soft, non-tender, non-distended. Bowel sounds present. No organomegaly or mass.  NEUROLOGIC: Moves all 4 extremities. PSYCHIATRIC: The patient is alert and oriented x 3.  SKIN: No obvious rash, lesion, or ulcer.   DATA REVIEW:   CBC Recent Labs  Lab 05/09/19 1019  WBC 8.3  HGB 12.5*   HCT 35.6*  PLT 201    Chemistries  Recent Labs  Lab 05/09/19 1019  05/10/19 0205 05/10/19 0615  NA 133*   < > 135  135  --   K 3.3*   < > 4.8  4.4  --   CL 97*   < > 102  103  --   CO2 26   < > 26  26  --   GLUCOSE 100*   < > 59*  73  --   BUN 6*   < > <5*  <5*  --   CREATININE 0.57*   < > 0.66  0.74  --   CALCIUM 8.2*   < > 8.5*  8.4*  --   MG 1.7  --   --  1.7  AST 20  --   --   --   ALT 30  --   --   --   ALKPHOS 39  --   --   --   BILITOT 0.7  --   --   --    < > = values in this interval not displayed.    Cardiac Enzymes No results for input(s): TROPONINI in the last 168 hours.  Microbiology Results  Results for orders placed or performed during the hospital encounter of 05/09/19  SARS Coronavirus 2 [facility] order, Performed in [facility] hospital lab) Nasopharyngeal Nasopharyngeal Swab     Status: None   Collection Time: 05/09/19  8:02 AM   Specimen: Nasopharyngeal Swab  Result Value Ref Range Status   SARS Coronavirus 2 NEGATIVE NEGATIVE Final    Comment: (NOTE) If result is NEGATIVE SARS-CoV-2 target nucleic acids are NOT DETECTED. The SARS-CoV-2 RNA is generally detectable in upper and lower  respiratory specimens during the acute phase of infection. The lowest  concentration of SARS-CoV-2 viral copies this assay can detect is 250  copies / mL. A negative result does not preclude SARS-CoV-2 infection  and should not be used as the sole basis for treatment or other  patient management decisions.  A negative result may occur with  improper specimen collection / handling, submission of specimen other  than nasopharyngeal swab, presence of viral mutation(s) within the  areas targeted by this assay, and inadequate number of viral copies  (<250 copies / mL). A negative result must be combined with clinical  observations, patient history, and epidemiological information. If result is POSITIVE SARS-CoV-2 target nucleic acids are DETECTED. The  SARS-CoV-2 RNA is generally detectable in upper and lower  respiratory specimens dur ing the acute phase of infection.  Positive  results are indicative of active infection with SARS-CoV-2.  Clinical  correlation with patient history and other diagnostic information is  necessary to determine patient infection status.  Positive results do  not rule out bacterial infection or co-infection with other viruses. If result is PRESUMPTIVE POSTIVE SARS-CoV-2 nucleic acids MAY BE PRESENT.   A presumptive positive result was obtained on the submitted specimen  and confirmed on repeat testing.  While 2019 novel coronavirus  (SARS-CoV-2) nucleic acids may be present in the submitted sample  additional confirmatory  testing may be necessary for epidemiological  and / or clinical management purposes  to differentiate between  SARS-CoV-2 and other Sarbecovirus currently known to infect humans.  If clinically indicated additional testing with an alternate test  methodology [phone]) is advised. The SARS-CoV-2 RNA is generally  detectable in upper and lower respiratory sp ecimens during the acute  phase of infection. The expected result is Negative. Fact Sheet for Patients:  StrictlyIdeas.no Fact Sheet for Healthcare Providers: BankingDealers.co.za This test is not yet approved or cleared by the [place] FDA and has been authorized for detection and/or diagnosis of SARS-CoV-2 by FDA under an Emergency Use Authorization (EUA).  This EUA will remain in effect (meaning this test can be used) for the duration of the COVID-19 declaration under Section 564(b)(1) of the Act, 21 U.S.C. section 360bbb-3(b)(1), unless the authorization is terminated or revoked sooner. Performed at [facility], [address], [place],  [address]     RADIOLOGY:  Ct Angio Head W Or Wo Contrast  Result Date: 05/09/2019 CLINICAL DATA:  Initial evaluation for  acute seizure. EXAM: CT ANGIOGRAPHY HEAD AND NECK TECHNIQUE: Multidetector CT imaging of the head and neck was performed using the standard protocol during bolus administration of intravenous contrast. Multiplanar CT image reconstructions and MIPs were obtained to evaluate the vascular anatomy. Carotid stenosis measurements (when applicable) are obtained utilizing NASCET criteria, using the distal internal carotid diameter as the denominator. CONTRAST:  61mL OMNIPAQUE IOHEXOL 350 MG/ML SOLN COMPARISON:  None. FINDINGS: CT HEAD FINDINGS Brain: Age-related cerebral atrophy with moderate chronic small vessel ischemic disease. No evidence for acute intracranial hemorrhage. No findings to suggest acute large vessel territory infarct. No mass lesion, midline shift, or mass effect. Ventricles are normal in size without evidence for hydrocephalus. No extra-axial fluid collection identified. Vascular: No hyperdense vessel identified.Scattered vascular calcifications noted within the carotid siphons. Skull: Scalp soft tissues demonstrate no acute abnormality. Calvarium intact. Sinuses/Orbits: Globes and orbital soft tissues within normal limits. Visualized paranasal sinuses are clear. No mastoid effusion. CTA NECK FINDINGS Aortic arch: Visualized aortic arch of normal caliber with normal branch pattern. Mild atheromatous plaque noted within the arch itself. No hemodynamically significant stenosis seen about the origin of the great vessels. Visible subclavian arteries widely patent. Right carotid system: Right CCA tortuous proximally but patent to the bifurcation without stenosis. Mixed plaque about the right bifurcation/proximal right ICA with mild 25% stenosis by NASCET criteria. Right ICA otherwise widely patent distally to the skull base without stenosis, dissection, or occlusion. Left carotid system: Left CCA patent from its origin to the bifurcation without stenosis. Eccentric mixed plaque at the origin of the left ICA  with up to 50% stenosis by NASCET criteria. Left ICA patent distally to the skull base without additional stenosis, dissection, or occlusion. Evaluation mildly limited by motion. Vertebral arteries: Both vertebral arteries arise from the subclavian arteries. Atheromatous plaque at the origin of the vertebral arteries with approximate 50% stenosis bilaterally. No made of a focal penetrating plaque at the proximal right V1 segment just distal to the origin (series 11, image 85). Multifocal mild to moderate narrowing about the proximal V2 segments related to adjacent uncovertebral disease. Vertebral arteries otherwise patent within the neck without hemodynamically significant stenosis or other acute vascular abnormality. Skeleton: No acute osseous finding. Chronic fracture about the right C3-4 facet noted. Conchae cavity at the superior endplate of T4 also chronic in appearance. No discrete osseous lesions. Moderate to advanced cervical spondylolysis at C4-5 through C6-7. Other  neck: No other acute soft tissue abnormality within the neck. Upper chest: Layering secretions noted within the subglottic trachea near the carina, suggesting at this patient may be risk for aspiration. Mild upper lobe centrilobular emphysema. Review of the MIP images confirms the above findings CTA HEAD FINDINGS Anterior circulation: Petrous segments widely patent. Mild scattered atheromatous plaque within the cavernous/supraclinoid ICAs without hemodynamically significant stenosis. A1 segments patent bilaterally. 3 mm focal outpouching arising from the anterior communicating artery suspicious for a small aneurysm. Anterior cerebral arteries widely patent to their distal aspects without stenosis. No M1 stenosis or occlusion. Negative MCA bifurcations. Distal MCA branches well perfused and symmetric. Posterior circulation: Right vertebral artery widely patent to the vertebrobasilar junction. Focal atheromatous plaque within the proximal left V4  segment with short-segment moderate approximate 50% stenosis. Left V4 otherwise widely patent. Patent right PICA. Left PICA not seen. Basilar widely patent to its distal aspect. Superior cerebral arteries patent bilaterally. Both of the posterior cerebral arteries primarily supplied via the basilar well perfused to their distal aspects. Venous sinuses: Patent. Anatomic variants: None significant. Review of the MIP images confirms the above findings IMPRESSION: 1. Negative CTA for emergent large vessel occlusion. 2. Atheromatous plaque about the carotid bifurcations bilaterally with associated stenosis of up to 50% on the left and 25% on the right. 3. Moderate approximate 50% stenoses about the origins of the vertebral arteries bilaterally, with additional short-segment 50% left V4 stenosis. 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 5. Layering secretions within the subglottic trachea near the carina, suggesting that this patient may be at risk for aspiration. 6. Emphysema. 7. Chronic fracture about the right C3-4 facet. Electronically Signed   By: Jeannine Boga M.D.   On: 05/09/2019 01:55   Ct Angio Neck W And/or Wo Contrast  Result Date: 05/09/2019 CLINICAL DATA:  Initial evaluation for acute seizure. EXAM: CT ANGIOGRAPHY HEAD AND NECK TECHNIQUE: Multidetector CT imaging of the head and neck was performed using the standard protocol during bolus administration of intravenous contrast. Multiplanar CT image reconstructions and MIPs were obtained to evaluate the vascular anatomy. Carotid stenosis measurements (when applicable) are obtained utilizing NASCET criteria, using the distal internal carotid diameter as the denominator. CONTRAST:  42mL OMNIPAQUE IOHEXOL 350 MG/ML SOLN COMPARISON:  None. FINDINGS: CT HEAD FINDINGS Brain: Age-related cerebral atrophy with moderate chronic small vessel ischemic disease. No evidence for acute intracranial hemorrhage. No findings to suggest acute large vessel  territory infarct. No mass lesion, midline shift, or mass effect. Ventricles are normal in size without evidence for hydrocephalus. No extra-axial fluid collection identified. Vascular: No hyperdense vessel identified.Scattered vascular calcifications noted within the carotid siphons. Skull: Scalp soft tissues demonstrate no acute abnormality. Calvarium intact. Sinuses/Orbits: Globes and orbital soft tissues within normal limits. Visualized paranasal sinuses are clear. No mastoid effusion. CTA NECK FINDINGS Aortic arch: Visualized aortic arch of normal caliber with normal branch pattern. Mild atheromatous plaque noted within the arch itself. No hemodynamically significant stenosis seen about the origin of the great vessels. Visible subclavian arteries widely patent. Right carotid system: Right CCA tortuous proximally but patent to the bifurcation without stenosis. Mixed plaque about the right bifurcation/proximal right ICA with mild 25% stenosis by NASCET criteria. Right ICA otherwise widely patent distally to the skull base without stenosis, dissection, or occlusion. Left carotid system: Left CCA patent from its origin to the bifurcation without stenosis. Eccentric mixed plaque at the origin of the left ICA with up to 50% stenosis by NASCET criteria. Left ICA patent  distally to the skull base without additional stenosis, dissection, or occlusion. Evaluation mildly limited by motion. Vertebral arteries: Both vertebral arteries arise from the subclavian arteries. Atheromatous plaque at the origin of the vertebral arteries with approximate 50% stenosis bilaterally. No made of a focal penetrating plaque at the proximal right V1 segment just distal to the origin (series 11, image 85). Multifocal mild to moderate narrowing about the proximal V2 segments related to adjacent uncovertebral disease. Vertebral arteries otherwise patent within the neck without hemodynamically significant stenosis or other acute vascular  abnormality. Skeleton: No acute osseous finding. Chronic fracture about the right C3-4 facet noted. Conchae cavity at the superior endplate of T4 also chronic in appearance. No discrete osseous lesions. Moderate to advanced cervical spondylolysis at C4-5 through C6-7. Other neck: No other acute soft tissue abnormality within the neck. Upper chest: Layering secretions noted within the subglottic trachea near the carina, suggesting at this patient may be risk for aspiration. Mild upper lobe centrilobular emphysema. Review of the MIP images confirms the above findings CTA HEAD FINDINGS Anterior circulation: Petrous segments widely patent. Mild scattered atheromatous plaque within the cavernous/supraclinoid ICAs without hemodynamically significant stenosis. A1 segments patent bilaterally. 3 mm focal outpouching arising from the anterior communicating artery suspicious for a small aneurysm. Anterior cerebral arteries widely patent to their distal aspects without stenosis. No M1 stenosis or occlusion. Negative MCA bifurcations. Distal MCA branches well perfused and symmetric. Posterior circulation: Right vertebral artery widely patent to the vertebrobasilar junction. Focal atheromatous plaque within the proximal left V4 segment with short-segment moderate approximate 50% stenosis. Left V4 otherwise widely patent. Patent right PICA. Left PICA not seen. Basilar widely patent to its distal aspect. Superior cerebral arteries patent bilaterally. Both of the posterior cerebral arteries primarily supplied via the basilar well perfused to their distal aspects. Venous sinuses: Patent. Anatomic variants: None significant. Review of the MIP images confirms the above findings IMPRESSION: 1. Negative CTA for emergent large vessel occlusion. 2. Atheromatous plaque about the carotid bifurcations bilaterally with associated stenosis of up to 50% on the left and 25% on the right. 3. Moderate approximate 50% stenoses about the origins of  the vertebral arteries bilaterally, with additional short-segment 50% left V4 stenosis. 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 5. Layering secretions within the subglottic trachea near the carina, suggesting that this patient may be at risk for aspiration. 6. Emphysema. 7. Chronic fracture about the right C3-4 facet. Electronically Signed   By: Jeannine Boga M.D.   On: 05/09/2019 01:55    Follow up with PCP in 1 week.  Management plans discussed with the patient, family and they are in agreement.  CODE STATUS: Full code    Code Status Orders  (From admission, onward)         Start     Ordered   05/09/19 0623  Full code  Continuous     05/09/19 0622        Code Status History    Date Active Date Inactive Code Status Order ID Comments User Context   05/09/2019 0139 05/09/2019 0622 Full Code [id number]  Gregor Hams, MD ED   Advance Care Planning Activity    Advance Directive Documentation     Most Recent Value  Type of Advance Directive  Healthcare Power of Attorney, Living will  Pre-existing out of facility DNR order (yellow form or pink MOST form)  -  "MOST" Form in Place?  -      [place]  THIS PATIENT ON DAY OF DISCHARGE: more than 34 minutes.   Saundra Shelling M.D on 05/10/2019 at 12:13 PM  Between 7am to 6pm - Pager - [phone]  After 6pm go to [email or web address] - password [email or web address] [facility]  Office  [phone]  CC: Primary care physician; Cletis Athens, MD  Note: This dictation was prepared with Dragon dictation along with smaller phrase technology. Any transcriptional errors that result from this process are unintentional.

## 2019-05-10 NOTE — Progress Notes (Signed)
Inpatient Diabetes Program Recommendations  AACE/ADA: New Consensus Statement on Inpatient Glycemic Control (2015)  Target Ranges:  Prepandial:   less than 140 mg/dL      Peak postprandial:   less than 180 mg/dL (1-2 hours)      Critically ill patients:  140 - 180 mg/dL   Lab Results  Component Value Date   GLUCAP 85 05/10/2019   HGBA1C 5.6 05/09/2019    Review of Glycemic Control  Diabetes history: DM2 Outpatient Diabetes medications: Metformin 500 mg bid Current orders for Inpatient glycemic control: Levemir 12 units + Novolog 3 units tid + Novolog resistant scale tid + hs 0-5 units  Inpatient Diabetes Program Recommendations:    -Discontinue Levemir & Novolog -Decrease Novolog correction scale to sensitive tid + hs 0-5 units  Spoke with patient regarding diabetes management @ home. Patient states he is under pressure due to his daughter is terminally ill with a brain tumor. Patient drank alcohol for 9 days and did not eat prior to coming to the hospital. Reviewed with patient need for nutrition and decrease sugars in diet and limit alcohol. Patient states he plans to quit drinking alcohol and reviewed with patient to discuss with his physician while in the hospital. A1c is 5.6 so patient may not require diabetes medications on discharge home.   Thank you, Nani Gasser. Sy Saintjean, RN, MSN, CDE  Diabetes Coordinator Inpatient Glycemic Control Team Team Pager [phone] (8am-5pm) 05/10/2019 9:33 AM

## 2019-05-11 LAB — GLUCOSE, CAPILLARY: Glucose-Capillary: 29 mg/dL — CL (ref 70–99)

## 2019-05-22 DIAGNOSIS — I10 Essential (primary) hypertension: Secondary | ICD-10-CM | POA: Diagnosis not present

## 2019-05-22 DIAGNOSIS — F329 Major depressive disorder, single episode, unspecified: Secondary | ICD-10-CM | POA: Diagnosis not present

## 2019-05-22 DIAGNOSIS — F17200 Nicotine dependence, unspecified, uncomplicated: Secondary | ICD-10-CM | POA: Diagnosis not present

## 2019-05-22 DIAGNOSIS — F419 Anxiety disorder, unspecified: Secondary | ICD-10-CM | POA: Diagnosis not present

## 2019-06-26 DIAGNOSIS — R6251 Failure to thrive (child): Secondary | ICD-10-CM | POA: Diagnosis not present

## 2019-06-26 DIAGNOSIS — Z0000 Encounter for general adult medical examination without abnormal findings: Secondary | ICD-10-CM | POA: Diagnosis not present

## 2019-06-26 DIAGNOSIS — K222 Esophageal obstruction: Secondary | ICD-10-CM | POA: Diagnosis not present

## 2019-06-26 DIAGNOSIS — E119 Type 2 diabetes mellitus without complications: Secondary | ICD-10-CM | POA: Diagnosis not present

## 2019-06-26 DIAGNOSIS — I10 Essential (primary) hypertension: Secondary | ICD-10-CM | POA: Diagnosis not present

## 2019-06-29 ENCOUNTER — Other Ambulatory Visit: Payer: Self-pay

## 2019-06-29 ENCOUNTER — Telehealth: Payer: Self-pay

## 2019-06-29 DIAGNOSIS — Z1211 Encounter for screening for malignant neoplasm of colon: Secondary | ICD-10-CM

## 2019-06-29 NOTE — Telephone Encounter (Signed)
Gastroenterology Pre-Procedure Review  Request Date: 07/21/19 Requesting Physician: Dr. Vicente Males  PATIENT REVIEW QUESTIONS: The patient responded to the following health history questions as indicated:    1. Are you having any GI issues? no 2. Do you have a personal history of Polyps? no 3. Do you have a family history of Colon Cancer or Polyps? no 4. Diabetes Mellitus? no 5. Joint replacements in the past 12 months?no 6. Major health problems in the past 3 [id number] ER Alcoholic ketodoisis 7. Any artificial heart valves, MVP, or defibrillator?no    MEDICATIONS & ALLERGIES:    Patient reports the following regarding taking any anticoagulation/antiplatelet therapy:   Plavix, Coumadin, Eliquis, Xarelto, Lovenox, Pradaxa, Brilinta, or Effient? no Aspirin? no  Patient confirms/reports the following medications:  Current Outpatient Medications  Medication Sig Dispense Refill  . atenolol-chlorthalidone (TENORETIC) 100-25 MG tablet Take 1 tablet by mouth daily.    . metFORMIN (GLUCOPHAGE) 500 MG tablet Take 1 tablet (500 mg total) by mouth 2 (two) times daily. 60 tablet 0  . omeprazole (PRILOSEC) 20 MG capsule Take 20 mg by mouth daily.     No current facility-administered medications for this visit.     Patient confirms/reports the following allergies:  No Known Allergies  No orders of the defined types were placed in this encounter.   AUTHORIZATION INFORMATION Primary Insurance: 1D#: Group #:  Secondary Insurance: 1D#: Group #:  SCHEDULE INFORMATION: Date: 07/21/19 Time: Location:[facility]

## 2019-06-30 DIAGNOSIS — R5381 Other malaise: Secondary | ICD-10-CM | POA: Diagnosis not present

## 2019-06-30 DIAGNOSIS — I10 Essential (primary) hypertension: Secondary | ICD-10-CM | POA: Diagnosis not present

## 2019-06-30 DIAGNOSIS — Z125 Encounter for screening for malignant neoplasm of prostate: Secondary | ICD-10-CM | POA: Diagnosis not present

## 2019-06-30 DIAGNOSIS — E7849 Other hyperlipidemia: Secondary | ICD-10-CM | POA: Diagnosis not present

## 2019-07-03 ENCOUNTER — Encounter: Payer: Self-pay | Admitting: *Deleted

## 2019-07-03 ENCOUNTER — Telehealth: Payer: Self-pay | Admitting: *Deleted

## 2019-07-03 NOTE — Telephone Encounter (Signed)
Received referral for lung cancer screening and contacted patient who reports smoking history of 1/2 ppd x 53 years. Patient is made aware that he does not meet current eligibility criteria. He is advised that if he continues to smoke that he likely will meet the criteria in the future.

## 2019-07-18 ENCOUNTER — Other Ambulatory Visit
Admission: RE | Admit: 2019-07-18 | Discharge: 2019-07-18 | Disposition: A | Discharge status: Home / Self Care | Payer: PPO | Source: Ambulatory Visit | Attending: Gastroenterology | Admitting: Gastroenterology

## 2019-07-18 DIAGNOSIS — Z20828 Contact with and (suspected) exposure to other viral communicable diseases: Secondary | ICD-10-CM | POA: Diagnosis not present

## 2019-07-18 DIAGNOSIS — R6251 Failure to thrive (child): Secondary | ICD-10-CM | POA: Diagnosis not present

## 2019-07-18 DIAGNOSIS — F329 Major depressive disorder, single episode, unspecified: Secondary | ICD-10-CM | POA: Diagnosis not present

## 2019-07-18 DIAGNOSIS — Z01812 Encounter for preprocedural laboratory examination: Secondary | ICD-10-CM | POA: Diagnosis not present

## 2019-07-18 DIAGNOSIS — I119 Hypertensive heart disease without heart failure: Secondary | ICD-10-CM | POA: Diagnosis not present

## 2019-07-18 DIAGNOSIS — F419 Anxiety disorder, unspecified: Secondary | ICD-10-CM | POA: Diagnosis not present

## 2019-07-18 LAB — SARS CORONAVIRUS 2 (TAT 6-24 HRS): SARS Coronavirus 2: NEGATIVE

## 2019-07-21 ENCOUNTER — Ambulatory Visit
Admission: RE | Admit: 2019-07-21 | Discharge: 2019-07-21 | Disposition: A | Discharge status: Home / Self Care | Payer: PPO | Attending: Gastroenterology | Admitting: Gastroenterology

## 2019-07-21 ENCOUNTER — Encounter
Admission: RE | Disposition: A | Discharge status: Home / Self Care | Payer: Self-pay | Source: Home / Self Care | Attending: Gastroenterology

## 2019-07-21 ENCOUNTER — Ambulatory Visit: Payer: PPO | Admitting: Anesthesiology

## 2019-07-21 ENCOUNTER — Encounter: Payer: Self-pay | Admitting: Anesthesiology

## 2019-07-21 ENCOUNTER — Other Ambulatory Visit: Payer: Self-pay

## 2019-07-21 DIAGNOSIS — Z1211 Encounter for screening for malignant neoplasm of colon: Secondary | ICD-10-CM | POA: Diagnosis not present

## 2019-07-21 DIAGNOSIS — Z79899 Other long term (current) drug therapy: Secondary | ICD-10-CM | POA: Diagnosis not present

## 2019-07-21 DIAGNOSIS — K5790 Diverticulosis of intestine, part unspecified, without perforation or abscess without bleeding: Secondary | ICD-10-CM | POA: Diagnosis not present

## 2019-07-21 DIAGNOSIS — I10 Essential (primary) hypertension: Secondary | ICD-10-CM | POA: Insufficient documentation

## 2019-07-21 DIAGNOSIS — F17210 Nicotine dependence, cigarettes, uncomplicated: Secondary | ICD-10-CM | POA: Diagnosis not present

## 2019-07-21 DIAGNOSIS — K219 Gastro-esophageal reflux disease without esophagitis: Secondary | ICD-10-CM | POA: Insufficient documentation

## 2019-07-21 DIAGNOSIS — E7800 Pure hypercholesterolemia, unspecified: Secondary | ICD-10-CM | POA: Diagnosis not present

## 2019-07-21 DIAGNOSIS — Z7984 Long term (current) use of oral hypoglycemic drugs: Secondary | ICD-10-CM | POA: Insufficient documentation

## 2019-07-21 DIAGNOSIS — K635 Polyp of colon: Secondary | ICD-10-CM

## 2019-07-21 DIAGNOSIS — D120 Benign neoplasm of cecum: Secondary | ICD-10-CM | POA: Diagnosis not present

## 2019-07-21 DIAGNOSIS — E119 Type 2 diabetes mellitus without complications: Secondary | ICD-10-CM | POA: Insufficient documentation

## 2019-07-21 DIAGNOSIS — D124 Benign neoplasm of descending colon: Secondary | ICD-10-CM | POA: Diagnosis not present

## 2019-07-21 DIAGNOSIS — K5730 Diverticulosis of large intestine without perforation or abscess without bleeding: Secondary | ICD-10-CM | POA: Insufficient documentation

## 2019-07-21 HISTORY — PX: COLONOSCOPY WITH PROPOFOL: SHX5780

## 2019-07-21 LAB — GLUCOSE, CAPILLARY: Glucose-Capillary: 100 mg/dL — ABNORMAL HIGH (ref 70–99)

## 2019-07-21 SURGERY — COLONOSCOPY WITH PROPOFOL
Anesthesia: General

## 2019-07-21 MED ORDER — PROPOFOL 500 MG/50ML IV EMUL
INTRAVENOUS | Status: DC | PRN
  Administered 2019-07-21: 100 ug/kg/min via INTRAVENOUS

## 2019-07-21 MED ORDER — SODIUM CHLORIDE 0.9 % IV SOLN
INTRAVENOUS | Status: DC
  Administered 2019-07-21: 1000 mL via INTRAVENOUS

## 2019-07-21 MED ORDER — PROPOFOL 10 MG/ML IV BOLUS
INTRAVENOUS | Status: DC | PRN
  Administered 2019-07-21 (×2): 30 mg via INTRAVENOUS
  Administered 2019-07-21: 50 mg via INTRAVENOUS
  Administered 2019-07-21: 30 mg via INTRAVENOUS

## 2019-07-21 MED ORDER — LIDOCAINE HCL (CARDIAC) PF 100 MG/5ML IV SOSY
PREFILLED_SYRINGE | INTRAVENOUS | Status: DC | PRN
  Administered 2019-07-21: 80 mg via INTRAVENOUS

## 2019-07-21 NOTE — Anesthesia Preprocedure Evaluation (Signed)
Anesthesia Evaluation  Patient identified by MRN, date of birth, ID band Patient awake    Reviewed: Allergy & Precautions, H&P , NPO status , Patient's Chart, lab work & pertinent test results  History of Anesthesia Complications Negative for: history of anesthetic complications  Airway Mallampati: II  TM Distance: >3 FB Neck ROM: full    Dental  (+) Chipped, Poor Dentition, Missing, Upper Dentures   Pulmonary COPD, Current Smoker,           Cardiovascular Exercise Tolerance: Good hypertension, (-) angina(-) Past MI      Neuro/Psych PSYCHIATRIC DISORDERS negative neurological ROS     GI/Hepatic Neg liver ROS, GERD  Medicated and Controlled,  Endo/Other  diabetes, Type 2  Renal/GU negative Renal ROS  negative genitourinary   Musculoskeletal   Abdominal   Peds  Hematology negative hematology ROS (+)   Anesthesia Other Findings Past Medical History: No date: Diabetes mellitus without complication (HCC) No date: GERD (gastroesophageal reflux disease) No date: Hypertension  Past Surgical History: No date: EYE SURGERY No date: FEMUR FRACTURE SURGERY; Left  BMI    Body Mass Index: 19.94 kg/m      Reproductive/Obstetrics negative OB ROS                             Anesthesia Physical Anesthesia Plan  ASA: III  Anesthesia Plan: General   Post-op Pain Management:    Induction: Intravenous  PONV Risk Score and Plan: Propofol infusion and TIVA  Airway Management Planned: Natural Airway and Nasal Cannula  Additional Equipment:   Intra-op Plan:   Post-operative Plan:   Informed Consent: I have reviewed the patients History and Physical, chart, labs and discussed the procedure including the risks, benefits and alternatives for the proposed anesthesia with the patient or authorized representative who has indicated his/her understanding and acceptance.     Dental Advisory  Given  Plan Discussed with: Anesthesiologist, CRNA and Surgeon  Anesthesia Plan Comments: (Patient consented for risks of anesthesia including but not limited to:  - adverse reactions to medications - risk of intubation if required - damage to teeth, lips or other oral mucosa - sore throat or hoarseness - Damage to heart, brain, lungs or loss of life  Patient voiced understanding.)        Anesthesia Quick Evaluation

## 2019-07-21 NOTE — Op Note (Addendum)
[facility] Gastroenterology Patient Name: Derrick Jones Procedure Date: 07/21/2019 10:25 AM MRN: [id number] Account #: [id number] Date of Birth: 04-20-1946 Admit Type: Outpatient Age: 73 Room: [facility] ENDO ROOM 3 Gender: Male Note Status: Finalized Procedure:             Colonoscopy Indications:           Screening for colorectal malignant neoplasm Providers:             Jonathon Bellows MD, MD Referring MD:          Cletis Athens, MD (Referring MD) Medicines:             Monitored Anesthesia Care Complications:         No immediate complications. Procedure:             Pre-Anesthesia Assessment:                        - Prior to the procedure, a History and Physical was                         performed, and patient medications, allergies and                         sensitivities were reviewed. The patient's tolerance                         of previous anesthesia was reviewed.                        - The risks and benefits of the procedure and the                         sedation options and risks were discussed with the                         patient. All questions were answered and informed                         consent was obtained.                        - ASA Grade Assessment: II - A patient with mild                         systemic disease.                        After obtaining informed consent, the colonoscope was                         passed under direct vision. Throughout the procedure,                         the patient's blood pressure, pulse, and oxygen                         saturations were monitored continuously. The                         Colonoscope was introduced through the anus and  advanced to the the cecum, identified by the                         appendiceal orifice. The colonoscopy was performed                         with ease. The patient tolerated the procedure well.                         The quality of the  bowel preparation was excellent. Findings:      The perianal and digital rectal examinations were normal.      Three sessile polyps were found in the descending colon. The polyps were       4 to 7 mm in size. These polyps were removed with a cold snare.       Resection and retrieval were complete.      A 10 mm polyp was found in the cecum. The polyp was sessile. The polyp       was removed with a cold snare. Resection and retrieval were complete.      Multiple small-mouthed diverticula were found in the sigmoid colon.      The exam was otherwise without abnormality on direct and retroflexion       views. Impression:            - Three 4 to 7 mm polyps in the descending colon,                         removed with a cold snare. Resected and retrieved.                        - One 10 mm polyp in the cecum, removed with a cold                         snare. Resected and retrieved.                        - Diverticulosis in the sigmoid colon.                        - The examination was otherwise normal on direct and                         retroflexion views. Recommendation:        - Discharge patient to home (with escort).                        - Resume previous diet.                        - Continue present medications.                        - Await pathology results.                        - Repeat colonoscopy for surveillance based on                         pathology results. Procedure Code(s):     ---  Professional ---                        [phone], Colonoscopy, flexible; with removal of                         tumor(s), polyp(s), or other lesion(s) by snare                         technique Diagnosis Code(s):     --- Professional ---                        Z12.11, Encounter for screening for malignant neoplasm                         of colon                        K57.30, Diverticulosis of large intestine without                         perforation or abscess without bleeding                         K63.5, Polyp of colon CPT copyright 2019 American Medical Association. All rights reserved. The codes documented in this report are preliminary and upon coder review may  be revised to meet current compliance requirements. Jonathon Bellows, MD Jonathon Bellows MD, MD 07/21/2019 11:04:40 AM This report has been signed electronically. Number of Addenda: 0 Note Initiated On: 07/21/2019 10:25 AM Scope Withdrawal Time: 0 hours 18 minutes 40 seconds  Total Procedure Duration: 0 hours 23 minutes 38 seconds  Estimated Blood Loss:  Estimated blood loss: none.      [facility]

## 2019-07-21 NOTE — Anesthesia Post-op Follow-up Note (Signed)
Anesthesia QCDR form completed.        

## 2019-07-21 NOTE — H&P (Signed)
Derrick Bellows, MD [address] [place], [place], [place], [address] [address], [place], [place], [place], [address] Phone: [phone]  Fax: [phone]  Primary Care Physician:  Cletis Athens, MD   Pre-Procedure History & Physical: HPI:  RANBIR YOHEY is a 73 y.o. male is here for an colonoscopy.   Past Medical History:  Diagnosis Date  . Diabetes mellitus without complication ([place])   . GERD (gastroesophageal reflux disease)   . Hypertension     Past Surgical History:  Procedure Laterality Date  . EYE SURGERY    . FEMUR FRACTURE SURGERY Left     Prior to Admission medications   Medication Sig Start Date End Date Taking? Authorizing Provider  atenolol-chlorthalidone (TENORETIC) 100-25 MG tablet Take 1 tablet by mouth daily.   Yes [provider]  omeprazole (PRILOSEC) 20 MG capsule Take 20 mg by mouth daily.   Yes [provider]  metFORMIN (GLUCOPHAGE) 500 MG tablet Take 1 tablet (500 mg total) by mouth 2 (two) times daily. 05/12/19 06/11/19  Saundra Shelling, MD    Allergies as of 07/03/2019  . (No Known Allergies)    Family History  Problem Relation Age of Onset  . Breast cancer Sister   . Ovarian cancer Sister     Social History   Socioeconomic History  . Marital status: Married    Spouse name: Not on file  . Number of children: Not on file  . Years of education: Not on file  . Highest education level: Not on file  Occupational History  . Not on file  Social Needs  . Financial resource strain: Not on file  . Food insecurity    Worry: Not on file    Inability: Not on file  . Transportation needs    Medical: Not on file    Non-medical: Not on file  Tobacco Use  . Smoking status: Current Every Day Smoker    Packs/day: 0.50    Years: 53.00    Pack years: 26.50    Types: Cigarettes  . Smokeless tobacco: Never Used  Substance and Sexual Activity  . Alcohol use: No  . Drug use: No  . Sexual activity: Not on file   Lifestyle  . Physical activity    Days per week: Not on file    Minutes per session: Not on file  . Stress: Not on file  Relationships  . Social Herbalist on phone: Not on file    Gets together: Not on file    Attends religious service: Not on file    Active member of club or organization: Not on file    Attends meetings of clubs or organizations: Not on file    Relationship status: Not on file  . Intimate partner violence    Fear of current or ex partner: Not on file    Emotionally abused: Not on file    Physically abused: Not on file    Forced sexual activity: Not on file  Other Topics Concern  . Not on file  Social History Narrative  . Not on file    Review of Systems: See HPI, otherwise negative ROS  Physical Exam: BP 107/76   Pulse 71   Temp (!) 96.1 F (35.6 C) (Tympanic)   Resp 20   Ht 5\' 9"  (1.753 m)   Wt 61.2 kg   SpO2 98%   BMI 19.94 kg/m  General:  Alert,  pleasant and cooperative in NAD Head:  Normocephalic and atraumatic. Neck:  Supple; no masses or thyromegaly. Lungs:  Clear throughout to auscultation, normal respiratory effort.    Heart:  +S1, +S2, Regular rate and rhythm, No edema. Abdomen:  Soft, nontender and nondistended. Normal bowel sounds, without guarding, and without rebound.   Neurologic:  Alert and  oriented x4;  grossly normal neurologically.  Impression/Plan: OAK[place] CRONISTER is here for an colonoscopy to be performed for Screening colonoscopy average risk   Risks, benefits, limitations, and alternatives regarding  colonoscopy have been reviewed with the patient.  Questions have been answered.  All parties agreeable.   Derrick Bellows, MD  07/21/2019, 10:19 AM

## 2019-07-21 NOTE — Anesthesia Postprocedure Evaluation (Signed)
Anesthesia Post Note  Patient: Derrick Jones  Procedure(s) Performed: COLONOSCOPY WITH PROPOFOL (N/A )  Patient location during evaluation: Endoscopy Anesthesia Type: General Level of consciousness: awake and alert Pain management: pain level controlled Vital Signs Assessment: post-procedure vital signs reviewed and stable Respiratory status: spontaneous breathing, nonlabored ventilation, respiratory function stable and patient connected to nasal cannula oxygen Cardiovascular status: blood pressure returned to baseline and stable Postop Assessment: no apparent nausea or vomiting Anesthetic complications: no     Last Vitals:  Vitals:   07/21/19 1116 07/21/19 1126  BP: 99/76 121/85  Pulse: 65 69  Resp: 14 17  Temp:    SpO2: 93% 98%    Last Pain:  Vitals:   07/21/19 1126  TempSrc:   PainSc: 0-No pain                 Precious Haws Narely Nobles

## 2019-07-21 NOTE — Transfer of Care (Signed)
Immediate Anesthesia Transfer of Care Note  Patient: Derrick Jones  Procedure(s) Performed: COLONOSCOPY WITH PROPOFOL (N/A )  Patient Location: PACU and Endoscopy Unit  Anesthesia Type:General  Level of Consciousness: awake, drowsy and patient cooperative  Airway & Oxygen Therapy: Patient Spontanous Breathing  Post-op Assessment: Report given to RN and Post -op Vital signs reviewed and stable  Post vital signs: Reviewed and stable  Last Vitals:  Vitals Value Taken Time  BP 95/65 07/21/19 1107  Temp 36.6 C 07/21/19 1106  Pulse 67 07/21/19 1109  Resp 13 07/21/19 1109  SpO2 99 % 07/21/19 1109  Vitals shown include unvalidated device data.  Last Pain:  Vitals:   07/21/19 1106  TempSrc: Temporal  PainSc: Asleep         Complications: No apparent anesthesia complications

## 2019-07-22 NOTE — Progress Notes (Signed)
Number not in service.

## 2019-07-24 ENCOUNTER — Encounter: Payer: Self-pay | Admitting: Gastroenterology

## 2019-07-24 LAB — SURGICAL PATHOLOGY

## 2019-08-20 ENCOUNTER — Encounter: Payer: Self-pay | Admitting: Gastroenterology

## 2019-10-10 DIAGNOSIS — E119 Type 2 diabetes mellitus without complications: Secondary | ICD-10-CM | POA: Diagnosis not present

## 2019-12-07 DIAGNOSIS — F419 Anxiety disorder, unspecified: Secondary | ICD-10-CM | POA: Diagnosis not present

## 2019-12-07 DIAGNOSIS — K222 Esophageal obstruction: Secondary | ICD-10-CM | POA: Diagnosis not present

## 2019-12-07 DIAGNOSIS — I119 Hypertensive heart disease without heart failure: Secondary | ICD-10-CM | POA: Diagnosis not present

## 2019-12-07 DIAGNOSIS — R6251 Failure to thrive (child): Secondary | ICD-10-CM | POA: Diagnosis not present

## 2019-12-18 ENCOUNTER — Telehealth: Payer: Self-pay | Admitting: *Deleted

## 2019-12-18 MED ORDER — OMEPRAZOLE 20 MG PO CPDR: 20.0000 mg | DELAYED_RELEASE_CAPSULE | Freq: Every day | ORAL | 1 refills | Status: DC

## 2019-12-18 NOTE — Telephone Encounter (Signed)
Received vm from pharmacy stating that omeprazole tablet was sent in and ins didn't cover tablet but will cover capsule- needed new rx for capsule

## 2019-12-19 ENCOUNTER — Other Ambulatory Visit: Payer: Self-pay

## 2019-12-19 ENCOUNTER — Ambulatory Visit (INDEPENDENT_AMBULATORY_CARE_PROVIDER_SITE_OTHER): Payer: PPO | Admitting: Gastroenterology

## 2019-12-19 VITALS — BP 91/57 | HR 64 | Temp 97.6°F | Ht 69.0 in | Wt 127.0 lb

## 2019-12-19 DIAGNOSIS — R1310 Dysphagia, unspecified: Secondary | ICD-10-CM | POA: Diagnosis not present

## 2019-12-19 NOTE — Progress Notes (Signed)
Derrick Bellows MD, MRCP(U.K) [address]  [place]  [place], [place] [address]  Main: [phone]  Fax: [phone]   Gastroenterology Consultation  Referring Provider:     Cletis Athens, MD Primary Care Physician:  Derrick Athens, MD Primary Gastroenterologist:  Dr. Jonathon Jones  Reason for Consultation:     Dysphagia         HPI:   Derrick Jones is a 74 y.o. y/o male referred for consultation & management  by Dr. Cletis Athens, MD.     Dysphagia: Onset and any progression: Few months Frequency: Every time he eats solid foods Foods affected : Solids Prior episodes of impaction: None History of asthma/allergy : None History of heartburn/Reflux : None Weight loss/weight gain : 40 pounds Prior EGD: No PPI/H2 blocker use : No Smoking history :. Yes   Colonoscopy 07/2019: 4 polyps resected, tubular adenomas.  Past Medical History:  Diagnosis Date  . Diabetes mellitus without complication ([place])   . GERD (gastroesophageal reflux disease)   . Hypertension     Past Surgical History:  Procedure Laterality Date  . COLONOSCOPY WITH PROPOFOL N/A 07/21/2019   Procedure: COLONOSCOPY WITH PROPOFOL;  Surgeon: Derrick Bellows, MD;  Location: [facility] ENDOSCOPY;  Service: Gastroenterology;  Laterality: N/A;  . EYE SURGERY    . FEMUR FRACTURE SURGERY Left     Prior to Admission medications   Medication Sig Start Date End Date Taking? Authorizing Provider  atenolol-chlorthalidone (TENORETIC) 100-25 MG tablet Take 1 tablet by mouth daily.    [provider]  metFORMIN (GLUCOPHAGE) 500 MG tablet Take 1 tablet (500 mg total) by mouth 2 (two) times daily. 05/12/19 06/11/19  Saundra Shelling, MD  omeprazole (PRILOSEC) 20 MG capsule Take 1 capsule (20 mg total) by mouth daily. 12/18/19   Derrick Athens, MD    Family History  Problem Relation Age of Onset  . Breast cancer Sister   . Ovarian cancer Sister      Social History   Tobacco Use  . Smoking status: Current Every Day Smoker      Packs/day: 0.50    Years: 53.00    Pack years: 26.50    Types: Cigarettes  . Smokeless tobacco: Never Used  Substance Use Topics  . Alcohol use: No  . Drug use: No    Allergies as of 12/19/2019  . (No Known Allergies)    Review of Systems:    All systems reviewed and negative except where noted in HPI.   Physical Exam:  There were no vitals taken for this visit. No LMP for male patient. Psych:  Alert and cooperative. Normal mood and affect. General:   Alert,  Well-developed, well-nourished, pleasant and cooperative in NAD Head:  Normocephalic and atraumatic. Eyes:  Sclera clear, no icterus.   Conjunctiva pink. Lungs:  Respirations even and unlabored.  Clear throughout to auscultation.   No wheezes, crackles, or rhonchi. No acute distress. Heart:  Regular rate and rhythm; no murmurs, clicks, rubs, or gallops. Abdomen:  Normal bowel sounds.  No bruits.  Soft, non-tender and non-distended without masses, hepatosplenomegaly or hernias noted.  No guarding or rebound tenderness.    Msk:  Symmetrical without gross deformities. Good, equal movement & strength bilaterally. Pulses:  Normal pulses noted. Extremities:  No clubbing or edema.  No cyanosis. Neurologic:  Alert and oriented x3;  grossly normal neurologically. Psych:  Alert and cooperative. Normal mood and affect.  Imaging Studies: No results found.  Assessment and Plan:   Derrick Vandale  Jones is a 74 y.o. y/o male has been referred for dysphagia.    Plan  1.  EGD next week  I have discussed alternative options, risks & benefits,  which include, but are not limited to, bleeding, infection, perforation,respiratory complication & drug reaction.  The patient agrees with this plan & written consent will be obtained.     Follow up in 4 weeks  Dr Derrick Bellows MD,MRCP(U.K)

## 2019-12-25 ENCOUNTER — Other Ambulatory Visit: Payer: Self-pay

## 2019-12-25 ENCOUNTER — Other Ambulatory Visit
Admission: RE | Admit: 2019-12-25 | Discharge: 2019-12-25 | Disposition: A | Discharge status: Home / Self Care | Payer: PPO | Source: Ambulatory Visit | Attending: Gastroenterology | Admitting: Gastroenterology

## 2019-12-25 DIAGNOSIS — Z20822 Contact with and (suspected) exposure to covid-19: Secondary | ICD-10-CM | POA: Insufficient documentation

## 2019-12-25 DIAGNOSIS — Z01812 Encounter for preprocedural laboratory examination: Secondary | ICD-10-CM | POA: Insufficient documentation

## 2019-12-25 LAB — SARS CORONAVIRUS 2 (TAT 6-24 HRS): SARS Coronavirus 2: NEGATIVE

## 2019-12-26 ENCOUNTER — Encounter: Payer: Self-pay | Admitting: Emergency Medicine

## 2019-12-26 ENCOUNTER — Other Ambulatory Visit: Payer: Self-pay

## 2019-12-26 DIAGNOSIS — F1910 Other psychoactive substance abuse, uncomplicated: Secondary | ICD-10-CM | POA: Diagnosis not present

## 2019-12-26 DIAGNOSIS — F10920 Alcohol use, unspecified with intoxication, uncomplicated: Secondary | ICD-10-CM | POA: Diagnosis not present

## 2019-12-26 DIAGNOSIS — Z7984 Long term (current) use of oral hypoglycemic drugs: Secondary | ICD-10-CM | POA: Diagnosis not present

## 2019-12-26 DIAGNOSIS — Z79899 Other long term (current) drug therapy: Secondary | ICD-10-CM | POA: Diagnosis not present

## 2019-12-26 DIAGNOSIS — Y904 Blood alcohol level of 80-99 mg/100 ml: Secondary | ICD-10-CM | POA: Diagnosis not present

## 2019-12-26 DIAGNOSIS — R339 Retention of urine, unspecified: Secondary | ICD-10-CM | POA: Diagnosis not present

## 2019-12-26 DIAGNOSIS — E119 Type 2 diabetes mellitus without complications: Secondary | ICD-10-CM | POA: Insufficient documentation

## 2019-12-26 DIAGNOSIS — F1010 Alcohol abuse, uncomplicated: Secondary | ICD-10-CM | POA: Diagnosis not present

## 2019-12-26 DIAGNOSIS — F1310 Sedative, hypnotic or anxiolytic abuse, uncomplicated: Secondary | ICD-10-CM | POA: Diagnosis not present

## 2019-12-26 LAB — CBC WITH DIFFERENTIAL/PLATELET
Abs Immature Granulocytes: 0.07 K/uL (ref 0.00–0.07)
Basophils Absolute: 0.1 K/uL (ref 0.0–0.1)
Basophils Relative: 0 %
Eosinophils Absolute: 0 K/uL (ref 0.0–0.5)
Eosinophils Relative: 0 %
HCT: 38.6 % — ABNORMAL LOW (ref 39.0–52.0)
Hemoglobin: 13.8 g/dL (ref 13.0–17.0)
Immature Granulocytes: 1 %
Lymphocytes Relative: 14 %
Lymphs Abs: 2.1 K/uL (ref 0.7–4.0)
MCH: 31.1 pg (ref 26.0–34.0)
MCHC: 35.8 g/dL (ref 30.0–36.0)
MCV: 86.9 fL (ref 80.0–100.0)
Monocytes Absolute: 0.4 K/uL (ref 0.1–1.0)
Monocytes Relative: 3 %
Neutro Abs: 12.2 K/uL — ABNORMAL HIGH (ref 1.7–7.7)
Neutrophils Relative %: 82 %
Platelets: 198 K/uL (ref 150–400)
RBC: 4.44 MIL/uL (ref 4.22–5.81)
RDW: 15 % (ref 11.5–15.5)
WBC: 14.8 K/uL — ABNORMAL HIGH (ref 4.0–10.5)
nRBC: 0 % (ref 0.0–0.2)

## 2019-12-26 LAB — URINALYSIS, COMPLETE (UACMP) WITH MICROSCOPIC
Bilirubin Urine: NEGATIVE
Glucose, UA: NEGATIVE mg/dL
Hgb urine dipstick: NEGATIVE
Ketones, ur: NEGATIVE mg/dL
Leukocytes,Ua: NEGATIVE
Nitrite: POSITIVE — AB
Protein, ur: NEGATIVE mg/dL
Specific Gravity, Urine: 1.004 — ABNORMAL LOW (ref 1.005–1.030)
Squamous Epithelial / HPF: NONE SEEN (ref 0–5)
pH: 6 (ref 5.0–8.0)

## 2019-12-26 LAB — URINE DRUG SCREEN, QUALITATIVE (ARMC ONLY)
Amphetamines, Ur Screen: NOT DETECTED
Barbiturates, Ur Screen: NOT DETECTED
Benzodiazepine, Ur Scrn: POSITIVE — AB
Cannabinoid 50 Ng, Ur ~~LOC~~: NOT DETECTED
Cocaine Metabolite,Ur ~~LOC~~: NOT DETECTED
MDMA (Ecstasy)Ur Screen: NOT DETECTED
Methadone Scn, Ur: NOT DETECTED
Opiate, Ur Screen: NOT DETECTED
Phencyclidine (PCP) Ur S: NOT DETECTED
Tricyclic, Ur Screen: NOT DETECTED

## 2019-12-26 LAB — COMPREHENSIVE METABOLIC PANEL
ALT: 17 U/L (ref 0–44)
AST: 37 U/L (ref 15–41)
Albumin: 4.2 g/dL (ref 3.5–5.0)
Alkaline Phosphatase: 46 U/L (ref 38–126)
Anion gap: 15 (ref 5–15)
BUN: 16 mg/dL (ref 8–23)
CO2: 24 mmol/L (ref 22–32)
Calcium: 8.8 mg/dL — ABNORMAL LOW (ref 8.9–10.3)
Chloride: 85 mmol/L — ABNORMAL LOW (ref 98–111)
Creatinine, Ser: 0.76 mg/dL (ref 0.61–1.24)
GFR calc Af Amer: >60 mL/min (ref >60)
GFR calc non Af Amer: >60 mL/min (ref >60)
Glucose, Bld: 196 mg/dL — ABNORMAL HIGH (ref 70–99)
Potassium: 3.8 mmol/L (ref 3.5–5.1)
Sodium: 124 mmol/L — ABNORMAL LOW (ref 135–145)
Total Bilirubin: 0.9 mg/dL (ref 0.3–1.2)
Total Protein: 7.3 g/dL (ref 6.5–8.1)

## 2019-12-26 LAB — ETHANOL: Alcohol, Ethyl (B): 94 mg/dL — ABNORMAL HIGH (ref <10)

## 2019-12-26 NOTE — ED Notes (Signed)
Bladder scanner result >555.  Pt encouraged to void but unable at this time. Dr. Archie Balboa aware.

## 2019-12-26 NOTE — ED Triage Notes (Addendum)
Pt presents to ED with difficulty voiding for the past 2 days. Pt did urinate a small amount about 1 hour ago per his visitor. No hx of the same. Denies abd pain or pressure. Pt states he has been drinking ETOH and taking xanax (not prescribed) for the past week. Pt tearful and states he would like some help with his alcohol and drug use.

## 2019-12-27 ENCOUNTER — Emergency Department
Admission: EM | Admit: 2019-12-27 | Discharge: 2019-12-27 | Disposition: A | Discharge status: Home / Self Care | Payer: PPO | Attending: Emergency Medicine | Admitting: Emergency Medicine

## 2019-12-27 DIAGNOSIS — R339 Retention of urine, unspecified: Secondary | ICD-10-CM

## 2019-12-27 DIAGNOSIS — F1910 Other psychoactive substance abuse, uncomplicated: Secondary | ICD-10-CM

## 2019-12-27 NOTE — BH Assessment (Signed)
Assessment Note  Derrick Jones is an 74 y.o. male presenting to [facility] ED voluntarily with his wife for issues with his urine and substance use. Per triage note Pt presents to ED with difficulty voiding for the past 2 days. Pt did urinate a small amount about 1 hour ago per his visitor. No hx of the same. Denies abd pain or pressure. Pt states he has been drinking ETOH and taking xanax (not prescribed) for the past week. Pt tearful and states he would like some help with his alcohol and drug use. During assessment patient was alert and oriented x4, pleasant and cooperative. Patient reported that he would like assistance with his alcohol use. He reported "I got to a point where I didn't use." Patient reported that he drinks alcohol twice a month. Patient reports amounts "when I drink I drink"  "3 cases of beer and 2 bottles of liquor." Patient reported age of first use "19." Patient reported his last date of use "last night, I drank 10 beers." Patient denies using any other substances, when asked about Benzos patients reports "I take those as prescribed." Patient reported in 2010 "I was over a year sober because I was helping my wife with Chemo." Patient reported that he is not interested in detox treatment and would prefer outpatient recourses to locate a addiction therapist. Patient denies SI/HI/AH/VH and does not appear to be responding to internal or external stimuli.  Patient was provided with a list of outpatient therapist in his area that potentially accept his insurance, patient was receptive with outpatient resources, if no additional medical necessity patient can discharge home with resources.  Diagnosis: Alcohol Use Disorder, Severe  Past Medical History:  Past Medical History:  Diagnosis Date  . Diabetes mellitus without complication ([place])   . GERD (gastroesophageal reflux disease)   . Hypertension     Past Surgical History:  Procedure Laterality Date  . COLONOSCOPY WITH PROPOFOL N/A  07/21/2019   Procedure: COLONOSCOPY WITH PROPOFOL;  Surgeon: Jonathon Bellows, MD;  Location: [facility] ENDOSCOPY;  Service: Gastroenterology;  Laterality: N/A;  . EYE SURGERY    . FEMUR FRACTURE SURGERY Left     Family History:  Family History  Problem Relation Age of Onset  . Breast cancer Sister   . Ovarian cancer Sister     Social History:  reports that he has been smoking cigarettes. He has a 53.00 pack-year smoking history. He has never used smokeless tobacco. He reports current alcohol use. He reports that he does not use drugs.  Additional Social History:  Alcohol / Drug Use Pain Medications: See MAR Prescriptions: See MAR Over the Counter: See MAR History of alcohol / drug use?: Yes Substance #1 Name of Substance 1: Alcohol 1 - Age of First Use: 12 1 - Amount (size/oz): "3 cases of beer, 2 bottles of liquor" 1 - Frequency: twice a month 1 - Last Use / Amount: 12/25/19  CIWA: CIWA-Ar BP: 128/87 Pulse Rate: 74 COWS:    Allergies: No Known Allergies  Home Medications: (Not in a hospital admission)   OB/GYN Status:  No LMP for male patient.  General Assessment Data Location of Assessment: [facility] ED TTS Assessment: In system Is this a Tele or Face-to-Face Assessment?: Face-to-Face Is this an Initial Assessment or a Re-assessment for this encounter?: Initial Assessment Patient Accompanied by:: Other(Wife) Language Other than English: No Living Arrangements: Other (Comment)(Private Residence) What gender do you identify as?: Male Marital status: Married Living Arrangements: Spouse/significant other Can pt return  to current living arrangement?: Yes Admission Status: Voluntary Is patient capable of signing voluntary admission?: Yes Referral Source: Self/Family/Friend Insurance type: [employer] Ad[employer]  Medical Screening Exam ([place]) Medical Exam completed: Yes  Crisis Care Plan Living Arrangements: Spouse/significant other Legal Guardian:  Other:(Self) Name of Psychiatrist: None Name of Therapist: None  Education Status Is patient currently in school?: No Is the patient employed, unemployed or receiving disability?: Receiving disability income  Risk to self with the past 6 months Suicidal Ideation: No Has patient been a risk to self within the past 6 months prior to admission? : No Suicidal Intent: No Has patient had any suicidal intent within the past 6 months prior to admission? : No Is patient at risk for suicide?: No Suicidal Plan?: No Has patient had any suicidal plan within the past 6 months prior to admission? : No Access to Means: No What has been your use of drugs/alcohol within the last 12 months?: Alcohol Previous Attempts/Gestures: No How many times?: 0 Triggers for Past Attempts: None known Intentional Self Injurious Behavior: None Family Suicide History: No Recent stressful life event(s): Other (Comment)(None reported) Persecutory voices/beliefs?: No Depression: No Substance abuse history and/or treatment for substance abuse?: Yes Suicide prevention information given to non-admitted patients: Not applicable  Risk to Others within the past 6 months Homicidal Ideation: No Does patient have any lifetime risk of violence toward others beyond the six months prior to admission? : No Thoughts of Harm to Others: No Current Homicidal Plan: No Access to Homicidal Means: No Identified Victim: None reported History of harm to others?: No Assessment of Violence: None Noted Violent Behavior Description: None Does patient have access to weapons?: No Criminal Charges Pending?: No Does patient have a court date: No Is patient on probation?: No  Psychosis Hallucinations: None noted Delusions: None noted  Mental Status Report Appearance/Hygiene: Unremarkable Eye Contact: Good Motor Activity: Freedom of movement Speech: Logical/coherent Level of Consciousness: Alert Mood: Pleasant Affect: Appropriate to  circumstance Anxiety Level: None Thought Processes: Coherent Judgement: Unimpaired Orientation: Person, Place, Time, Situation, Appropriate for developmental age Obsessive Compulsive Thoughts/Behaviors: None  Cognitive Functioning Concentration: Normal Memory: Recent Intact, Remote Intact Is patient IDD: No Insight: Good Impulse Control: Good Appetite: Good Have you had any weight changes? : No Change Sleep: No Change Total Hours of Sleep: 8 Vegetative Symptoms: None  ADLScreening [facility] Assessment Services) Patient's cognitive ability adequate to safely complete daily activities?: Yes Patient able to express need for assistance with ADLs?: Yes Independently performs ADLs?: Yes (appropriate for developmental age)  Prior Inpatient Therapy Prior Inpatient Therapy: No  Prior Outpatient Therapy Prior Outpatient Therapy: No Does patient have an ACCT team?: No Does patient have Intensive In-House Services?  : No Does patient have [facility] services? : No Does patient have P4CC services?: No  ADL Screening (condition at time of admission) Patient's cognitive ability adequate to safely complete daily activities?: Yes Is the patient deaf or have difficulty hearing?: No Does the patient have difficulty seeing, even when wearing glasses/contacts?: No Does the patient have difficulty concentrating, remembering, or making decisions?: No Patient able to express need for assistance with ADLs?: Yes Does the patient have difficulty dressing or bathing?: No Independently performs ADLs?: Yes (appropriate for developmental age) Does the patient have difficulty walking or climbing stairs?: No Weakness of Legs: None Weakness of Arms/Hands: None  Home Assistive Devices/Equipment Home Assistive Devices/Equipment: None  Therapy Consults (therapy consults require a physician order) PT Evaluation Needed: No OT Evalulation Needed: No  SLP Evaluation Needed: No Abuse/Neglect Assessment (Assessment  to be complete while patient is alone) Abuse/Neglect Assessment Can Be Completed: Yes Physical Abuse: Denies Verbal Abuse: Denies Sexual Abuse: Denies Exploitation of patient/patient's resources: Denies Self-Neglect: Denies Values / Beliefs Cultural Requests During Hospitalization: None Spiritual Requests During Hospitalization: None Consults Spiritual Care Consult Needed: No Transition of Care Team Consult Needed: No            Disposition: Patient was provided with a list of outpatient therapist in his area that potentially accept his insurance, patient was receptive with outpatient resources, if no additional medical necessity patient can discharge home with resources. Disposition Initial Assessment Completed for this Encounter: Yes Patient referred to: Outpatient clinic referral  On Site Evaluation by:   Reviewed with Physician:    Leonie Douglas MS [place] 12/27/2019 2:49 AM

## 2019-12-27 NOTE — ED Notes (Signed)
Education provided to pt and his spouse regarding foley care and bag drainage.  Also taught how to switch from standard bag to leg bag.  PT wife verbalized understanding.

## 2019-12-27 NOTE — ED Provider Notes (Signed)
[facility] Emergency Department Provider Note  ____________________________________________   First MD Initiated Contact with Patient 12/27/19 0122     (approximate)  I have reviewed the triage vital signs and the nursing notes.   HISTORY  Chief Complaint Urinary Retention and Alcohol Intoxication    HPI Derrick Jones is a 74 y.o. male with below list of previous medical conditions including alcohol abuse and Xanax abuse presents to the emergency department secondary to urinary retention.  Patient states that he has been unable to urinate since yesterday with exception of a small volume.  Patient denies any dysuria hematuria or fever.  Patient is also requesting detox from alcohol and Xanax.  Patient states that he buys Xanax off the street".  Patient states that he has been abusing Xanax for the past 4 months.  Patient denies any suicidal or homicidal ideation.       Past Medical History:  Diagnosis Date   Diabetes mellitus without complication ([place])    GERD (gastroesophageal reflux disease)    Hypertension     Patient Active Problem List   Diagnosis Date Noted   Alcoholic ketoacidosis [id number]   History of alcohol abuse 08/02/2017   Hypercholesterolemia 08/02/2017   Heartburn 07/02/2017   Prediabetes 05/27/2017   Adjustment disorder with mixed anxiety and depressed mood 05/11/2017   Anxiety 05/11/2017   Hypertension 05/11/2017   Insomnia 05/11/2017   History of gastric ulcer 05/11/2017   History of esophageal stricture 05/11/2017    Past Surgical History:  Procedure Laterality Date   COLONOSCOPY WITH PROPOFOL N/A 07/21/2019   Procedure: COLONOSCOPY WITH PROPOFOL;  Surgeon: Jonathon Bellows, MD;  Location: [facility] ENDOSCOPY;  Service: Gastroenterology;  Laterality: N/A;   EYE SURGERY     FEMUR FRACTURE SURGERY Left     Prior to Admission medications   Medication Sig Start Date End Date Taking? Authorizing Provider  albuterol  (VENTOLIN HFA) 108 (90 Base) MCG/ACT inhaler INHALE 1 PUFF BY MOUTH THREE TIMES DAILY AS NEEDED 10/30/19   [provider]  atenolol-chlorthalidone (TENORETIC) 100-25 MG tablet Take 1 tablet by mouth daily.    [provider]  atorvastatin (LIPITOR) 20 MG tablet Take 20 mg by mouth daily. 11/14/19   [provider]  metFORMIN (GLUCOPHAGE) 500 MG tablet Take 1 tablet (500 mg total) by mouth 2 (two) times daily. 05/12/19 06/11/19  Saundra Shelling, MD  omeprazole (PRILOSEC) 20 MG capsule Take 1 capsule (20 mg total) by mouth daily. 12/18/19   Cletis Athens, MD    Allergies Patient has no known allergies.  Family History  Problem Relation Age of Onset   Breast cancer Sister    Ovarian cancer Sister     Social History Social History   Tobacco Use   Smoking status: Current Every Day Smoker    Packs/day: 1.00    Years: 53.00    Pack years: 53.00    Types: Cigarettes   Smokeless tobacco: Never Used  Substance Use Topics   Alcohol use: Yes   Drug use: No    Review of Systems Constitutional: No fever/chills Eyes: No visual changes. ENT: No sore throat. Cardiovascular: Denies chest pain. Respiratory: Denies shortness of breath. Gastrointestinal: No abdominal pain.  No nausea, no vomiting.  No diarrhea.  No constipation. Genitourinary: Negative for dysuria. Musculoskeletal: Negative for neck pain.  Negative for back pain. Integumentary: Negative for rash. Neurological: Negative for headaches, focal weakness or numbness. Psychiatric:  Positive for alcohol and benzodiazepine abuse   ____________________________________________  PHYSICAL EXAM:  VITAL SIGNS: ED Triage Vitals  Enc Vitals Group     BP 12/26/19 2046 128/87     Pulse Rate 12/26/19 2046 74     Resp 12/26/19 2046 18     Temp 12/26/19 2046 97.6 F (36.4 C)     Temp Source 12/26/19 2046 Oral     SpO2 12/26/19 2046 96 %     Weight 12/26/19 2048 68 kg (150 lb)     Height 12/26/19 2048  1.753 m (5\' 9" )     Head Circumference --      Peak Flow --      Pain Score 12/26/19 2048 0     Pain Loc --      Pain Edu? --      Excl. in [place]? --     Constitutional: Alert and oriented.  Eyes: Conjunctivae are normal.  Mouth/Throat: Patient is wearing a mask. Neck: No stridor.  No meningeal signs.   Cardiovascular: Normal rate, regular rhythm. Good peripheral circulation. Grossly normal heart sounds. Respiratory: Normal respiratory effort.  No retractions. Gastrointestinal: Soft and nontender. No distention.  Musculoskeletal: No lower extremity tenderness nor edema. No gross deformities of extremities. Neurologic:  Normal speech and language. No gross focal neurologic deficits are appreciated.  Skin:  Skin is warm, dry and intact. Psychiatric: Mood and affect are normal. Speech and behavior are normal.  ____________________________________________   LABS (all labs ordered are listed, but only abnormal results are displayed)  Labs Reviewed  CBC WITH DIFFERENTIAL/PLATELET - Abnormal; Notable for the following components:      Result Value   WBC 14.8 (*)    HCT 38.6 (*)    Neutro Abs 12.2 (*)    All other components within normal limits  COMPREHENSIVE METABOLIC PANEL - Abnormal; Notable for the following components:   Sodium 124 (*)    Chloride 85 (*)    Glucose, Bld 196 (*)    Calcium 8.8 (*)    All other components within normal limits  ETHANOL - Abnormal; Notable for the following components:   Alcohol, Ethyl (B) 94 (*)    All other components within normal limits  URINALYSIS, COMPLETE (UACMP) WITH MICROSCOPIC - Abnormal; Notable for the following components:   Color, Urine AMBER (*)    APPearance CLEAR (*)    Specific Gravity, Urine 1.004 (*)    Nitrite POSITIVE (*)    Bacteria, UA RARE (*)    All other components within normal limits  URINE DRUG SCREEN, QUALITATIVE ([facility] ONLY) - Abnormal; Notable for the following components:   Benzodiazepine, Ur Scrn POSITIVE (*)     All other components within normal limits   ___________________________________________   Procedures   ____________________________________________   INITIAL IMPRESSION / MDM / ASSESSMENT AND PLAN / ED COURSE  As part of my medical decision making, I reviewed the following data within the electronic MEDICAL RECORD NUMBER   74 year old male presented with above-stated history and physical exam consistent with acute urinary tension for which a Foley catheter was inserted.  1800 mL of urine was obtained.  Patient also evaluated by the psychiatry staff for detox from alcohol and benzodiazepines.  Patient was given outpatient resources which he states he will follow with this morning. ____________________________________________  FINAL CLINICAL IMPRESSION(S) / ED DIAGNOSES  Final diagnoses:  Urinary retention  Polysubstance abuse ([place])     MEDICATIONS GIVEN DURING THIS VISIT:  Medications - No data to display   ED Discharge Orders    None      *  Please note:  Derrick Jones was evaluated in Emergency Department on 12/27/2019 for the symptoms described in the history of present illness. He was evaluated in the context of the global COVID-19 pandemic, which necessitated consideration that the patient might be at risk for infection with the SARS-CoV-2 virus that causes COVID-19. Institutional protocols and algorithms that pertain to the evaluation of patients at risk for COVID-19 are in a state of rapid change based on information released by regulatory bodies including the CDC and federal and state organizations. These policies and algorithms were followed during the patient's care in the ED.  Some ED evaluations and interventions may be delayed as a result of limited staffing during the pandemic.*  Note:  This document was prepared using Dragon voice recognition software and may include unintentional dictation errors.   Gregor Hams, MD 12/27/19 [id number]

## 2019-12-28 LAB — URINE CULTURE: Culture: NO GROWTH

## 2019-12-29 ENCOUNTER — Emergency Department
Admission: EM | Admit: 2019-12-29 | Discharge: 2019-12-29 | Disposition: A | Discharge status: Home / Self Care | Payer: PPO | Source: Home / Self Care | Attending: Emergency Medicine | Admitting: Emergency Medicine

## 2019-12-29 ENCOUNTER — Emergency Department
Admission: EM | Admit: 2019-12-29 | Discharge: 2019-12-29 | Disposition: A | Discharge status: Home / Self Care | Payer: PPO | Attending: Emergency Medicine | Admitting: Emergency Medicine

## 2019-12-29 ENCOUNTER — Encounter: Payer: Self-pay | Admitting: Emergency Medicine

## 2019-12-29 ENCOUNTER — Other Ambulatory Visit: Payer: Self-pay

## 2019-12-29 DIAGNOSIS — I10 Essential (primary) hypertension: Secondary | ICD-10-CM | POA: Insufficient documentation

## 2019-12-29 DIAGNOSIS — Z7984 Long term (current) use of oral hypoglycemic drugs: Secondary | ICD-10-CM | POA: Insufficient documentation

## 2019-12-29 DIAGNOSIS — R58 Hemorrhage, not elsewhere classified: Secondary | ICD-10-CM | POA: Diagnosis not present

## 2019-12-29 DIAGNOSIS — Z5321 Procedure and treatment not carried out due to patient leaving prior to being seen by health care provider: Secondary | ICD-10-CM | POA: Insufficient documentation

## 2019-12-29 DIAGNOSIS — R040 Epistaxis: Secondary | ICD-10-CM

## 2019-12-29 DIAGNOSIS — F17210 Nicotine dependence, cigarettes, uncomplicated: Secondary | ICD-10-CM | POA: Insufficient documentation

## 2019-12-29 DIAGNOSIS — Z79899 Other long term (current) drug therapy: Secondary | ICD-10-CM | POA: Insufficient documentation

## 2019-12-29 DIAGNOSIS — E119 Type 2 diabetes mellitus without complications: Secondary | ICD-10-CM | POA: Insufficient documentation

## 2019-12-29 LAB — BASIC METABOLIC PANEL
Anion gap: 12 (ref 5–15)
BUN: 12 mg/dL (ref 8–23)
CO2: 28 mmol/L (ref 22–32)
Calcium: 9 mg/dL (ref 8.9–10.3)
Chloride: 86 mmol/L — ABNORMAL LOW (ref 98–111)
Creatinine, Ser: 0.86 mg/dL (ref 0.61–1.24)
GFR calc Af Amer: >60 mL/min (ref >60)
GFR calc non Af Amer: >60 mL/min (ref >60)
Glucose, Bld: 129 mg/dL — ABNORMAL HIGH (ref 70–99)
Potassium: 3.8 mmol/L (ref 3.5–5.1)
Sodium: 126 mmol/L — ABNORMAL LOW (ref 135–145)

## 2019-12-29 LAB — CBC
HCT: 36.7 % — ABNORMAL LOW (ref 39.0–52.0)
Hemoglobin: 12.6 g/dL — ABNORMAL LOW (ref 13.0–17.0)
MCH: 31.3 pg (ref 26.0–34.0)
MCHC: 34.3 g/dL (ref 30.0–36.0)
MCV: 91.3 fL (ref 80.0–100.0)
Platelets: 238 10*3/uL (ref 150–400)
RBC: 4.02 MIL/uL — ABNORMAL LOW (ref 4.22–5.81)
RDW: 14.7 % (ref 11.5–15.5)
WBC: 10.5 10*3/uL (ref 4.0–10.5)
nRBC: 0 % (ref 0.0–0.2)

## 2019-12-29 MED ORDER — BACITRACIN-NEOMYCIN-POLYMYXIN OINTMENT TUBE
TOPICAL_OINTMENT | Freq: Every day | CUTANEOUS | Status: DC
  Filled 2019-12-29: qty 14.17

## 2019-12-29 MED ORDER — OXYMETAZOLINE HCL 0.05 % NA SOLN: 1.0000 | Freq: Once | NASAL | Status: DC

## 2019-12-29 MED ORDER — OXYMETAZOLINE HCL 0.05 % NA SOLN
2.0000 | Freq: Once | NASAL | Status: AC
  Administered 2019-12-29: 2 via NASAL
  Filled 2019-12-29: qty 30

## 2019-12-29 NOTE — ED Triage Notes (Signed)
Pt here for intermittent nose bleeds for past 2 days. No blood thinners.  From both nares.  Unlabored. VSS.  No hx of same.  Is still having scant bleeding at this time.  NAD

## 2019-12-29 NOTE — ED Notes (Signed)
Pt called multiple times for room by this RN. Pt not visualized in department by this RN or staff.

## 2019-12-29 NOTE — ED Provider Notes (Signed)
[facility] Emergency Department Provider Note  ____________________________________________  Time seen: Approximately 8:35 PM  I have reviewed the triage vital signs and the nursing notes.   HISTORY  Chief Complaint Epistaxis    HPI Derrick Jones is a 74 y.o. male that presents to the emergency department for evaluation of nosebleed today.  Patient states that he woke up today and his wife commented that there was blood on his face.  He noticed that his nose was bleeding.  He called EMS and came to the emergency department.  He left the ER prior to being seen because his nose had stopped bleeding.  When he got home, his nose started bleeding again.  He has been dabbing Kleenex is up his nose.  He denies any trauma.  No history of nosebleeds.  He does not take any blood thinners.  No headache, facial pain.  Past Medical History:  Diagnosis Date  . Diabetes mellitus without complication ([place])   . GERD (gastroesophageal reflux disease)   . Hypertension     Patient Active Problem List   Diagnosis Date Noted  . Alcoholic ketoacidosis [id number]  . History of alcohol abuse 08/02/2017  . Hypercholesterolemia 08/02/2017  . Heartburn 07/02/2017  . Prediabetes 05/27/2017  . Adjustment disorder with mixed anxiety and depressed mood 05/11/2017  . Anxiety 05/11/2017  . Hypertension 05/11/2017  . Insomnia 05/11/2017  . History of gastric ulcer 05/11/2017  . History of esophageal stricture 05/11/2017    Past Surgical History:  Procedure Laterality Date  . COLONOSCOPY WITH PROPOFOL N/A 07/21/2019   Procedure: COLONOSCOPY WITH PROPOFOL;  Surgeon: Jonathon Bellows, MD;  Location: [facility] ENDOSCOPY;  Service: Gastroenterology;  Laterality: N/A;  . EYE SURGERY    . FEMUR FRACTURE SURGERY Left     Prior to Admission medications   Medication Sig Start Date End Date Taking? Authorizing Provider  albuterol (VENTOLIN HFA) 108 (90 Base) MCG/ACT inhaler INHALE 1 PUFF BY MOUTH  THREE TIMES DAILY AS NEEDED 10/30/19   [provider]  atenolol-chlorthalidone (TENORETIC) 100-25 MG tablet Take 1 tablet by mouth daily.    [provider]  atorvastatin (LIPITOR) 20 MG tablet Take 20 mg by mouth daily. 11/14/19   [provider]  metFORMIN (GLUCOPHAGE) 500 MG tablet Take 1 tablet (500 mg total) by mouth 2 (two) times daily. 05/12/19 06/11/19  Saundra Shelling, MD  omeprazole (PRILOSEC) 20 MG capsule Take 1 capsule (20 mg total) by mouth daily. 12/18/19   Cletis Athens, MD    Allergies Patient has no known allergies.  Family History  Problem Relation Age of Onset  . Breast cancer Sister   . Ovarian cancer Sister     Social History Social History   Tobacco Use  . Smoking status: Current Every Day Smoker    Packs/day: 1.00    Years: 53.00    Pack years: 53.00    Types: Cigarettes  . Smokeless tobacco: Never Used  Substance Use Topics  . Alcohol use: Yes  . Drug use: No     Review of Systems  Cardiovascular: No chest pain. Respiratory: No SOB. Gastrointestinal: No abdominal pain.  No nausea, no vomiting.  Musculoskeletal: Negative for musculoskeletal pain. Skin: Negative for rash, abrasions, lacerations, ecchymosis. Neurological: Negative for headaches   ____________________________________________   PHYSICAL EXAM:  VITAL SIGNS: ED Triage Vitals [12/29/19 1857]  Enc Vitals Group     BP (!) 162/87     Pulse Rate 80     Resp 18  Temp 97.6 F (36.4 C)     Temp Source Oral     SpO2 98 %     Weight 150 lb (68 kg)     Height 5\' 9"  (1.753 m)     Head Circumference      Peak Flow      Pain Score 0     Pain Loc      Pain Edu?      Excl. in [place]?      Constitutional: Alert and oriented. Well appearing and in no acute distress. Eyes: Conjunctivae are normal. PERRL. EOMI. Head: Atraumatic. ENT:      Ears:      Nose: No congestion/rhinnorhea.  No blood to right nares.  Minimal blood to left nares.  Patient holding a  conversation without any active bleeding from nostrils.      Mouth/Throat: Mucous membranes are moist.  Neck: No stridor.   Cardiovascular: Normal rate, regular rhythm.  Good peripheral circulation. Respiratory: Normal respiratory effort without tachypnea or retractions. Lungs CTAB. Good air entry to the bases with no decreased or absent breath sounds. Musculoskeletal: Full range of motion to all extremities. No gross deformities appreciated. Neurologic:  Normal speech and language. No gross focal neurologic deficits are appreciated.  Skin:  Skin is warm, dry and intact. No rash noted. Psychiatric: Mood and affect are normal. Speech and behavior are normal. Patient exhibits appropriate insight and judgement.   ____________________________________________   LABS (all labs ordered are listed, but only abnormal results are displayed)  Labs Reviewed - No data to display ____________________________________________  EKG   ____________________________________________  RADIOLOGY   No results found.  ____________________________________________    PROCEDURES  Procedure(s) performed:    Procedures    Medications  neomycin-bacitracin-polymyxin (NEOSPORIN) ointment ( Topical Given 12/29/19 2135)  oxymetazoline (AFRIN) 0.05 % nasal spray 2 spray (2 sprays Each Nare Given 12/29/19 2053)     ____________________________________________   INITIAL IMPRESSION / ASSESSMENT AND PLAN / ED COURSE  Pertinent labs & imaging results that were available during my care of the patient were reviewed by me and considered in my medical decision making (see chart for details).  Review of the [place] CSRS was performed in accordance of the [place] prior to dispensing any controlled drugs.     Patient presented to the emergency department for evaluation of nosebleed.  Vital signs and exam are reassuring.  Patient has some blood to his left nares and then to his right.  I suspect that nose has  continued to minimally bleed because patient continues to rub Kleenex in his nose and pick at area.  Patient was discouraged from doing this.  Afrin was applied.  Neosporin was placed.  Patient has not had any continued bleeding.  Patient will be discharged home with prescriptions for Afrin. Patient is to follow up with PCP as directed. Patient is given ED precautions to return to the ED for any worsening or new symptoms.  Derrick Jones was evaluated in Emergency Department on 12/29/2019 for the symptoms described in the history of present illness. He was evaluated in the context of the global COVID-19 pandemic, which necessitated consideration that the patient might be at risk for infection with the SARS-CoV-2 virus that causes COVID-19. Institutional protocols and algorithms that pertain to the evaluation of patients at risk for COVID-19 are in a state of rapid change based on information released by regulatory bodies including the CDC and federal and state organizations. These policies and algorithms were followed  during the patient's care in the ED.   ____________________________________________  FINAL CLINICAL IMPRESSION(S) / ED DIAGNOSES  Final diagnoses:  Epistaxis      NEW MEDICATIONS STARTED DURING THIS VISIT:  ED Discharge Orders    None          This chart was dictated using voice recognition software/Dragon. Despite best efforts to proofread, errors can occur which can change the meaning. Any change was purely unintentional.    Laban Emperor, PA-C 12/29/19 2325    Arta Silence, MD 12/30/19 0021

## 2019-12-29 NOTE — ED Triage Notes (Signed)
First RN Note: Pt presents to ED via [facility] with c/o epistaxis that started this morning and was unable to get it stop. Per EMS no trauma, 2 sprays afrin given to each nostril and bleeding somewhat controlled since then. EMS reports intermittently nose bleed since having foley placed on 5/11.

## 2019-12-29 NOTE — ED Triage Notes (Signed)
Pt arrives via POV for c/o nose bleed that lasted 30 mins around 1500. Pt seen earlier for the same and states when he got home his nose started bleeding again. Pt in NAD, bleeding controlled.

## 2020-01-02 ENCOUNTER — Other Ambulatory Visit: Payer: Self-pay | Admitting: Internal Medicine

## 2020-01-03 ENCOUNTER — Ambulatory Visit: Payer: PPO | Admitting: Urology

## 2020-01-03 ENCOUNTER — Other Ambulatory Visit: Payer: Self-pay | Admitting: *Deleted

## 2020-01-03 MED ORDER — METFORMIN HCL 500 MG PO TABS: 500.0000 mg | ORAL_TABLET | Freq: Every day | ORAL | 0 refills | Status: DC

## 2020-01-03 NOTE — Progress Notes (Signed)
01/04/20 8:25 AM   Derrick Jones Derrick Jones 01/07/1946 [id number]  Referring provider: Cletis Athens, MD [address] [place],  [place] [address] Chief Complaint  Patient presents with  . Urinary Retention    HPI: Derrick Jones is a 74 y.o. M who presents today for the evaluation and management of urinary retention.   -Visited ED on 12/27/19 c/o urinary retention secondary to EtOH and Xanax abuse - onset 1 day before visit.   -Xanax use for past 4 months  -Foley catheter inserted, 1800 mL of urine obtained -Sudden onset of urinary retention -No previous hx of urinary retention or catheter use -No previous baseline urinary symptoms -No previous visit w/ urologist   PMH: Past Medical History:  Diagnosis Date  . Diabetes mellitus without complication ([place])   . GERD (gastroesophageal reflux disease)   . Hypertension     Surgical History: Past Surgical History:  Procedure Laterality Date  . COLONOSCOPY WITH PROPOFOL N/A 07/21/2019   Procedure: COLONOSCOPY WITH PROPOFOL;  Surgeon: Derrick Bellows, MD;  Location: [facility] ENDOSCOPY;  Service: Gastroenterology;  Laterality: N/A;  . EYE SURGERY    . FEMUR FRACTURE SURGERY Left     Home Medications:  Allergies as of 01/04/2020   No Known Allergies     Medication List       Accurate as of Jan 04, 2020  8:25 AM. If you have any questions, ask your nurse or doctor.        albuterol 108 (90 Base) MCG/ACT inhaler Commonly known as: VENTOLIN HFA INHALE 1 PUFF BY MOUTH THREE TIMES DAILY AS NEEDED   atenolol-chlorthalidone 100-25 MG tablet Commonly known as: TENORETIC Take 1 tablet by mouth daily.   atorvastatin 20 MG tablet Commonly known as: LIPITOR Take 20 mg by mouth daily.   metFORMIN 500 MG tablet Commonly known as: GLUCOPHAGE Take 1 tablet (500 mg total) by mouth daily.   omeprazole 20 MG capsule Commonly known as: PRILOSEC Take 1 capsule (20 mg total) by mouth daily.       Allergies: No Known Allergies  Family History:  Family History  Problem Relation Age of Onset  . Breast cancer Sister   . Ovarian cancer Sister     Social History:  reports that he has been smoking cigarettes. He has a 53.00 pack-year smoking history. He has never used smokeless tobacco. He reports current alcohol use. He reports that he does not use drugs.   Physical Exam: BP 115/78   Pulse 73   Ht 5\' 9"  (1.753 m)   Wt 150 lb (68 kg)   BMI 22.15 kg/m   Constitutional:  Alert and oriented, No acute distress. HEENT: [place] AT, moist mucus membranes.  Trachea midline, no masses. Cardiovascular: No clubbing, cyanosis, or edema. Respiratory: Normal respiratory effort, no increased work of breathing. GU: No CVA tenderness. Normal external genitalia.  Rectal: 50 g prostate, no nodules or tenderness  Skin: No rashes, bruises or suspicious lesions. Neurologic: Grossly intact, no focal deficits, moving all 4 extremities. Psychiatric: Normal mood and affect.  Laboratory Data:  Lab Results  Component Value Date   CREATININE 0.86 12/29/2019   Assessment & Plan:    1. Urinary retention  Foley catheter inserted on 12/27/19, 1800 mL of urine drained Catheter removed today Will f/u later today w/ PA-C for bladder scan   [facility] [address], [place], [place] [address] [phone]  I, Derrick Jones, am acting as a scribe for Dr. Nicki Reaper C. Jones,  I have reviewed the above documentation for accuracy and completeness, and I agree with the above.   Derrick Sons, MD

## 2020-01-04 ENCOUNTER — Ambulatory Visit (INDEPENDENT_AMBULATORY_CARE_PROVIDER_SITE_OTHER): Payer: PPO | Admitting: Physician Assistant

## 2020-01-04 ENCOUNTER — Ambulatory Visit (INDEPENDENT_AMBULATORY_CARE_PROVIDER_SITE_OTHER): Payer: PPO | Admitting: Urology

## 2020-01-04 ENCOUNTER — Encounter: Payer: Self-pay | Admitting: Urology

## 2020-01-04 ENCOUNTER — Other Ambulatory Visit: Payer: Self-pay

## 2020-01-04 VITALS — BP 115/78 | HR 73 | Ht 69.0 in | Wt 150.0 lb

## 2020-01-04 DIAGNOSIS — N401 Enlarged prostate with lower urinary tract symptoms: Secondary | ICD-10-CM

## 2020-01-04 DIAGNOSIS — R338 Other retention of urine: Secondary | ICD-10-CM

## 2020-01-04 LAB — BLADDER SCAN AMB NON-IMAGING
Scan Result: 250
Scan Result: 70

## 2020-01-04 NOTE — Progress Notes (Signed)
Patient returned to clinic in the afternoon.  He reports drinking approximately 1/2 gallon of water throughout the day.  He has been able to void multiple times.  PVR 70 mL.  Voiding trial passed.  Patient again denies a history of hesitancy, straining, weak stream, split stream, and intermittency.  Suspect urinary retention was medication induced.  Counseled patient on signs and symptoms of urinary retention including inability to urinate, low urinary output, urinary frequency, lower abdominal pain, low back pain, and abdominal distention.  Counseled him to follow-up in our clinic immediately or proceed to the emergency room if he develops the symptoms.  He expressed understanding.  Debroah Loop, PA-C  01/04/20 2:34 PM

## 2020-01-04 NOTE — Patient Instructions (Signed)
Acute Urinary Retention, Male  Acute urinary retention is a condition in which a person is unable to pass urine. This can last for a short time or for a long time. If left untreated, it can result in kidney damage or other serious complications. What are the causes? This condition may be caused by:  Obstruction or narrowing of the tube that drains the bladder (urethra). This may be caused by surgery or problems with nearby organs, such as the prostate gland, which can press or squeeze the urethra.  Problems with the nerves in the bladder. These can be caused by diseases, such as multiple sclerosis, or by spinal cord injuries.  Certain medicines.  Tumors in the area of the pelvis, bladder, or urethra.  Diabetes.  Degenerative cognitive conditions such as delirium or dementia.  Bladder or urinary tract infection.  Constipation.  Blood in the urine (hematuria).  Injury to the bladder or urethra.  Psychological (psychogenic) conditions. Someone may hold his urine due to trauma or because he does not want to use the bathroom. What increases the risk? This condition is more likely to develop in older men. As men age, their prostate may become larger and may start pressing or squeezing on the bladder or the urethra. What are the signs or symptoms? Symptoms of this condition include:  Trouble urinating.  Pain in the lower abdomen. Symptoms usually come on slowly over a long period of time. How is this diagnosed? This condition is diagnosed based on a physical exam and a medical history. You may also have other tests, including:  An ultrasound of the bladder or kidneys or both.  Blood tests.  A urine analysis.  Additional tests may be needed such as an MRI, kidney, or bladder function tests. How is this treated? Treatment for this condition may include:  Medicines.  Placing a thin, sterile tube (catheter) into the bladder to drain urine out of the body. This is called an  indwelling urinary catheter. After being inserted, the catheter is held in place with a small balloon that is filled with sterile water. Urine drains from the catheter into a collection bag outside of the body.  Behavioral therapy.  Treatment for any underlying conditions.  If needed, you may be treated in the hospital for kidney function problems or to manage other complications. Follow these instructions at home:  Take over-the-counter and prescription medicines only as told by your health care provider. Avoid certain medicines, such as decongestants, antihistamines, and some prescription medicines. Do not take any medicine unless your health care provider has approved.  If you were given an indwelling urinary catheter, take care of it as told by your health care provider.  Drink enough fluid to keep your urine clear or pale yellow.  If you were prescribed an antibiotic, take it as told by your health care provider. Do not stop taking the antibiotic even if you start to feel better.  Do not use any products that contain nicotine or tobacco, such as cigarettes and e-cigarettes. If you need help quitting, ask your health care provider.  Monitor any changes in your symptoms. Tell your health care provider about any changes.  If instructed, monitor your blood pressure at home. Report changes as told by your health care provider.  Keep all follow-up visits as told by your health care provider. This is important. Contact a health care provider if:  You have uncomfortable bladder contractions that you cannot control (spasms) or you leak urine with the spasms.   Get help right away if:  You have chills or fever.  You have blood in your urine.  You have a catheter and: ? Your catheter stops draining urine. ? Your catheter falls out. Summary  Acute urinary retention is a condition in which a person is unable to pass urine. If left untreated, it can result in kidney damage or other serious  complications.  The cause of this condition may include an enlarged prostate. As men age, their prostate gland may become larger and may start pressing or squeezing on the bladder or the urethra.  Treatment for this condition may include medicines and placement of an indwelling urinary catheter.  Monitor any changes in your symptoms. Tell your health care provider about any changes. This information is not intended to replace advice given to you by your health care provider. Make sure you discuss any questions you have with your health care provider. Document Revised: 07/16/2017 Document Reviewed: 09/04/2016 Elsevier Patient Education  2020 [employer]  

## 2020-01-16 ENCOUNTER — Ambulatory Visit: Payer: PPO | Admitting: Gastroenterology

## 2020-01-22 ENCOUNTER — Other Ambulatory Visit: Payer: Self-pay | Admitting: Internal Medicine

## 2020-01-29 ENCOUNTER — Ambulatory Visit: Payer: PPO | Admitting: Gastroenterology

## 2020-02-01 ENCOUNTER — Other Ambulatory Visit: Payer: Self-pay | Admitting: Internal Medicine

## 2020-02-08 ENCOUNTER — Ambulatory Visit: Payer: PPO | Admitting: Internal Medicine

## 2020-02-08 ENCOUNTER — Telehealth: Payer: Self-pay

## 2020-02-08 NOTE — Telephone Encounter (Signed)
Pt called and requested to reschedule endoscopy procedure from 02-14-20 to 03-11-20. Procedure has been rescheduled.

## 2020-02-12 ENCOUNTER — Other Ambulatory Visit: Payer: Self-pay

## 2020-02-12 ENCOUNTER — Encounter: Payer: Self-pay | Admitting: Emergency Medicine

## 2020-02-12 ENCOUNTER — Emergency Department: Payer: PPO

## 2020-02-12 ENCOUNTER — Emergency Department
Admission: EM | Admit: 2020-02-12 | Discharge: 2020-02-12 | Disposition: A | Discharge status: Home / Self Care | Payer: PPO | Attending: Emergency Medicine | Admitting: Emergency Medicine

## 2020-02-12 DIAGNOSIS — S0990XA Unspecified injury of head, initial encounter: Secondary | ICD-10-CM | POA: Diagnosis not present

## 2020-02-12 DIAGNOSIS — J209 Acute bronchitis, unspecified: Secondary | ICD-10-CM | POA: Insufficient documentation

## 2020-02-12 DIAGNOSIS — E119 Type 2 diabetes mellitus without complications: Secondary | ICD-10-CM | POA: Insufficient documentation

## 2020-02-12 DIAGNOSIS — Y929 Unspecified place or not applicable: Secondary | ICD-10-CM | POA: Diagnosis not present

## 2020-02-12 DIAGNOSIS — Z7984 Long term (current) use of oral hypoglycemic drugs: Secondary | ICD-10-CM | POA: Insufficient documentation

## 2020-02-12 DIAGNOSIS — W1839XA Other fall on same level, initial encounter: Secondary | ICD-10-CM | POA: Insufficient documentation

## 2020-02-12 DIAGNOSIS — S0011XA Contusion of right eyelid and periocular area, initial encounter: Secondary | ICD-10-CM | POA: Insufficient documentation

## 2020-02-12 DIAGNOSIS — Y999 Unspecified external cause status: Secondary | ICD-10-CM | POA: Insufficient documentation

## 2020-02-12 DIAGNOSIS — F17210 Nicotine dependence, cigarettes, uncomplicated: Secondary | ICD-10-CM | POA: Diagnosis not present

## 2020-02-12 DIAGNOSIS — Y939 Activity, unspecified: Secondary | ICD-10-CM | POA: Diagnosis not present

## 2020-02-12 DIAGNOSIS — W19XXXA Unspecified fall, initial encounter: Secondary | ICD-10-CM

## 2020-02-12 DIAGNOSIS — J40 Bronchitis, not specified as acute or chronic: Secondary | ICD-10-CM | POA: Diagnosis not present

## 2020-02-12 DIAGNOSIS — R0602 Shortness of breath: Secondary | ICD-10-CM | POA: Diagnosis not present

## 2020-02-12 DIAGNOSIS — S0993XA Unspecified injury of face, initial encounter: Secondary | ICD-10-CM | POA: Diagnosis present

## 2020-02-12 DIAGNOSIS — I10 Essential (primary) hypertension: Secondary | ICD-10-CM | POA: Diagnosis not present

## 2020-02-12 DIAGNOSIS — R519 Headache, unspecified: Secondary | ICD-10-CM | POA: Diagnosis not present

## 2020-02-12 MED ORDER — PREDNISONE 20 MG PO TABS
40.0000 mg | ORAL_TABLET | Freq: Once | ORAL | Status: AC
  Administered 2020-02-12: 40 mg via ORAL
  Filled 2020-02-12: qty 2

## 2020-02-12 MED ORDER — DOXYCYCLINE HYCLATE 100 MG PO CAPS
100.0000 mg | ORAL_CAPSULE | Freq: Two times a day (BID) | ORAL | 0 refills | Status: AC
Start: 2020-02-12 — End: 2020-02-19

## 2020-02-12 MED ORDER — ALBUTEROL SULFATE HFA 108 (90 BASE) MCG/ACT IN AERS
2.0000 | INHALATION_SPRAY | RESPIRATORY_TRACT | 0 refills | Status: DC | PRN
Start: 2020-02-12 — End: 2020-05-02

## 2020-02-12 MED ORDER — PREDNISONE 20 MG PO TABS
40.0000 mg | ORAL_TABLET | Freq: Every day | ORAL | 0 refills | Status: AC
Start: 2020-02-12 — End: 2020-02-17

## 2020-02-12 MED ORDER — AZITHROMYCIN 250 MG PO TABS: ORAL_TABLET | ORAL | 0 refills | Status: DC

## 2020-02-12 MED ORDER — IPRATROPIUM-ALBUTEROL 0.5-2.5 (3) MG/3ML IN SOLN
3.0000 mL | Freq: Once | RESPIRATORY_TRACT | Status: AC
  Administered 2020-02-12: 3 mL via RESPIRATORY_TRACT
  Filled 2020-02-12: qty 3

## 2020-02-12 NOTE — ED Provider Notes (Signed)
[facility] Emergency Department Provider Note  ____________________________________________   First MD Initiated Contact with Patient 02/12/20 1631     (approximate)  I have reviewed the triage vital signs and the nursing notes.   HISTORY  Chief Complaint Fall    HPI Derrick Jones is a 74 y.o. male  With h/o HTN, DM, GERD, here with headache, fatigue. PT reports that two weeks ago, he relapsed drinking. He was standing up last weekend to go to the bathroom when he blacked out, causing him to fall and hit his head. Since then, he's had persistent aching, throbbing headache along with fatigue. He has felt more tired than usual. His headache has been moderate, diffuse. No alleviating factors. No CP, SOB. No other injuries. No fever, chills. He has a chronic cough that is maybe "slightly worse," though denies any increased sputum production. He is here because he is concerned about hsi head only. No neck pain, UE numbness or weakness.        Past Medical History:  Diagnosis Date  . Diabetes mellitus without complication ([place])   . GERD (gastroesophageal reflux disease)   . Hypertension     Patient Active Problem List   Diagnosis Date Noted  . Alcoholic ketoacidosis 40/03/6760  . History of alcohol abuse 08/02/2017  . Hypercholesterolemia 08/02/2017  . Heartburn 07/02/2017  . Prediabetes 05/27/2017  . Adjustment disorder with mixed anxiety and depressed mood 05/11/2017  . Anxiety 05/11/2017  . Hypertension 05/11/2017  . Insomnia 05/11/2017  . History of gastric ulcer 05/11/2017  . History of esophageal stricture 05/11/2017    Past Surgical History:  Procedure Laterality Date  . COLONOSCOPY WITH PROPOFOL N/A 07/21/2019   Procedure: COLONOSCOPY WITH PROPOFOL;  Surgeon: Jonathon Bellows, MD;  Location: [facility] ENDOSCOPY;  Service: Gastroenterology;  Laterality: N/A;  . EYE SURGERY    . FEMUR FRACTURE SURGERY Left     Prior to Admission medications     Medication Sig Start Date End Date Taking? Authorizing Provider  albuterol (VENTOLIN HFA) 108 (90 Base) MCG/ACT inhaler INHALE 1 PUFF BY MOUTH THREE TIMES DAILY AS NEEDED 01/22/20   Cletis Athens, MD  albuterol (VENTOLIN HFA) 108 (90 Base) MCG/ACT inhaler Inhale 2 puffs into the lungs every 4 (four) hours as needed for wheezing or shortness of breath. 02/12/20   Duffy Bruce, MD  atenolol-chlorthalidone (TENORETIC) 100-25 MG tablet Take 1 tablet by mouth once daily 02/02/20   Cletis Athens, MD  atorvastatin (LIPITOR) 20 MG tablet Take 20 mg by mouth daily. 11/14/19   [provider]  azithromycin (ZITHROMAX Z-PAK) 250 MG tablet Take 2 tablets (500 mg) on  Day 1,  followed by 1 tablet (250 mg) once daily on Days 2 through 5. 02/12/20   Duffy Bruce, MD  metFORMIN (GLUCOPHAGE) 500 MG tablet Take 1 tablet (500 mg total) by mouth daily. 01/03/20   Cletis Athens, MD  omeprazole (PRILOSEC) 20 MG capsule Take 1 capsule (20 mg total) by mouth daily. 12/18/19   Cletis Athens, MD  predniSONE (DELTASONE) 20 MG tablet Take 2 tablets (40 mg total) by mouth daily for 5 days. 02/12/20 02/17/20  Duffy Bruce, MD    Allergies Patient has no known allergies.  Family History  Problem Relation Age of Onset  . Breast cancer Sister   . Ovarian cancer Sister     Social History Social History   Tobacco Use  . Smoking status: Current Every Day Smoker    Packs/day: 1.00    Years:  53.00    Pack years: 53.00    Types: Cigarettes  . Smokeless tobacco: Never Used  Vaping Use  . Vaping Use: Never used  Substance Use Topics  . Alcohol use: Yes  . Drug use: No    Review of Systems  Review of Systems  Constitutional: Positive for fatigue. Negative for chills and fever.  HENT: Negative for sore throat.   Respiratory: Positive for cough. Negative for shortness of breath.   Cardiovascular: Negative for chest pain.  Gastrointestinal: Negative for abdominal pain.  Genitourinary: Negative for flank pain.   Musculoskeletal: Negative for neck pain.  Skin: Negative for rash and wound.  Allergic/Immunologic: Negative for immunocompromised state.  Neurological: Positive for weakness and headaches. Negative for numbness.  Hematological: Does not bruise/bleed easily.  All other systems reviewed and are negative.    ____________________________________________  PHYSICAL EXAM:      VITAL SIGNS: ED Triage Vitals  Enc Vitals Group     BP 02/12/20 1242 (!) 149/105     Pulse Rate 02/12/20 1242 62     Resp 02/12/20 1242 16     Temp 02/12/20 1242 97.9 F (36.6 C)     Temp Source 02/12/20 1242 Oral     SpO2 02/12/20 1242 99 %     Weight 02/12/20 1243 155 lb (70.3 kg)     Height 02/12/20 1243 5\' 9"  (1.753 m)     Head Circumference --      Peak Flow --      Pain Score 02/12/20 1249 8     Pain Loc --      Pain Edu? --      Excl. in [place]? --      Physical Exam Vitals and nursing note reviewed.  Constitutional:      General: He is not in acute distress.    Appearance: He is well-developed.  HENT:     Head: Normocephalic and atraumatic.     Comments: Healing bruising under right eyelid. No postauricular ecchymoses or hemotympanum. No hematoma or swelling. Eyes:     Conjunctiva/sclera: Conjunctivae normal.  Cardiovascular:     Rate and Rhythm: Normal rate and regular rhythm.     Heart sounds: Normal heart sounds.  Pulmonary:     Effort: Pulmonary effort is normal. No respiratory distress.     Breath sounds: No wheezing.     Comments: Slight diffuse external wheezing, normal WOB.  Abdominal:     General: There is no distension.  Musculoskeletal:     Cervical back: Neck supple.  Skin:    General: Skin is warm.     Capillary Refill: Capillary refill takes less than 2 seconds.     Findings: No rash.  Neurological:     Mental Status: He is alert and oriented to person, place, and time.     Motor: No abnormal muscle tone.     Comments: CN II - XII intact. Normal strength and sensation  b/l UE and LE. Normal gait.  Psychiatric:     Comments: Anxious       ____________________________________________   LABS (all labs ordered are listed, but only abnormal results are displayed)  Labs Reviewed - No data to display  ____________________________________________  EKG: Normal sinus rhythm, VR 61. PR 128, QRS 88, Qtc 412. Slight peakign of TW unchanged form prior, doubt acute TW or HyperK. Normal intervals. ________________________________________  RADIOLOGY All imaging, including plain films, CT scans, and ultrasounds, independently reviewed by me, and interpretations confirmed via formal radiology  reads.  ED MD interpretation:   CT Head: [place]  Official radiology report(s): CT Head Wo Contrast  Result Date: 02/12/2020 CLINICAL DATA:  Headache and blurry vision since a fall 1 week ago. EXAM: CT HEAD WITHOUT CONTRAST TECHNIQUE: Contiguous axial images were obtained from the base of the skull through the vertex without intravenous contrast. COMPARISON:  CT angiogram of the head 05/09/2019. FINDINGS: Brain: No evidence of acute infarction, hemorrhage, hydrocephalus, extra-axial collection or mass lesion/mass effect. Atrophy and chronic microvascular ischemic change again seen Vascular: Atherosclerosis. Skull: Intact.  No focal lesion. Sinuses/Orbits: Negative. Other: None. IMPRESSION: No acute abnormality. Atrophy and chronic microvascular ischemic change. Atherosclerosis. Electronically Signed   By: Inge Rise M.D.   On: 02/12/2020 13:28   DG Chest Portable 1 View  Result Date: 02/12/2020 CLINICAL DATA:  Shortness of breath status post fall EXAM: PORTABLE CHEST 1 VIEW COMPARISON:  June 30, 2012. FINDINGS: On only a single view, there is suggestion of a nodular density overlying the right upper lung zone at the level of the first rib space. This is less well appreciated on the additional frontal view of the chest. The lungs are otherwise clear. There is no evidence  for pneumothorax. Heart size is normal. Aortic calcifications are noted. There is no definite evidence for a displaced rib fracture. IMPRESSION: 1. No acute cardiopulmonary process. 2. Nodular density overlying the right upper lung zone. A follow-up two-view chest x-ray is recommended in 4-6 weeks to confirm stability or resolution of this finding. Electronically Signed   By: Constance Holster M.D.   On: 02/12/2020 18:18    ____________________________________________  PROCEDURES   Procedure(s) performed (including Critical Care):  Procedures  ____________________________________________  INITIAL IMPRESSION / MDM / [place] / ED COURSE  As part of my medical decision making, I reviewed the following data within the [facility] notes reviewed and incorporated, Old chart reviewed, Notes from prior ED visits, and  Controlled Substance Database       *Derrick Jones was evaluated in Emergency Department on 02/12/2020 for the symptoms described in the history of present illness. He was evaluated in the context of the global COVID-19 pandemic, which necessitated consideration that the patient might be at risk for infection with the SARS-CoV-2 virus that causes COVID-19. Institutional protocols and algorithms that pertain to the evaluation of patients at risk for COVID-19 are in a state of rapid change based on information released by regulatory bodies including the CDC and federal and state organizations. These policies and algorithms were followed during the patient's care in the ED.  Some ED evaluations and interventions may be delayed as a result of limited staffing during the pandemic.*     Medical Decision Making:  74 yo M here with fall, headache, and mild fatigue. Re: fall - this occurred while intoxicated. He has no focal neuro deficits. No neck pain, stiffness, or signs to suggest C-Spine injury or occult cord injury. CT head shows NAICA. He is not on  blood thinners. Likely concussion/mild TBI. Re: weakness, likely 2/2 his recent EtOH binging. He doe shave some mild wheezing and cough but has a long h/o smoking/COPD and is adamant he is not SOB. CXR shows nodular density in RUL zone - recommended follow-up. He is very anxious to leave the ED and does not wish to stay for additional imaging or labs.  Will encourage supportive care for his TBI, and tx with prednisone/nebs/ABX for his XR findings with PCP f/u. Encouraged  hydration and good return precautions.  ____________________________________________  FINAL CLINICAL IMPRESSION(S) / ED DIAGNOSES  Final diagnoses:  Fall, initial encounter  Injury of head, initial encounter  Bronchitis     MEDICATIONS GIVEN DURING THIS VISIT:  Medications  ipratropium-albuterol (DUONEB) 0.5-2.5 (3) MG/3ML nebulizer solution 3 mL (3 mLs Nebulization Given 02/12/20 1740)  predniSONE (DELTASONE) tablet 40 mg (40 mg Oral Given 02/12/20 1740)     ED Discharge Orders         Ordered    predniSONE (DELTASONE) 20 MG tablet  Daily     Discontinue  Reprint     02/12/20 1836    azithromycin (ZITHROMAX Z-PAK) 250 MG tablet     Discontinue  Reprint     02/12/20 1836    albuterol (VENTOLIN HFA) 108 (90 Base) MCG/ACT inhaler  Every 4 hours PRN     Discontinue  Reprint     02/12/20 1836           Note:  This document was prepared using Dragon voice recognition software and may include unintentional dictation errors.   Duffy Bruce, MD 02/12/20 2046

## 2020-02-12 NOTE — Discharge Instructions (Signed)
For your weakness/fatigue: Take the prednisone as prescribed, ideally with food in the mornings USE YOUR INHALER every 4 hours while awake DRINK at least 6-8 glasses of water daily  For your headache: Take Tylenol as needed Follow-up with your doctor

## 2020-02-12 NOTE — ED Triage Notes (Addendum)
Patient presents to the ED after a fall last Monday.  Patient reports severe headaches and blurry vision since the fall.  Patient states he lost consciousness from the fall.  Patient denies any falls since that time.  Denies neck pain

## 2020-02-12 NOTE — ED Notes (Signed)
NAD noted at time of D/C. Pt taken to lobby via wheelchair by daughter. Pt and daughter deny comments/concerns regarding D/C instructions. Verbal consent for D/C obtained due to patient's anxiety regarding D/C.

## 2020-02-14 ENCOUNTER — Ambulatory Visit: Payer: PPO | Admitting: Gastroenterology

## 2020-02-22 ENCOUNTER — Ambulatory Visit: Payer: PPO | Admitting: Internal Medicine

## 2020-02-22 ENCOUNTER — Other Ambulatory Visit: Payer: Self-pay

## 2020-02-27 ENCOUNTER — Other Ambulatory Visit: Payer: Self-pay

## 2020-02-27 ENCOUNTER — Ambulatory Visit (INDEPENDENT_AMBULATORY_CARE_PROVIDER_SITE_OTHER): Payer: PPO | Admitting: Internal Medicine

## 2020-02-27 ENCOUNTER — Encounter: Payer: Self-pay | Admitting: Internal Medicine

## 2020-02-27 VITALS — BP 159/89 | HR 78 | Ht 67.0 in | Wt 123.8 lb

## 2020-02-27 DIAGNOSIS — I10 Essential (primary) hypertension: Secondary | ICD-10-CM

## 2020-02-27 DIAGNOSIS — F17200 Nicotine dependence, unspecified, uncomplicated: Secondary | ICD-10-CM

## 2020-02-27 DIAGNOSIS — G4700 Insomnia, unspecified: Secondary | ICD-10-CM

## 2020-02-27 DIAGNOSIS — S060X1S Concussion with loss of consciousness of 30 minutes or less, sequela: Secondary | ICD-10-CM

## 2020-02-27 DIAGNOSIS — F1010 Alcohol abuse, uncomplicated: Secondary | ICD-10-CM | POA: Insufficient documentation

## 2020-02-27 DIAGNOSIS — S060X1A Concussion with loss of consciousness of 30 minutes or less, initial encounter: Secondary | ICD-10-CM | POA: Insufficient documentation

## 2020-02-27 HISTORY — DX: Alcohol abuse, uncomplicated: F10.10

## 2020-02-27 NOTE — Assessment & Plan Note (Signed)
Complain of insomnia wants some medicine for that, I told him that  It is due to alcohol withdrawal  he was advised to stop drinking completely.  He does not have any focal neurological sign neck is supple tendon reflexes are normal.

## 2020-02-27 NOTE — Progress Notes (Signed)
Patient ID: Derrick Jones, male   DOB: 03-Nov-1945, 74 y.o.   MRN: [id number]    Established Patient Office Visit  Subjective:  Patient ID: Derrick Jones, male    DOB: June 17, 1946  Age: 74 y.o. MRN: [id number]  CC:  Chief Complaint  Patient presents with  . Follow-up    patient had recent fall after drinking alcohol, patient fell and hit head on floor. Patient went to ER and they did CT     HPI  Derrick Jones presents for a follow up after he fall and hit the right side of his head on the floor. He has been drinking a lot recently but his fall made him want to stop drinking. He went to the ER and had a CT which revealed no acute abnormality. He has been drinking Ensure/Boost. He uses an inhaler once daily. He notes that he struggles to sleep through the night.  The patient smokes 10-12 cigarettes daily. He has been retired for about 12 years.  Past Medical History:  Diagnosis Date  . Diabetes mellitus without complication ([place])   . GERD (gastroesophageal reflux disease)   . Hypertension     Past Surgical History:  Procedure Laterality Date  . COLONOSCOPY WITH PROPOFOL N/A 07/21/2019   Procedure: COLONOSCOPY WITH PROPOFOL;  Surgeon: Jonathon Bellows, MD;  Location: [facility] ENDOSCOPY;  Service: Gastroenterology;  Laterality: N/A;  . EYE SURGERY    . FEMUR FRACTURE SURGERY Left     Family History  Problem Relation Age of Onset  . Breast cancer Sister   . Ovarian cancer Sister     Social History   Socioeconomic History  . Marital status: Married    Spouse name: Not on file  . Number of children: Not on file  . Years of education: Not on file  . Highest education level: Not on file  Occupational History  . Not on file  Tobacco Use  . Smoking status: Current Every Day Smoker    Packs/day: 1.00    Years: 53.00    Pack years: 53.00    Types: Cigarettes  . Smokeless tobacco: Never Used  Vaping Use  . Vaping Use: Never used  Substance and Sexual Activity  . Alcohol use: Yes  . Drug  use: No  . Sexual activity: Not on file  Other Topics Concern  . Not on file  Social History Narrative  . Not on file   Social Determinants of Health   Financial Resource Strain:   . Difficulty of Paying Living Expenses:   Food Insecurity:   . Worried About Charity fundraiser in the Last Year:   . Arboriculturist in the Last Year:   Transportation Needs:   . Film/video editor (Medical):   [name] Lack of Transportation (Non-Medical):   Physical Activity:   . Days of Exercise per Week:   . Minutes of Exercise per Session:   Stress:   . Feeling of Stress :   Social Connections:   . Frequency of Communication with Friends and Family:   . Frequency of Social Gatherings with Friends and Family:   . Attends Religious Services:   . Active Member of Clubs or Organizations:   . Attends Archivist Meetings:   [name] Marital Status:   Intimate Partner Violence:   . Fear of Current or Ex-Partner:   . Emotionally Abused:   [name] Physically Abused:   . Sexually Abused:      Current Outpatient Medications:  .  albuterol (VENTOLIN HFA) 108 (90 Base) MCG/ACT inhaler, Inhale 2 puffs into the lungs every 4 (four) hours as needed for wheezing or shortness of breath., Disp: 6.7 g, Rfl: 0 .  atenolol-chlorthalidone (TENORETIC) 100-25 MG tablet, Take 1 tablet by mouth once daily, Disp: 30 tablet, Rfl: 0 .  atorvastatin (LIPITOR) 20 MG tablet, Take 20 mg by mouth daily., Disp: , Rfl:  .  metFORMIN (GLUCOPHAGE) 500 MG tablet, Take 1 tablet (500 mg total) by mouth daily., Disp: 30 tablet, Rfl: 0 .  omeprazole (PRILOSEC) 20 MG capsule, Take 1 capsule (20 mg total) by mouth daily., Disp: 90 capsule, Rfl: 1   No Known Allergies  ROS Review of Systems  Constitutional: Negative.   HENT: Negative.   Eyes: Negative.   Respiratory: Negative.   Cardiovascular: Negative.   Gastrointestinal: Negative.   Endocrine: Negative.   Genitourinary: Negative.   Musculoskeletal:       Recent fall.  Skin:  Negative.   Allergic/Immunologic: Negative.   Neurological: Negative.   Hematological: Negative.   Psychiatric/Behavioral: Positive for sleep disturbance (cannot sleep through night).  All other systems reviewed and are negative.     Objective:    Physical Exam Constitutional:      General: He is not in acute distress.    Appearance: Normal appearance. He is well-developed.     Interventions: Face mask in place.  HENT:     Head: Normocephalic and atraumatic.     Right Ear: Hearing normal.     Left Ear: Hearing normal.     Mouth/Throat:     Mouth: No oral lesions.  Eyes:     Conjunctiva/sclera: Conjunctivae normal.     Pupils: Pupils are equal, round, and reactive to light.  Cardiovascular:     Rate and Rhythm: Normal rate and regular rhythm.     Heart sounds: Normal heart sounds. No murmur heard.  No friction rub. No gallop.   Pulmonary:     Effort: Pulmonary effort is normal.     Breath sounds: Rhonchi (B/L) present. No wheezing or rales.  Abdominal:     General: Bowel sounds are normal.     Palpations: Abdomen is soft. There is no mass.     Tenderness: There is no abdominal tenderness.  Musculoskeletal:        General: No tenderness. Normal range of motion.     Cervical back: Normal range of motion and neck supple.  Lymphadenopathy:     Cervical: No cervical adenopathy.     Right cervical: No superficial cervical adenopathy. Skin:    General: Skin is warm and dry.     Findings: No bruising, erythema, lesion or rash.  Neurological:     Mental Status: He is alert and oriented to person, place, and time.  Psychiatric:        Behavior: Behavior normal.        Thought Content: Thought content normal.        Judgment: Judgment normal.     BP (!) 159/89   Pulse 78   Ht 5\' 7"  (1.702 m)   Wt 123 lb 12.8 oz (56.2 kg)   BMI 19.39 kg/m  Wt Readings from Last 3 Encounters:  02/27/20 123 lb 12.8 oz (56.2 kg)  02/12/20 155 lb (70.3 kg)  01/04/20 150 lb (68 kg)      Health Maintenance Due  Topic Date Due  . Hepatitis C Screening  Never done  . COVID-19 Vaccine (1) Never done  . TETANUS/TDAP  Never done  . PNA vac Low Risk Adult (1 of 2 - PCV13) Never done    There are no preventive care reminders to display for this patient.  No results found for: TSH Lab Results  Component Value Date   WBC 10.5 12/29/2019   HGB 12.6 (L) 12/29/2019   HCT 36.7 (L) 12/29/2019   MCV 91.3 12/29/2019   PLT 238 12/29/2019   Lab Results  Component Value Date   NA 126 (L) 12/29/2019   K 3.8 12/29/2019   CO2 28 12/29/2019   GLUCOSE 129 (H) 12/29/2019   BUN 12 12/29/2019   CREATININE 0.86 12/29/2019   BILITOT 0.9 12/26/2019   ALKPHOS 46 12/26/2019   AST 37 12/26/2019   ALT 17 12/26/2019   PROT 7.3 12/26/2019   ALBUMIN 4.2 12/26/2019   CALCIUM 9.0 12/29/2019   ANIONGAP 12 12/29/2019   Lab Results  Component Value Date   CHOL 227 (H) 07/02/2017   Lab Results  Component Value Date   HDL 45 07/02/2017   Lab Results  Component Value Date   LDLCALC 145 (H) 07/02/2017   Lab Results  Component Value Date   TRIG 229 (H) 07/02/2017   Lab Results  Component Value Date   CHOLHDL 5.0 (H) 07/02/2017   Lab Results  Component Value Date   HGBA1C 5.6 05/09/2019      Assessment & Plan:   Problem List Items Addressed This Visit      Cardiovascular and Mediastinum   Hypertension    Blood pressure is slightly high today but still stable.  Return back in my office recheck  the blood pressure        Nervous and Auditory   Concussion wth loss of consciousness of 30 minutes or less - Primary    Patient passed out after drinking beer.  unknown quantity.  Patient emergency CT scan did not show any bleed.  He was sent to me for a follow-up as an outpatient basis i did not find any focal neurological sign to suggest subdural hematoma.  Was advised to come back and see me in a week        Other   Insomnia    Complain of insomnia wants some  medicine for that, I told him that  It is due to alcohol withdrawal  he was advised to stop drinking completely.  He does not have any focal neurological sign neck is supple tendon reflexes are normal.      Alcohol abuse    Patient was advised to stop drinking completely.         No orders of the defined types were placed in this encounter.   Follow-up: Return in about 1 week (around 03/05/2020).   By signing my name below, I, De Burrs, attest that this documentation has been prepared under the direction and in the presence of Cletis Athens, MD. Electronically Signed: De Burrs, Medical Scribe. 02/27/20. 6:41 PM. I personally performed the services described in this documentation, which was SCRIBED in my presence. The recorded information has been reviewed and considered accurate. It has been edited as necessary during review. Cletis Athens, MD

## 2020-02-27 NOTE — Assessment & Plan Note (Signed)
Blood pressure is slightly high today but still stable.  Return back in my office recheck  the blood pressure

## 2020-02-27 NOTE — Assessment & Plan Note (Signed)
Patient passed out after drinking beer.  unknown quantity.  Patient emergency CT scan did not show any bleed.  He was sent to me for a follow-up as an outpatient basis i did not find any focal neurological sign to suggest subdural hematoma.  Was advised to come back and see me in a week

## 2020-02-27 NOTE — Assessment & Plan Note (Signed)
Patient was advised to stop drinking completely. 

## 2020-03-04 ENCOUNTER — Other Ambulatory Visit: Payer: Self-pay

## 2020-03-04 ENCOUNTER — Encounter: Payer: Self-pay | Admitting: Internal Medicine

## 2020-03-04 ENCOUNTER — Ambulatory Visit (INDEPENDENT_AMBULATORY_CARE_PROVIDER_SITE_OTHER): Payer: PPO | Admitting: Internal Medicine

## 2020-03-04 VITALS — BP 150/84 | HR 72 | Ht 68.0 in | Wt 121.5 lb

## 2020-03-04 DIAGNOSIS — F4323 Adjustment disorder with mixed anxiety and depressed mood: Secondary | ICD-10-CM | POA: Diagnosis not present

## 2020-03-04 DIAGNOSIS — I10 Essential (primary) hypertension: Secondary | ICD-10-CM | POA: Diagnosis not present

## 2020-03-04 DIAGNOSIS — S060X1S Concussion with loss of consciousness of 30 minutes or less, sequela: Secondary | ICD-10-CM | POA: Diagnosis not present

## 2020-03-04 DIAGNOSIS — F1011 Alcohol abuse, in remission: Secondary | ICD-10-CM | POA: Diagnosis not present

## 2020-03-04 NOTE — Progress Notes (Addendum)
Established Patient Office Visit  SUBJECTIVE:  Subjective  Patient ID: Derrick Jones, male    DOB: 06-Feb-1946  Age: 74 y.o. MRN: [id number]  CC:  Chief Complaint  Patient presents with  . Concussion    HPI Derrick Jones is a 74 y.o. male presenting today for a follow up to his recent concussion.   He has been doing well since his fall. He denies any additional recent falls. He has had to increase his inhaler usage from 1x daily to 3x daily likely due to environmental allergies in combination with his smoking habits.   He does need a refill for his metformin as well.   He continues to smoke around 10-12 cigarettes daily. He does acknowledge that he needs to quit, but that it is difficult for him to do so at this time.   Past Medical History:  Diagnosis Date  . Diabetes mellitus without complication ([place])   . GERD (gastroesophageal reflux disease)   . Hypertension     Past Surgical History:  Procedure Laterality Date  . COLONOSCOPY WITH PROPOFOL N/A 07/21/2019   Procedure: COLONOSCOPY WITH PROPOFOL;  Surgeon: Jonathon Bellows, MD;  Location: [facility] ENDOSCOPY;  Service: Gastroenterology;  Laterality: N/A;  . EYE SURGERY    . FEMUR FRACTURE SURGERY Left     Family History  Problem Relation Age of Onset  . Breast cancer Sister   . Ovarian cancer Sister     Social History   Socioeconomic History  . Marital status: Married    Spouse name: Not on file  . Number of children: Not on file  . Years of education: Not on file  . Highest education level: Not on file  Occupational History  . Not on file  Tobacco Use  . Smoking status: Current Every Day Smoker    Packs/day: 1.00    Years: 53.00    Pack years: 53.00    Types: Cigarettes  . Smokeless tobacco: Never Used  Vaping Use  . Vaping Use: Never used  Substance and Sexual Activity  . Alcohol use: Yes  . Drug use: No  . Sexual activity: Not on file  Other Topics Concern  . Not on file  Social History Narrative  . Not on  file   Social Determinants of Health   Financial Resource Strain:   . Difficulty of Paying Living Expenses:   Food Insecurity:   . Worried About Charity fundraiser in the Last Year:   . Arboriculturist in the Last Year:   Transportation Needs:   . Film/video editor (Medical):   [name] Lack of Transportation (Non-Medical):   Physical Activity:   . Days of Exercise per Week:   . Minutes of Exercise per Session:   Stress:   . Feeling of Stress :   Social Connections:   . Frequency of Communication with Friends and Family:   . Frequency of Social Gatherings with Friends and Family:   . Attends Religious Services:   . Active Member of Clubs or Organizations:   . Attends Archivist Meetings:   [name] Marital Status:   Intimate Partner Violence:   . Fear of Current or Ex-Partner:   . Emotionally Abused:   [name] Physically Abused:   . Sexually Abused:      Current Outpatient Medications:  .  albuterol (VENTOLIN HFA) 108 (90 Base) MCG/ACT inhaler, Inhale 2 puffs into the lungs every 4 (four) hours as needed for wheezing or shortness of  breath., Disp: 6.7 g, Rfl: 0 .  atenolol-chlorthalidone (TENORETIC) 100-25 MG tablet, Take 1 tablet by mouth once daily, Disp: 30 tablet, Rfl: 0 .  atorvastatin (LIPITOR) 20 MG tablet, Take 20 mg by mouth daily., Disp: , Rfl:  .  metFORMIN (GLUCOPHAGE) 500 MG tablet, Take 1 tablet (500 mg total) by mouth daily., Disp: 30 tablet, Rfl: 0 .  omeprazole (PRILOSEC) 20 MG capsule, Take 1 capsule (20 mg total) by mouth daily., Disp: 90 capsule, Rfl: 1   No Known Allergies  ROS Review of Systems  Constitutional: Negative.   HENT: Negative.   Eyes: Negative.   Respiratory: Positive for shortness of breath.   Cardiovascular: Negative.   Gastrointestinal: Negative.   Endocrine: Negative.   Genitourinary: Negative.   Musculoskeletal: Negative.   Skin: Negative.   Allergic/Immunologic: Negative.   Neurological: Negative.   Hematological: Negative.     Psychiatric/Behavioral: Negative.   All other systems reviewed and are negative.    OBJECTIVE:    Physical Exam Vitals reviewed.  Constitutional:      Appearance: Normal appearance.  Neck:     Vascular: No carotid bruit.  Cardiovascular:     Rate and Rhythm: Normal rate and regular rhythm.     Pulses: Normal pulses.     Heart sounds: Normal heart sounds.  Pulmonary:     Effort: Pulmonary effort is normal.     Breath sounds: Normal breath sounds.  Abdominal:     General: Bowel sounds are normal.     Palpations: Abdomen is soft. There is no hepatomegaly or splenomegaly.     Tenderness: There is no abdominal tenderness.     Hernia: No hernia is present.  Musculoskeletal:     Right lower leg: No edema.     Left lower leg: No edema.  Skin:    Findings: No rash.  Neurological:     Mental Status: He is alert and oriented to person, place, and time.     Motor: Motor function is intact.     Coordination: Coordination is intact.     Gait: Gait is intact.  Psychiatric:        Mood and Affect: Mood normal.        Behavior: Behavior normal.     BP (!) 150/84   Pulse 72   Ht 5\' 8"  (1.727 m)   Wt 121 lb 8 oz (55.1 kg)   BMI 18.47 kg/m  Wt Readings from Last 3 Encounters:  03/04/20 121 lb 8 oz (55.1 kg)  02/27/20 123 lb 12.8 oz (56.2 kg)  02/12/20 155 lb (70.3 kg)    Health Maintenance Due  Topic Date Due  . Hepatitis C Screening  Never done  . COVID-19 Vaccine (1) Never done  . TETANUS/TDAP  Never done  . PNA vac Low Risk Adult (1 of 2 - PCV13) Never done    There are no preventive care reminders to display for this patient.  CBC Latest Ref Rng & Units 12/29/2019 12/26/2019 05/09/2019  WBC 4.0 - 10.5 K/uL 10.5 14.8(H) 8.3  Hemoglobin 13.0 - 17.0 g/dL 12.6(L) 13.8 12.5(L)  Hematocrit 39 - 52 % 36.7(L) 38.6(L) 35.6(L)  Platelets 150 - 400 K/uL 238 198 201   CMP Latest Ref Rng & Units 12/29/2019 12/26/2019 05/10/2019  Glucose 70 - 99 mg/dL 129(H) 196(H) 59(L)  BUN 8 -  23 mg/dL 12 16 <5(L)  Creatinine 0.61 - 1.24 mg/dL 0.86 0.76 0.66  Sodium 135 - 145 mmol/L 126(L) 124(L) 135  Potassium  3.5 - 5.1 mmol/L 3.8 3.8 4.8  Chloride 98 - 111 mmol/L 86(L) 85(L) 102  CO2 22 - 32 mmol/L 28 24 26   Calcium 8.9 - 10.3 mg/dL 9.0 8.8(L) 8.5(L)  Total Protein 6.5 - 8.1 g/dL - 7.3 -  Total Bilirubin 0.3 - 1.2 mg/dL - 0.9 -  Alkaline Phos 38 - 126 U/L - 46 -  AST 15 - 41 U/L - 37 -  ALT 0 - 44 U/L - 17 -    No results found for: TSH Lab Results  Component Value Date   ALBUMIN 4.2 12/26/2019   ANIONGAP 12 12/29/2019   Lab Results  Component Value Date   CHOL 227 (H) 07/02/2017   CHOL 260 (A) 10/20/2016   HDL 45 07/02/2017   HDL 46 10/20/2016   LDLCALC 145 (H) 07/02/2017   LDLCALC 157 10/20/2016   CHOLHDL 5.0 (H) 07/02/2017   Lab Results  Component Value Date   TRIG 229 (H) 07/02/2017   Lab Results  Component Value Date   HGBA1C 5.6 05/09/2019   HGBA1C 6.1 10/20/2016      ASSESSMENT & PLAN:   Problem List Items Addressed This Visit      Cardiovascular and Mediastinum   Hypertension - Primary    - Today, the patient's blood pressure is well managed on atenolol. - The patient will continue the current treatment regimen.  - I encouraged the patient to eat a low-sodium diet to help control blood pressure. - I encouraged the patient to live an active lifestyle and complete activities that increases heart rate to 85% target heart rate at least 5 times per week for one hour.            Nervous and Auditory   Concussion wth loss of consciousness of 30 minutes or less    Neurological examination is normal with out any focal neurological sign.  Gait is not ataxic Romberg is negative         Other   Adjustment disorder with mixed anxiety and depressed mood    - Patient experiencing high levels of anxiety.  - Encouraged patient to engage in relaxing activities like yoga, meditation, journaling, going for a walk, or participating in a hobby.  -  Encouraged patient to reach out to trusted friends or family members about recent struggles       History of alcohol abuse    Not drinking any alcohol anymore         No orders of the defined types were placed in this encounter.  - I discussed the patient's labs.  - The patient verbalized their understanding of the results.   Follow-up: Return in about 4 weeks (around 04/01/2020).    Dr. [facility] [address], [place], [place] [address]   By signing my name below,  attest that this documentation has been prepared under the direction and in the presence of Cletis Athens, MD. Electronically Signed: Cletis Athens, MD 03/04/20, 6:26 PM   I personally performed the services described in this documentation, which was SCRIBED in my presence. The recorded information has been reviewed and considered accurate. It has been edited as necessary during review. Cletis Athens, MD

## 2020-03-04 NOTE — Assessment & Plan Note (Signed)
-   Patient experiencing high levels of anxiety.  - Encouraged patient to engage in relaxing activities like yoga, meditation, journaling, going for a walk, or participating in a hobby.  - Encouraged patient to reach out to trusted friends or family members about recent struggles 

## 2020-03-04 NOTE — Assessment & Plan Note (Signed)
Not drinking any alcohol anymore

## 2020-03-04 NOTE — Assessment & Plan Note (Signed)
Neurological examination is normal with out any focal neurological sign.  Gait is not ataxic Romberg is negative

## 2020-03-04 NOTE — Assessment & Plan Note (Signed)
-   Today, the patient's blood pressure is well managed on atenolol - The patient will continue the current treatment regimen.  - I encouraged the patient to eat a low-sodium diet to help control blood pressure. - I encouraged the patient to live an active lifestyle and complete activities that increases heart rate to 85% target heart rate at least 5 times per week for one hour.    

## 2020-03-07 ENCOUNTER — Other Ambulatory Visit
Admission: RE | Admit: 2020-03-07 | Discharge: 2020-03-07 | Disposition: A | Discharge status: Home / Self Care | Payer: PPO | Source: Ambulatory Visit | Attending: Gastroenterology | Admitting: Gastroenterology

## 2020-03-07 ENCOUNTER — Other Ambulatory Visit: Payer: Self-pay

## 2020-03-07 DIAGNOSIS — Z01812 Encounter for preprocedural laboratory examination: Secondary | ICD-10-CM | POA: Insufficient documentation

## 2020-03-07 DIAGNOSIS — Z20822 Contact with and (suspected) exposure to covid-19: Secondary | ICD-10-CM | POA: Diagnosis not present

## 2020-03-07 LAB — SARS CORONAVIRUS 2 (TAT 6-24 HRS): SARS Coronavirus 2: NEGATIVE

## 2020-03-11 ENCOUNTER — Ambulatory Visit: Payer: PPO | Admitting: Certified Registered Nurse Anesthetist

## 2020-03-11 ENCOUNTER — Encounter: Payer: Self-pay | Admitting: Gastroenterology

## 2020-03-11 ENCOUNTER — Ambulatory Visit
Admission: RE | Admit: 2020-03-11 | Discharge: 2020-03-11 | Disposition: A | Discharge status: Home / Self Care | Payer: PPO | Attending: Gastroenterology | Admitting: Gastroenterology

## 2020-03-11 ENCOUNTER — Encounter
Admission: RE | Disposition: A | Discharge status: Home / Self Care | Payer: Self-pay | Source: Home / Self Care | Attending: Gastroenterology

## 2020-03-11 ENCOUNTER — Other Ambulatory Visit: Payer: Self-pay

## 2020-03-11 DIAGNOSIS — I10 Essential (primary) hypertension: Secondary | ICD-10-CM | POA: Diagnosis not present

## 2020-03-11 DIAGNOSIS — Z79899 Other long term (current) drug therapy: Secondary | ICD-10-CM | POA: Insufficient documentation

## 2020-03-11 DIAGNOSIS — E119 Type 2 diabetes mellitus without complications: Secondary | ICD-10-CM | POA: Diagnosis not present

## 2020-03-11 DIAGNOSIS — K219 Gastro-esophageal reflux disease without esophagitis: Secondary | ICD-10-CM | POA: Diagnosis not present

## 2020-03-11 DIAGNOSIS — F17210 Nicotine dependence, cigarettes, uncomplicated: Secondary | ICD-10-CM | POA: Insufficient documentation

## 2020-03-11 DIAGNOSIS — Z7984 Long term (current) use of oral hypoglycemic drugs: Secondary | ICD-10-CM | POA: Diagnosis not present

## 2020-03-11 DIAGNOSIS — R1310 Dysphagia, unspecified: Secondary | ICD-10-CM

## 2020-03-11 DIAGNOSIS — E7800 Pure hypercholesterolemia, unspecified: Secondary | ICD-10-CM | POA: Diagnosis not present

## 2020-03-11 DIAGNOSIS — K228 Other specified diseases of esophagus: Secondary | ICD-10-CM | POA: Insufficient documentation

## 2020-03-11 DIAGNOSIS — K222 Esophageal obstruction: Secondary | ICD-10-CM | POA: Diagnosis not present

## 2020-03-11 HISTORY — PX: ESOPHAGOGASTRODUODENOSCOPY (EGD) WITH PROPOFOL: SHX5813

## 2020-03-11 LAB — GLUCOSE, CAPILLARY: Glucose-Capillary: 84 mg/dL (ref 70–99)

## 2020-03-11 SURGERY — ESOPHAGOGASTRODUODENOSCOPY (EGD) WITH PROPOFOL
Anesthesia: General

## 2020-03-11 MED ORDER — SODIUM CHLORIDE 0.9 % IV SOLN: INTRAVENOUS | Status: DC

## 2020-03-11 MED ORDER — LIDOCAINE HCL (CARDIAC) PF 100 MG/5ML IV SOSY
PREFILLED_SYRINGE | INTRAVENOUS | Status: DC | PRN
  Administered 2020-03-11: 60 mg via INTRAVENOUS

## 2020-03-11 MED ORDER — PROPOFOL 10 MG/ML IV BOLUS
INTRAVENOUS | Status: AC
  Filled 2020-03-11: qty 80

## 2020-03-11 MED ORDER — LIDOCAINE HCL (PF) 2 % IJ SOLN
INTRAMUSCULAR | Status: AC
  Filled 2020-03-11: qty 5

## 2020-03-11 MED ORDER — PROPOFOL 10 MG/ML IV BOLUS
INTRAVENOUS | Status: DC | PRN
  Administered 2020-03-11: 70 mg via INTRAVENOUS
  Administered 2020-03-11: 20 mg via INTRAVENOUS

## 2020-03-11 NOTE — Op Note (Signed)
[facility] Gastroenterology Patient Name: Derrick Jones Procedure Date: 03/11/2020 7:53 AM MRN: [id number] Account #: [id number] Date of Birth: 1946/02/28 Admit Type: Outpatient Age: 74 Room: [facility] ENDO ROOM 1 Gender: Male Note Status: Finalized Procedure:             Upper GI endoscopy Indications:           Dysphagia Providers:             Jonathon Bellows MD, MD Referring MD:          Cletis Athens, MD (Referring MD) Medicines:             Monitored Anesthesia Care Complications:         No immediate complications. Procedure:             Pre-Anesthesia Assessment:                        - Prior to the procedure, a History and Physical was                         performed, and patient medications, allergies and                         sensitivities were reviewed. The patient's tolerance                         of previous anesthesia was reviewed.                        - The risks and benefits of the procedure and the                         sedation options and risks were discussed with the                         patient. All questions were answered and informed                         consent was obtained.                        - ASA Grade Assessment: II - A patient with mild                         systemic disease.                        After obtaining informed consent, the endoscope was                         passed under direct vision. Throughout the procedure,                         the patient's blood pressure, pulse, and oxygen                         saturations were monitored continuously. The Endoscope                         was introduced through the mouth, and advanced to  the                         third part of duodenum. The upper GI endoscopy was                         accomplished with ease. The patient tolerated the                         procedure well. Findings:      One malignant-appearing, intrinsic moderate (circumferential scarring or        stenosis; an endoscope may pass) stenosis was found at the       gastroesophageal junction. This stenosis measured 1 cm (inner diameter)       x 1 cm (in length). The stenosis was traversed.      Localized moderate mucosal changes characterized by congestion,       discoloration, friability (with contact bleeding), sloughing and altered       texture were found in the lower third of the esophagus and at the       gastroesophageal junction. Biopsies were taken with a cold forceps for       histology.      The cardia and gastric fundus were normal on retroflexion.      The stomach was normal.      The examined duodenum was normal. Impression:            - Malignant-appearing esophageal stenosis.                        - Congested, discolored, friable (with contact                         bleeding), texture changed mucosa in the esophagus.                         Biopsied.                        - Normal stomach.                        - Normal examined duodenum. Recommendation:        - Discharge patient to home (with escort).                        - Resume previous diet.                        - Continue present medications.                        - Await pathology results.                        - Return to my office in 10 days. Procedure Code(s):     --- Professional ---                        [phone], Esophagogastroduodenoscopy, flexible,                         transoral; with biopsy, single or multiple Diagnosis Code(s):     ---  Professional ---                        K22.2, Esophageal obstruction                        K22.8, Other specified diseases of esophagus                        R13.10, Dysphagia, unspecified CPT copyright 2019 American Medical Association. All rights reserved. The codes documented in this report are preliminary and upon coder review may  be revised to meet current compliance requirements. Jonathon Bellows, MD Jonathon Bellows MD, MD 03/11/2020 8:07:38 AM This  report has been signed electronically. Number of Addenda: 0 Note Initiated On: 03/11/2020 7:53 AM Estimated Blood Loss:  Estimated blood loss: none.      [facility]

## 2020-03-11 NOTE — Anesthesia Preprocedure Evaluation (Addendum)
Anesthesia Evaluation  Patient identified by MRN, date of birth, ID band Patient awake    Reviewed: Allergy & Precautions, H&P , NPO status , Patient's Chart, lab work & pertinent test results  Airway Mallampati: II  TM Distance: >3 FB     Dental  (+) Edentulous Upper, Missing   Pulmonary neg COPD, Current Smoker,    breath sounds clear to auscultation       Cardiovascular hypertension, (-) angina(-) Past MI  Rhythm:regular Rate:Normal     Neuro/Psych PSYCHIATRIC DISORDERS Anxiety negative neurological ROS     GI/Hepatic GERD  ,(+)     substance abuse  alcohol use,   Endo/Other  diabetes  Renal/GU negative Renal ROS  negative genitourinary   Musculoskeletal   Abdominal   Peds  Hematology negative hematology ROS (+)   Anesthesia Other Findings Past Medical History: No date: Diabetes mellitus without complication (HCC) No date: GERD (gastroesophageal reflux disease) No date: Hypertension  Past Surgical History: 07/21/2019: COLONOSCOPY WITH PROPOFOL; N/A     Comment:  Procedure: COLONOSCOPY WITH PROPOFOL;  Surgeon: Jonathon Bellows, MD;  Location: [facility] ENDOSCOPY;  Service:               Gastroenterology;  Laterality: N/A; No date: EYE SURGERY No date: FEMUR FRACTURE SURGERY; Left  BMI    Body Mass Index: 16.98 kg/m      Reproductive/Obstetrics negative OB ROS                            Anesthesia Physical Anesthesia Plan  ASA: III  Anesthesia Plan: General   Post-op Pain Management:    Induction:   PONV Risk Score and Plan: Propofol infusion and TIVA  Airway Management Planned: Nasal Cannula  Additional Equipment:   Intra-op Plan:   Post-operative Plan:   Informed Consent: I have reviewed the patients History and Physical, chart, labs and discussed the procedure including the risks, benefits and alternatives for the proposed anesthesia with the patient or  authorized representative who has indicated his/her understanding and acceptance.     Dental Advisory Given  Plan Discussed with: Anesthesiologist, CRNA and Surgeon  Anesthesia Plan Comments:         Anesthesia Quick Evaluation

## 2020-03-11 NOTE — Transfer of Care (Signed)
Immediate Anesthesia Transfer of Care Note  Patient: Derrick Jones  Procedure(s) Performed: ESOPHAGOGASTRODUODENOSCOPY (EGD) WITH PROPOFOL (N/A )  Patient Location: PACU and Endoscopy Unit  Anesthesia Type:General  Level of Consciousness: drowsy and patient cooperative  Airway & Oxygen Therapy: Patient Spontanous Breathing  Post-op Assessment: Report given to RN and Post -op Vital signs reviewed and stable  Post vital signs: Reviewed and stable  Last Vitals:  Vitals Value Taken Time  BP 98/61 03/11/20 0810  Temp    Pulse 65 03/11/20 0810  Resp 15 03/11/20 0810  SpO2 96 % 03/11/20 0810  Vitals shown include unvalidated device data.  Last Pain:  Vitals:   03/11/20 0810  TempSrc:   PainSc: 0-No pain         Complications: No complications documented.

## 2020-03-11 NOTE — H&P (Signed)
Derrick Bellows, MD [address] [place], [place], [place], [address] [address], [place], [place], [place], [address] Phone: [phone]  Fax: [phone]  Primary Care Physician:  Cletis Athens, MD   Pre-Procedure History & Physical: HPI:  Derrick Jones is a 74 y.o. male is here for an endoscopy    Past Medical History:  Diagnosis Date  . Diabetes mellitus without complication ([place])   . GERD (gastroesophageal reflux disease)   . Hypertension     Past Surgical History:  Procedure Laterality Date  . COLONOSCOPY WITH PROPOFOL N/A 07/21/2019   Procedure: COLONOSCOPY WITH PROPOFOL;  Surgeon: Derrick Bellows, MD;  Location: [facility] ENDOSCOPY;  Service: Gastroenterology;  Laterality: N/A;  . EYE SURGERY    . FEMUR FRACTURE SURGERY Left     Prior to Admission medications   Medication Sig Start Date End Date Taking? Authorizing Provider  atenolol-chlorthalidone (TENORETIC) 100-25 MG tablet Take 1 tablet by mouth once daily 02/02/20  Yes Masoud, Viann Shove, MD  albuterol (VENTOLIN HFA) 108 (90 Base) MCG/ACT inhaler Inhale 2 puffs into the lungs every 4 (four) hours as needed for wheezing or shortness of breath. 02/12/20   Duffy Bruce, MD  atorvastatin (LIPITOR) 20 MG tablet Take 20 mg by mouth daily. 11/14/19   [provider]  metFORMIN (GLUCOPHAGE) 500 MG tablet Take 1 tablet (500 mg total) by mouth daily. 01/03/20   Cletis Athens, MD  omeprazole (PRILOSEC) 20 MG capsule Take 1 capsule (20 mg total) by mouth daily. 12/18/19   Cletis Athens, MD    Allergies as of 12/20/2019  . (No Known Allergies)    Family History  Problem Relation Age of Onset  . Breast cancer Sister   . Ovarian cancer Sister     Social History   Socioeconomic History  . Marital status: Married    Spouse name: Not on file  . Number of children: Not on file  . Years of education: Not on file  . Highest education level: Not on file  Occupational History  . Not on file  Tobacco Use  . Smoking  status: Current Every Day Smoker    Packs/day: 1.00    Years: 53.00    Pack years: 53.00    Types: Cigarettes  . Smokeless tobacco: Never Used  Vaping Use  . Vaping Use: Never used  Substance and Sexual Activity  . Alcohol use: Yes  . Drug use: No  . Sexual activity: Not on file  Other Topics Concern  . Not on file  Social History Narrative  . Not on file   Social Determinants of Health   Financial Resource Strain:   . Difficulty of Paying Living Expenses:   Food Insecurity:   . Worried About Charity fundraiser in the Last Year:   . Arboriculturist in the Last Year:   Transportation Needs:   . Film/video editor (Medical):   [name] Lack of Transportation (Non-Medical):   Physical Activity:   . Days of Exercise per Week:   . Minutes of Exercise per Session:   Stress:   . Feeling of Stress :   Social Connections:   . Frequency of Communication with Friends and Family:   . Frequency of Social Gatherings with Friends and Family:   . Attends Religious Services:   . Active Member of Clubs or Organizations:   . Attends Archivist Meetings:   [name] Marital Status:   Intimate Partner Violence:   . Fear  of Current or Ex-Partner:   . Emotionally Abused:   [name] Physically Abused:   . Sexually Abused:     Review of Systems: See HPI, otherwise negative ROS  Physical Exam: BP (!) 151/92   Pulse 72   Temp (!) 97 F (36.1 C) (Temporal)   Resp 18   Ht 5\' 9"  (1.753 m)   Wt 52.2 kg   SpO2 97%   BMI 16.98 kg/m  General:   Alert,  pleasant and cooperative in NAD Head:  Normocephalic and atraumatic. Neck:  Supple; no masses or thyromegaly. Lungs:  Clear throughout to auscultation, normal respiratory effort.    Heart:  +S1, +S2, Regular rate and rhythm, No edema. Abdomen:  Soft, nontender and nondistended. Normal bowel sounds, without guarding, and without rebound.   Neurologic:  Alert and  oriented x4;  grossly normal neurologically.  Impression/Plan: Derrick Jones is  here for an endoscopy  to be performed for  evaluation of dysphagia    Risks, benefits, limitations, and alternatives regarding endoscopy have been reviewed with the patient.  Questions have been answered.  All parties agreeable.   Derrick Bellows, MD  03/11/2020, 7:48 AM

## 2020-03-12 LAB — SURGICAL PATHOLOGY

## 2020-03-12 NOTE — Anesthesia Postprocedure Evaluation (Signed)
Anesthesia Post Note  Patient: Derrick Jones  Procedure(s) Performed: ESOPHAGOGASTRODUODENOSCOPY (EGD) WITH PROPOFOL (N/A )  Patient location during evaluation: PACU Anesthesia Type: General Level of consciousness: awake and alert Pain management: pain level controlled Vital Signs Assessment: post-procedure vital signs reviewed and stable Respiratory status: spontaneous breathing, nonlabored ventilation and respiratory function stable Cardiovascular status: blood pressure returned to baseline and stable Postop Assessment: no apparent nausea or vomiting Anesthetic complications: no   No complications documented.   Last Vitals:  Vitals:   03/11/20 0820 03/11/20 0830  BP: (!) 135/84 (!) 154/89  Pulse: 65 66  Resp: 19 (!) 27  Temp:    SpO2: 97% 93%    Last Pain:  Vitals:   03/12/20 0801  TempSrc:   PainSc: 0-No pain                 Brett Canales Ande Therrell

## 2020-03-13 ENCOUNTER — Telehealth: Payer: Self-pay

## 2020-03-13 ENCOUNTER — Other Ambulatory Visit: Payer: Self-pay

## 2020-03-13 DIAGNOSIS — R1310 Dysphagia, unspecified: Secondary | ICD-10-CM

## 2020-03-13 MED ORDER — OMEPRAZOLE 40 MG PO CPDR
40.0000 mg | DELAYED_RELEASE_CAPSULE | Freq: Two times a day (BID) | ORAL | 1 refills | Status: DC
Start: 2020-03-13 — End: 2020-05-02

## 2020-03-13 NOTE — Telephone Encounter (Signed)
-----   Message from Jonathon Bellows, MD sent at 03/12/2020 10:57 PM EDT ----- Derrick Jones   Inform biopsies show some possible abnormal cells vs cells from inflammation  but not clear. Suggest commencing on Prilosec 40 mg BID- repeat EGD with biopsies in 4 weeks  C/c Cletis Athens, MD     Dr Jonathon Bellows MD,MRCP [facility]) Gastroenterology/Hepatology Pager: [phone]

## 2020-03-13 NOTE — Telephone Encounter (Signed)
Patient verbalized understanding of results. He states he has 90 pills of the omeprazole 20mg . He states he will take 2 capsules in the morning and 2 capsule at night. He scheduled his EGD for 04/11/2020. Went over instructions and mailed them.

## 2020-03-15 NOTE — Addendum Note (Signed)
Addended by: Cletis Athens on: 03/15/2020 04:39 PM   Modules accepted: Level of Service

## 2020-04-04 ENCOUNTER — Ambulatory Visit: Payer: PPO | Admitting: Internal Medicine

## 2020-04-04 ENCOUNTER — Encounter: Payer: Self-pay | Admitting: Gastroenterology

## 2020-04-09 ENCOUNTER — Other Ambulatory Visit: Payer: Self-pay

## 2020-04-09 ENCOUNTER — Other Ambulatory Visit
Admission: RE | Admit: 2020-04-09 | Discharge: 2020-04-09 | Disposition: A | Discharge status: Home / Self Care | Payer: PPO | Source: Ambulatory Visit | Attending: Gastroenterology | Admitting: Gastroenterology

## 2020-04-09 DIAGNOSIS — Z20822 Contact with and (suspected) exposure to covid-19: Secondary | ICD-10-CM | POA: Insufficient documentation

## 2020-04-09 DIAGNOSIS — Z01812 Encounter for preprocedural laboratory examination: Secondary | ICD-10-CM | POA: Insufficient documentation

## 2020-04-10 ENCOUNTER — Other Ambulatory Visit: Payer: Self-pay

## 2020-04-10 ENCOUNTER — Ambulatory Visit: Payer: PPO | Admitting: Gastroenterology

## 2020-04-10 VITALS — BP 105/73 | HR 66 | Temp 97.6°F | Ht 68.0 in | Wt 116.0 lb

## 2020-04-10 DIAGNOSIS — R1310 Dysphagia, unspecified: Secondary | ICD-10-CM | POA: Diagnosis not present

## 2020-04-10 DIAGNOSIS — K222 Esophageal obstruction: Secondary | ICD-10-CM | POA: Diagnosis not present

## 2020-04-10 LAB — SARS CORONAVIRUS 2 (TAT 6-24 HRS): SARS Coronavirus 2: NEGATIVE

## 2020-04-10 NOTE — Progress Notes (Signed)
Jonathon Bellows MD, MRCP(U.K) [address]  [place]  [place], [place] [address]  Main: [phone]  Fax: [phone]   Primary Care Physician: Cletis Athens, MD  Primary Gastroenterologist:  Dr. Jonathon Bellows   Follow-up for dysphagia  HPI: Derrick Jones is a 74 y.o. male    Summary of history :  Initially referred and seen in May 2021 for dysphagia ongoing for a few months affecting solid foods every time he eats.  Weight loss of 40 pounds.  History of smoking.Colonoscopy 07/2019: 4 polyps resected, tubular adenomas.   Interval history 12/19/2019-04/10/2020  Seventh 26 2021 EGD: Significant stenosis was seen at the GE junction concerning for neoplasm 1 cm in length.  Biopsies were taken with forceps.  Pathology showed squamous atypia in one 1 mm detached fragment.  Concerned that adequate sample may not have been obtained.  This is a following better after his last visit.  He has lost 9 pounds since his last visit  Current Outpatient Medications  Medication Sig Dispense Refill  . albuterol (VENTOLIN HFA) 108 (90 Base) MCG/ACT inhaler Inhale 2 puffs into the lungs every 4 (four) hours as needed for wheezing or shortness of breath. 6.7 g 0  . atenolol-chlorthalidone (TENORETIC) 100-25 MG tablet Take 1 tablet by mouth once daily 30 tablet 0  . atorvastatin (LIPITOR) 20 MG tablet Take 20 mg by mouth daily.    . metFORMIN (GLUCOPHAGE) 500 MG tablet Take 1 tablet (500 mg total) by mouth daily. 30 tablet 0  . omeprazole (PRILOSEC) 40 MG capsule Take 1 capsule (40 mg total) by mouth 2 (two) times daily before a meal. 60 capsule 1   No current facility-administered medications for this visit.    Allergies as of 04/10/2020  . (No Known Allergies)    ROS:  General: Negative for anorexia, weight loss, fever, chills, fatigue, weakness. ENT: Negative for hoarseness, difficulty swallowing , nasal congestion. CV: Negative for chest pain, angina, palpitations, dyspnea on exertion,  peripheral edema.  Respiratory: Negative for dyspnea at rest, dyspnea on exertion, cough, sputum, wheezing.  GI: See history of present illness. GU:  Negative for dysuria, hematuria, urinary incontinence, urinary frequency, nocturnal urination.  Endo: Negative for unusual weight change.    Physical Examination:   BP 105/73   Pulse 66   Temp 97.6 F (36.4 C)   Ht 5\' 8"  (1.727 m)   Wt 116 lb (52.6 kg)   BMI 17.64 kg/m   General: Well-nourished, well-developed in no acute distress.  Thin and cachectic Eyes: No icterus. Conjunctivae pink. Extremities: No lower extremity edema. No clubbing or deformities. Neuro: Alert and oriented x 3.  Grossly intact. Skin: Warm and dry, no jaundice.   Psych: Alert and cooperative, normal mood and affect.   Imaging Studies: No results found.  Assessment and Plan:   Derrick Jones is a 74 y.o. y/o male is here today to see me as a follow-up for dysphagia.  Initially seen in May 2021.  He has had a history of unintentional weight loss of over 40 pounds.  I performed an EGD in July 2021 which showed a significant stenosis at the GE junction and appearance concerning for neoplasm.  Biopsies were taken and only one small fragment showed some squamous atypia on pathology report.  Concerned that the pathology specimen may have been inadequate or may not have targeted the right area.  Plan 1.  He has been set up for an EGD tomorrow where I will take significantly  more biopsy samples to get a better yield.  I think at this point of time we should proceed with a CT scan of the chest to rule out any extra luminal extension of any neoplastic lesion as he has lost 9 pounds since his last visit.    Dr Jonathon Bellows  MD,MRCP [facility]) Follow up in 2 weeks

## 2020-04-11 ENCOUNTER — Encounter: Payer: Self-pay | Admitting: Gastroenterology

## 2020-04-11 ENCOUNTER — Ambulatory Visit
Admission: RE | Admit: 2020-04-11 | Discharge: 2020-04-11 | Disposition: A | Discharge status: Home / Self Care | Payer: PPO | Attending: Gastroenterology | Admitting: Gastroenterology

## 2020-04-11 ENCOUNTER — Other Ambulatory Visit: Payer: Self-pay

## 2020-04-11 ENCOUNTER — Ambulatory Visit: Payer: PPO | Admitting: Certified Registered"

## 2020-04-11 ENCOUNTER — Encounter
Admission: RE | Disposition: A | Discharge status: Home / Self Care | Payer: Self-pay | Source: Home / Self Care | Attending: Gastroenterology

## 2020-04-11 DIAGNOSIS — Z79899 Other long term (current) drug therapy: Secondary | ICD-10-CM | POA: Insufficient documentation

## 2020-04-11 DIAGNOSIS — Z7984 Long term (current) use of oral hypoglycemic drugs: Secondary | ICD-10-CM | POA: Diagnosis not present

## 2020-04-11 DIAGNOSIS — E7800 Pure hypercholesterolemia, unspecified: Secondary | ICD-10-CM | POA: Diagnosis not present

## 2020-04-11 DIAGNOSIS — R1310 Dysphagia, unspecified: Secondary | ICD-10-CM

## 2020-04-11 DIAGNOSIS — K209 Esophagitis, unspecified without bleeding: Secondary | ICD-10-CM | POA: Diagnosis not present

## 2020-04-11 DIAGNOSIS — I10 Essential (primary) hypertension: Secondary | ICD-10-CM | POA: Insufficient documentation

## 2020-04-11 DIAGNOSIS — K219 Gastro-esophageal reflux disease without esophagitis: Secondary | ICD-10-CM | POA: Insufficient documentation

## 2020-04-11 DIAGNOSIS — K2211 Ulcer of esophagus with bleeding: Secondary | ICD-10-CM | POA: Insufficient documentation

## 2020-04-11 DIAGNOSIS — E119 Type 2 diabetes mellitus without complications: Secondary | ICD-10-CM | POA: Insufficient documentation

## 2020-04-11 DIAGNOSIS — F17210 Nicotine dependence, cigarettes, uncomplicated: Secondary | ICD-10-CM | POA: Diagnosis not present

## 2020-04-11 DIAGNOSIS — K222 Esophageal obstruction: Secondary | ICD-10-CM | POA: Insufficient documentation

## 2020-04-11 HISTORY — PX: ESOPHAGOGASTRODUODENOSCOPY (EGD) WITH PROPOFOL: SHX5813

## 2020-04-11 LAB — GLUCOSE, CAPILLARY: Glucose-Capillary: 107 mg/dL — ABNORMAL HIGH (ref 70–99)

## 2020-04-11 SURGERY — ESOPHAGOGASTRODUODENOSCOPY (EGD) WITH PROPOFOL
Anesthesia: General

## 2020-04-11 MED ORDER — PROPOFOL 10 MG/ML IV BOLUS
INTRAVENOUS | Status: DC | PRN
  Administered 2020-04-11: 70 mg via INTRAVENOUS

## 2020-04-11 MED ORDER — SODIUM CHLORIDE 0.9 % IV SOLN
INTRAVENOUS | Status: DC
  Administered 2020-04-11: 1000 mL via INTRAVENOUS

## 2020-04-11 MED ORDER — PROPOFOL 500 MG/50ML IV EMUL
INTRAVENOUS | Status: AC
  Filled 2020-04-11: qty 50

## 2020-04-11 MED ORDER — PROPOFOL 500 MG/50ML IV EMUL
INTRAVENOUS | Status: DC | PRN
  Administered 2020-04-11: 150 ug/kg/min via INTRAVENOUS

## 2020-04-11 NOTE — Op Note (Signed)
[facility] Patient Name: Derrick Jones Procedure Date: 04/11/2020 7:48 AM MRN: [id number] Account #: [id number] Date of Birth: [birth year] Admit Type: Outpatient Age: 74 Room: [facility] ENDO ROOM 4 Gender: Male Note Status: Finalized Procedure:             Upper GI endoscopy Indications:           Dysphagia Providers:             Jonathon Bellows MD, MD Medicines:             Monitored Anesthesia Care Complications:         No immediate complications. Procedure:             Pre-Anesthesia Assessment:                        - Prior to the procedure, a History and Physical was                         performed, and patient medications, allergies and                         sensitivities were reviewed. The patient's tolerance                         of previous anesthesia was reviewed.                        - The risks and benefits of the procedure and the                         sedation options and risks were discussed with the                         patient. All questions were answered and informed                         consent was obtained.                        - ASA Grade Assessment: III - A patient with severe                         systemic disease.                        After obtaining informed consent, the endoscope was                         passed under direct vision. Throughout the procedure,                         the patient's blood pressure, pulse, and oxygen                         saturations were monitored continuously. The Endoscope                         was introduced through the mouth, and advanced to the  third part of duodenum. The upper GI endoscopy was                         accomplished with ease. The patient tolerated the                         procedure well. Findings:      The examined duodenum was normal.      The stomach was normal.      The cardia and gastric fundus were normal on  retroflexion.      Few superficial esophageal ulcers with stigmata of recent bleeding were       found at the gastroesophageal junction. Biopsies were taken with a cold       forceps for histology.      One benign-appearing, intrinsic mild (non-circumferential scarring)       stenosis was found at the gastroesophageal junction. The stenosis was       traversed. This was biopsied with a cold forceps for histology. Impression:            - Normal examined duodenum.                        - Normal stomach.                        - Esophageal ulcers with stigmata of recent bleeding.                         Biopsied.                        - Benign-appearing esophageal stenosis. Biopsied. Recommendation:        - Await pathology results.                        - Discharge patient to home (with escort).                        - Resume previous diet.                        - Continue present medications.                        - Return to my office in 6 weeks. Procedure Code(s):     --- Professional ---                        [id number], Esophagogastroduodenoscopy, flexible,                         transoral; with biopsy, single or multiple Diagnosis Code(s):     --- Professional ---                        K22.11, Ulcer of esophagus with bleeding                        K22.2, Esophageal obstruction                        R13.10, Dysphagia, unspecified CPT copyright 2019 American Medical Association. All  rights reserved. The codes documented in this report are preliminary and upon coder review may  be revised to meet current compliance requirements. Jonathon Bellows, MD Jonathon Bellows MD, MD 04/11/2020 8:31:34 AM This report has been signed electronically. Number of Addenda: 0 Note Initiated On: 04/11/2020 7:48 AM Estimated Blood Loss:  Estimated blood loss: none.      [facility]

## 2020-04-11 NOTE — Progress Notes (Signed)
   04/11/20 0740  Clinical Encounter Type  Visited With Family  Visit Type Initial  Referral From Chaplain  Consult/Referral To Chaplain  While rounding SDS waiting area, chaplain spoke with pt's wife. Chaplain checked ot make sure Mrs. Kulzer was alright while waiting. She ws reading and said she was fine.

## 2020-04-11 NOTE — H&P (Signed)
Jonathon Bellows, MD [address] [place], [place], [place], [address] [address], [place], [place], [place], [address] Phone: [phone]  Fax: [phone]  Primary Care Physician:  Cletis Athens, MD   Pre-Procedure History & Physical: HPI:  Derrick Jones is a 74 y.o. male is here for an endoscopy    Past Medical History:  Diagnosis Date  . Diabetes mellitus without complication ([place])   . GERD (gastroesophageal reflux disease)   . Hypertension     Past Surgical History:  Procedure Laterality Date  . COLONOSCOPY WITH PROPOFOL N/A 07/21/2019   Procedure: COLONOSCOPY WITH PROPOFOL;  Surgeon: Jonathon Bellows, MD;  Location: [facility] ENDOSCOPY;  Service: Gastroenterology;  Laterality: N/A;  . ESOPHAGOGASTRODUODENOSCOPY (EGD) WITH PROPOFOL N/A 03/11/2020   Procedure: ESOPHAGOGASTRODUODENOSCOPY (EGD) WITH PROPOFOL;  Surgeon: Jonathon Bellows, MD;  Location: [facility] ENDOSCOPY;  Service: Gastroenterology;  Laterality: N/A;  . EYE SURGERY    . FEMUR FRACTURE SURGERY Left   . FRACTURE SURGERY      Prior to Admission medications   Medication Sig Start Date End Date Taking? Authorizing Provider  atenolol-chlorthalidone (TENORETIC) 100-25 MG tablet Take 1 tablet by mouth once daily 02/02/20  Yes Masoud, Viann Shove, MD  atorvastatin (LIPITOR) 20 MG tablet Take 20 mg by mouth daily. 11/14/19  Yes [provider]  metFORMIN (GLUCOPHAGE) 500 MG tablet Take 1 tablet (500 mg total) by mouth daily. 01/03/20  Yes Masoud, Viann Shove, MD  omeprazole (PRILOSEC) 40 MG capsule Take 1 capsule (40 mg total) by mouth 2 (two) times daily before a meal. 03/13/20  Yes Jonathon Bellows, MD  albuterol (VENTOLIN HFA) 108 (90 Base) MCG/ACT inhaler Inhale 2 puffs into the lungs every 4 (four) hours as needed for wheezing or shortness of breath. 02/12/20   Duffy Bruce, MD    Allergies as of 03/13/2020  . (No Known Allergies)    Family History  Problem Relation Age of Onset  . Breast cancer Sister   . Ovarian cancer Sister       Social History   Socioeconomic History  . Marital status: Married    Spouse name: Not on file  . Number of children: Not on file  . Years of education: Not on file  . Highest education level: Not on file  Occupational History  . Not on file  Tobacco Use  . Smoking status: Current Every Day Smoker    Packs/day: 1.00    Years: 53.00    Pack years: 53.00    Types: Cigarettes  . Smokeless tobacco: Never Used  Vaping Use  . Vaping Use: Never used  Substance and Sexual Activity  . Alcohol use: Yes  . Drug use: No  . Sexual activity: Not on file  Other Topics Concern  . Not on file  Social History Narrative  . Not on file   Social Determinants of Health   Financial Resource Strain:   . Difficulty of Paying Living Expenses: Not on file  Food Insecurity:   . Worried About Charity fundraiser in the Last Year: Not on file  . Ran Out of Food in the Last Year: Not on file  Transportation Needs:   . Lack of Transportation (Medical): Not on file  . Lack of Transportation (Non-Medical): Not on file  Physical Activity:   . Days of Exercise per Week: Not on file  . Minutes of Exercise per Session: Not on file  Stress:   . Feeling of Stress : Not on file  Social  Connections:   . Frequency of Communication with Friends and Family: Not on file  . Frequency of Social Gatherings with Friends and Family: Not on file  . Attends Religious Services: Not on file  . Active Member of Clubs or Organizations: Not on file  . Attends Archivist Meetings: Not on file  . Marital Status: Not on file  Intimate Partner Violence:   . Fear of Current or Ex-Partner: Not on file  . Emotionally Abused: Not on file  . Physically Abused: Not on file  . Sexually Abused: Not on file    Review of Systems: See HPI, otherwise negative ROS  Physical Exam: BP 125/85   Pulse 77   Temp (!) 96.9 F (36.1 C) (Temporal)   Resp 18   Ht 5\' 9"  (1.753 m)   Wt 52.3 kg   SpO2 100%   BMI 17.02  kg/m  General:   Alert,  pleasant and cooperative in NAD Head:  Normocephalic and atraumatic. Neck:  Supple; no masses or thyromegaly. Lungs:  Clear throughout to auscultation, normal respiratory effort.    Heart:  +S1, +S2, Regular rate and rhythm, No edema. Abdomen:  Soft, nontender and nondistended. Normal bowel sounds, without guarding, and without rebound.   Neurologic:  Alert and  oriented x4;  grossly normal neurologically.  Impression/Plan: Derrick Jones is here for an endoscopy  to be performed for  evaluation of dysphagia    Risks, benefits, limitations, and alternatives regarding endoscopy have been reviewed with the patient.  Questions have been answered.  All parties agreeable.   Jonathon Bellows, MD  04/11/2020, 8:12 AM

## 2020-04-11 NOTE — Anesthesia Preprocedure Evaluation (Signed)
Anesthesia Evaluation  Patient identified by MRN, date of birth, ID band Patient awake    Reviewed: Allergy & Precautions, H&P , NPO status , Patient's Chart, lab work & pertinent test results  Airway Mallampati: II  TM Distance: >3 FB     Dental  (+) Edentulous Upper, Missing   Pulmonary neg COPD, Current Smoker,    breath sounds clear to auscultation       Cardiovascular hypertension, (-) angina(-) Past MI  Rhythm:regular Rate:Normal     Neuro/Psych PSYCHIATRIC DISORDERS Anxiety negative neurological ROS     GI/Hepatic GERD  ,(+)     substance abuse  alcohol use,   Endo/Other  diabetes  Renal/GU negative Renal ROS  negative genitourinary   Musculoskeletal   Abdominal   Peds negative pediatric ROS (+)  Hematology negative hematology ROS (+)   Anesthesia Other Findings Past Medical History: No date: Diabetes mellitus without complication (HCC) No date: GERD (gastroesophageal reflux disease) No date: Hypertension  Past Surgical History: 07/21/2019: COLONOSCOPY WITH PROPOFOL; N/A     Comment:  Procedure: COLONOSCOPY WITH PROPOFOL;  Surgeon: Jonathon Bellows, MD;  Location: [facility] ENDOSCOPY;  Service:               Gastroenterology;  Laterality: N/A; No date: EYE SURGERY No date: FEMUR FRACTURE SURGERY; Left  BMI    Body Mass Index: 16.98 kg/m      Reproductive/Obstetrics negative OB ROS                             Anesthesia Physical  Anesthesia Plan  ASA: III  Anesthesia Plan: General   Post-op Pain Management:    Induction:   PONV Risk Score and Plan: Propofol infusion and TIVA  Airway Management Planned: Nasal Cannula  Additional Equipment:   Intra-op Plan:   Post-operative Plan:   Informed Consent: I have reviewed the patients History and Physical, chart, labs and discussed the procedure including the risks, benefits and alternatives for the proposed  anesthesia with the patient or authorized representative who has indicated his/her understanding and acceptance.     Dental Advisory Given  Plan Discussed with: Anesthesiologist, CRNA and Surgeon  Anesthesia Plan Comments:         Anesthesia Quick Evaluation

## 2020-04-11 NOTE — Transfer of Care (Signed)
Immediate Anesthesia Transfer of Care Note  Patient: Derrick Jones  Procedure(s) Performed: ESOPHAGOGASTRODUODENOSCOPY (EGD) WITH PROPOFOL (N/A )  Patient Location: PACU  Anesthesia Type:General  Level of Consciousness: awake and alert   Airway & Oxygen Therapy: Patient Spontanous Breathing and Patient connected to nasal cannula oxygen  Post-op Assessment: Report given to RN and Post -op Vital signs reviewed and stable  Post vital signs: Reviewed and stable  Last Vitals:  Vitals Value Taken Time  BP 93/70 04/11/20 0839  Temp 36.1 C 04/11/20 0833  Pulse 93 04/11/20 0839  Resp 14 04/11/20 0840  SpO2 86 % 04/11/20 0839  Vitals shown include unvalidated device data.  Last Pain:  Vitals:   04/11/20 0833  TempSrc: Temporal  PainSc: Asleep         Complications: No complications documented.

## 2020-04-12 ENCOUNTER — Encounter: Payer: Self-pay | Admitting: Gastroenterology

## 2020-04-12 LAB — SURGICAL PATHOLOGY

## 2020-04-12 NOTE — Anesthesia Postprocedure Evaluation (Signed)
Anesthesia Post Note  Patient: Derrick Jones  Procedure(s) Performed: ESOPHAGOGASTRODUODENOSCOPY (EGD) WITH PROPOFOL (N/A )  Patient location during evaluation: Endoscopy Anesthesia Type: General Level of consciousness: awake and alert and oriented Pain management: pain level controlled Vital Signs Assessment: post-procedure vital signs reviewed and stable Respiratory status: spontaneous breathing Cardiovascular status: blood pressure returned to baseline Anesthetic complications: no   No complications documented.   Last Vitals:  Vitals:   04/11/20 0843 04/11/20 0850  BP:  119/78  Pulse: 73 76  Resp: 16 20  Temp:    SpO2: 98% 99%    Last Pain:  Vitals:   04/12/20 0726  TempSrc:   PainSc: 0-No pain                 Janessa Mickle

## 2020-04-23 ENCOUNTER — Telehealth: Payer: Self-pay

## 2020-04-23 ENCOUNTER — Telehealth: Payer: Self-pay | Admitting: Gastroenterology

## 2020-04-23 NOTE — Telephone Encounter (Signed)
Per Dr. Vicente Males Patient needs a 6 week follow up appointment with him

## 2020-04-23 NOTE — Telephone Encounter (Signed)
LVM for Patient to call our office to schedule a follow up appt. With Dr. Vicente Males.

## 2020-04-30 ENCOUNTER — Ambulatory Visit: Payer: PPO

## 2020-05-01 ENCOUNTER — Other Ambulatory Visit: Payer: Self-pay | Admitting: Internal Medicine

## 2020-05-02 ENCOUNTER — Inpatient Hospital Stay
Admission: EM | Admit: 2020-05-02 | Discharge: 2020-05-07 | DRG: 480 | Disposition: A | Discharge status: Skilled Nursing Facility | Payer: PPO | Attending: Family Medicine | Admitting: Family Medicine

## 2020-05-02 ENCOUNTER — Emergency Department: Payer: PPO

## 2020-05-02 ENCOUNTER — Encounter: Payer: Self-pay | Admitting: Emergency Medicine

## 2020-05-02 ENCOUNTER — Other Ambulatory Visit: Payer: Self-pay

## 2020-05-02 ENCOUNTER — Inpatient Hospital Stay: Payer: PPO

## 2020-05-02 DIAGNOSIS — E119 Type 2 diabetes mellitus without complications: Secondary | ICD-10-CM | POA: Diagnosis not present

## 2020-05-02 DIAGNOSIS — F1010 Alcohol abuse, uncomplicated: Secondary | ICD-10-CM | POA: Diagnosis present

## 2020-05-02 DIAGNOSIS — R918 Other nonspecific abnormal finding of lung field: Secondary | ICD-10-CM | POA: Diagnosis not present

## 2020-05-02 DIAGNOSIS — W010XXA Fall on same level from slipping, tripping and stumbling without subsequent striking against object, initial encounter: Secondary | ICD-10-CM | POA: Diagnosis present

## 2020-05-02 DIAGNOSIS — S72142K Displaced intertrochanteric fracture of left femur, subsequent encounter for closed fracture with nonunion: Secondary | ICD-10-CM

## 2020-05-02 DIAGNOSIS — R41841 Cognitive communication deficit: Secondary | ICD-10-CM | POA: Diagnosis not present

## 2020-05-02 DIAGNOSIS — Z9181 History of falling: Secondary | ICD-10-CM | POA: Diagnosis not present

## 2020-05-02 DIAGNOSIS — Z79899 Other long term (current) drug therapy: Secondary | ICD-10-CM | POA: Diagnosis not present

## 2020-05-02 DIAGNOSIS — S72145A Nondisplaced intertrochanteric fracture of left femur, initial encounter for closed fracture: Secondary | ICD-10-CM | POA: Diagnosis not present

## 2020-05-02 DIAGNOSIS — S72141A Displaced intertrochanteric fracture of right femur, initial encounter for closed fracture: Secondary | ICD-10-CM | POA: Diagnosis not present

## 2020-05-02 DIAGNOSIS — M25552 Pain in left hip: Secondary | ICD-10-CM | POA: Diagnosis not present

## 2020-05-02 DIAGNOSIS — S72002D Fracture of unspecified part of neck of left femur, subsequent encounter for closed fracture with routine healing: Secondary | ICD-10-CM | POA: Diagnosis not present

## 2020-05-02 DIAGNOSIS — R911 Solitary pulmonary nodule: Secondary | ICD-10-CM | POA: Diagnosis not present

## 2020-05-02 DIAGNOSIS — M6281 Muscle weakness (generalized): Secondary | ICD-10-CM | POA: Diagnosis not present

## 2020-05-02 DIAGNOSIS — R52 Pain, unspecified: Secondary | ICD-10-CM | POA: Diagnosis not present

## 2020-05-02 DIAGNOSIS — Z7984 Long term (current) use of oral hypoglycemic drugs: Secondary | ICD-10-CM | POA: Diagnosis not present

## 2020-05-02 DIAGNOSIS — W19XXXA Unspecified fall, initial encounter: Secondary | ICD-10-CM

## 2020-05-02 DIAGNOSIS — S72142D Displaced intertrochanteric fracture of left femur, subsequent encounter for closed fracture with routine healing: Secondary | ICD-10-CM | POA: Diagnosis not present

## 2020-05-02 DIAGNOSIS — U071 COVID-19: Secondary | ICD-10-CM | POA: Diagnosis present

## 2020-05-02 DIAGNOSIS — J449 Chronic obstructive pulmonary disease, unspecified: Secondary | ICD-10-CM | POA: Diagnosis not present

## 2020-05-02 DIAGNOSIS — Z23 Encounter for immunization: Secondary | ICD-10-CM | POA: Diagnosis not present

## 2020-05-02 DIAGNOSIS — Y92007 Garden or yard of unspecified non-institutional (private) residence as the place of occurrence of the external cause: Secondary | ICD-10-CM | POA: Diagnosis not present

## 2020-05-02 DIAGNOSIS — E7800 Pure hypercholesterolemia, unspecified: Secondary | ICD-10-CM | POA: Diagnosis not present

## 2020-05-02 DIAGNOSIS — F10931 Alcohol use, unspecified with withdrawal delirium: Secondary | ICD-10-CM | POA: Diagnosis present

## 2020-05-02 DIAGNOSIS — W19XXXD Unspecified fall, subsequent encounter: Secondary | ICD-10-CM | POA: Diagnosis not present

## 2020-05-02 DIAGNOSIS — D72829 Elevated white blood cell count, unspecified: Secondary | ICD-10-CM | POA: Diagnosis not present

## 2020-05-02 DIAGNOSIS — R441 Visual hallucinations: Secondary | ICD-10-CM | POA: Diagnosis present

## 2020-05-02 DIAGNOSIS — F419 Anxiety disorder, unspecified: Secondary | ICD-10-CM | POA: Diagnosis present

## 2020-05-02 DIAGNOSIS — M6259 Muscle wasting and atrophy, not elsewhere classified, multiple sites: Secondary | ICD-10-CM | POA: Diagnosis not present

## 2020-05-02 DIAGNOSIS — S72142A Displaced intertrochanteric fracture of left femur, initial encounter for closed fracture: Secondary | ICD-10-CM | POA: Diagnosis not present

## 2020-05-02 DIAGNOSIS — Z741 Need for assistance with personal care: Secondary | ICD-10-CM | POA: Diagnosis not present

## 2020-05-02 DIAGNOSIS — S72002A Fracture of unspecified part of neck of left femur, initial encounter for closed fracture: Secondary | ICD-10-CM

## 2020-05-02 DIAGNOSIS — K219 Gastro-esophageal reflux disease without esophagitis: Secondary | ICD-10-CM | POA: Diagnosis not present

## 2020-05-02 DIAGNOSIS — F10231 Alcohol dependence with withdrawal delirium: Secondary | ICD-10-CM | POA: Diagnosis present

## 2020-05-02 DIAGNOSIS — R278 Other lack of coordination: Secondary | ICD-10-CM | POA: Diagnosis not present

## 2020-05-02 DIAGNOSIS — F4323 Adjustment disorder with mixed anxiety and depressed mood: Secondary | ICD-10-CM | POA: Diagnosis not present

## 2020-05-02 DIAGNOSIS — S060X1D Concussion with loss of consciousness of 30 minutes or less, subsequent encounter: Secondary | ICD-10-CM | POA: Diagnosis not present

## 2020-05-02 DIAGNOSIS — I10 Essential (primary) hypertension: Secondary | ICD-10-CM | POA: Diagnosis not present

## 2020-05-02 DIAGNOSIS — Z419 Encounter for procedure for purposes other than remedying health state, unspecified: Secondary | ICD-10-CM

## 2020-05-02 DIAGNOSIS — F17210 Nicotine dependence, cigarettes, uncomplicated: Secondary | ICD-10-CM | POA: Diagnosis not present

## 2020-05-02 DIAGNOSIS — S8992XA Unspecified injury of left lower leg, initial encounter: Secondary | ICD-10-CM | POA: Diagnosis not present

## 2020-05-02 DIAGNOSIS — Z4789 Encounter for other orthopedic aftercare: Secondary | ICD-10-CM | POA: Diagnosis not present

## 2020-05-02 HISTORY — DX: Alcohol use, unspecified with withdrawal delirium: F10.931

## 2020-05-02 HISTORY — DX: Displaced intertrochanteric fracture of left femur, initial encounter for closed fracture: S72.142A

## 2020-05-02 LAB — COMPREHENSIVE METABOLIC PANEL
ALT: 43 U/L (ref 0–44)
AST: 104 U/L — ABNORMAL HIGH (ref 15–41)
Albumin: 3.7 g/dL (ref 3.5–5.0)
Alkaline Phosphatase: 41 U/L (ref 38–126)
Anion gap: 17 — ABNORMAL HIGH (ref 5–15)
BUN: 14 mg/dL (ref 8–23)
CO2: 21 mmol/L — ABNORMAL LOW (ref 22–32)
Calcium: 8.9 mg/dL (ref 8.9–10.3)
Chloride: 93 mmol/L — ABNORMAL LOW (ref 98–111)
Creatinine, Ser: 0.89 mg/dL (ref 0.61–1.24)
GFR calc Af Amer: >60 mL/min (ref >60)
GFR calc non Af Amer: >60 mL/min (ref >60)
Glucose, Bld: 146 mg/dL — ABNORMAL HIGH (ref 70–99)
Potassium: 3.6 mmol/L (ref 3.5–5.1)
Sodium: 131 mmol/L — ABNORMAL LOW (ref 135–145)
Total Bilirubin: 1.4 mg/dL — ABNORMAL HIGH (ref 0.3–1.2)
Total Protein: 6.8 g/dL (ref 6.5–8.1)

## 2020-05-02 LAB — URINALYSIS, COMPLETE (UACMP) WITH MICROSCOPIC
Bilirubin Urine: NEGATIVE
Glucose, UA: NEGATIVE mg/dL
Hgb urine dipstick: NEGATIVE
Ketones, ur: NEGATIVE mg/dL
Leukocytes,Ua: NEGATIVE
Nitrite: POSITIVE — AB
Protein, ur: NEGATIVE mg/dL
Specific Gravity, Urine: 1.017 (ref 1.005–1.030)
Squamous Epithelial / HPF: NONE SEEN (ref 0–5)
pH: 6 (ref 5.0–8.0)

## 2020-05-02 LAB — SARS CORONAVIRUS 2 BY RT PCR (HOSPITAL ORDER, PERFORMED IN ~~LOC~~ HOSPITAL LAB): SARS Coronavirus 2: POSITIVE — AB

## 2020-05-02 LAB — CBC
HCT: 38.3 % — ABNORMAL LOW (ref 39.0–52.0)
Hemoglobin: 13.2 g/dL (ref 13.0–17.0)
MCH: 31.5 pg (ref 26.0–34.0)
MCHC: 34.5 g/dL (ref 30.0–36.0)
MCV: 91.4 fL (ref 80.0–100.0)
Platelets: 283 10*3/uL (ref 150–400)
RBC: 4.19 MIL/uL — ABNORMAL LOW (ref 4.22–5.81)
RDW: 14.6 % (ref 11.5–15.5)
WBC: 26.8 10*3/uL — ABNORMAL HIGH (ref 4.0–10.5)
nRBC: 0 % (ref 0.0–0.2)

## 2020-05-02 LAB — APTT: aPTT: 35 seconds (ref 24–36)

## 2020-05-02 LAB — HEMOGLOBIN A1C
Hgb A1c MFr Bld: 6.4 % — ABNORMAL HIGH (ref 4.8–5.6)
Mean Plasma Glucose: 136.98 mg/dL

## 2020-05-02 LAB — GLUCOSE, CAPILLARY
Glucose-Capillary: 71 mg/dL (ref 70–99)
Glucose-Capillary: 117 mg/dL — ABNORMAL HIGH (ref 70–99)
Glucose-Capillary: 106 mg/dL — ABNORMAL HIGH (ref 70–99)
Glucose-Capillary: 129 mg/dL — ABNORMAL HIGH (ref 70–99)

## 2020-05-02 LAB — PROTIME-INR
INR: 1.2 (ref 0.8–1.2)
Prothrombin Time: 14.3 seconds (ref 11.4–15.2)

## 2020-05-02 MED ORDER — METHOCARBAMOL 1000 MG/10ML IJ SOLN
500.0000 mg | Freq: Four times a day (QID) | INTRAVENOUS | Status: DC | PRN
  Filled 2020-05-02: qty 5

## 2020-05-02 MED ORDER — INSULIN ASPART 100 UNIT/ML ~~LOC~~ SOLN
0.0000 [IU] | SUBCUTANEOUS | Status: DC
  Administered 2020-05-04 – 2020-05-05 (×2): 1 [IU] via SUBCUTANEOUS
  Administered 2020-05-05: 2 [IU] via SUBCUTANEOUS
  Administered 2020-05-05: 3 [IU] via SUBCUTANEOUS
  Administered 2020-05-06 – 2020-05-07 (×4): 1 [IU] via SUBCUTANEOUS
  Filled 2020-05-02 (×8): qty 1

## 2020-05-02 MED ORDER — FENTANYL CITRATE (PF) 100 MCG/2ML IJ SOLN
50.0000 ug | Freq: Once | INTRAMUSCULAR | Status: AC
  Administered 2020-05-02: 50 ug via INTRAVENOUS
  Filled 2020-05-02: qty 2

## 2020-05-02 MED ORDER — ONDANSETRON HCL 4 MG/2ML IJ SOLN
4.0000 mg | Freq: Once | INTRAMUSCULAR | Status: AC
  Administered 2020-05-02: 4 mg via INTRAVENOUS
  Filled 2020-05-02: qty 2

## 2020-05-02 MED ORDER — ALBUTEROL SULFATE (2.5 MG/3ML) 0.083% IN NEBU: 2.5000 mg | INHALATION_SOLUTION | RESPIRATORY_TRACT | Status: DC | PRN

## 2020-05-02 MED ORDER — METHOCARBAMOL 500 MG PO TABS
500.0000 mg | ORAL_TABLET | Freq: Four times a day (QID) | ORAL | Status: DC | PRN
  Filled 2020-05-02: qty 1

## 2020-05-02 MED ORDER — SODIUM CHLORIDE 0.9% FLUSH: 3.0000 mL | INTRAVENOUS | Status: DC | PRN

## 2020-05-02 MED ORDER — EPINEPHRINE 0.3 MG/0.3ML IJ SOAJ
0.3000 mg | Freq: Once | INTRAMUSCULAR | Status: DC | PRN
  Filled 2020-05-02: qty 0.3

## 2020-05-02 MED ORDER — FOLIC ACID 1 MG PO TABS
1.0000 mg | ORAL_TABLET | Freq: Every day | ORAL | Status: DC
  Administered 2020-05-02: 1 mg via ORAL
  Filled 2020-05-02: qty 1

## 2020-05-02 MED ORDER — METHYLPREDNISOLONE SODIUM SUCC 125 MG IJ SOLR: 125.0000 mg | Freq: Once | INTRAMUSCULAR | Status: DC | PRN

## 2020-05-02 MED ORDER — MORPHINE SULFATE (PF) 2 MG/ML IV SOLN
0.5000 mg | INTRAVENOUS | Status: DC | PRN
  Administered 2020-05-02: 0.5 mg via INTRAVENOUS
  Filled 2020-05-02: qty 1

## 2020-05-02 MED ORDER — ATENOLOL 100 MG PO TABS
100.0000 mg | ORAL_TABLET | Freq: Every day | ORAL | Status: DC
  Administered 2020-05-04 – 2020-05-07 (×4): 100 mg via ORAL
  Filled 2020-05-02 (×4): qty 1

## 2020-05-02 MED ORDER — ADULT MULTIVITAMIN W/MINERALS CH
1.0000 | ORAL_TABLET | Freq: Every day | ORAL | Status: DC
  Administered 2020-05-02 – 2020-05-05 (×3): 1 via ORAL
  Filled 2020-05-02 (×3): qty 1

## 2020-05-02 MED ORDER — PANTOPRAZOLE SODIUM 40 MG PO TBEC
40.0000 mg | DELAYED_RELEASE_TABLET | Freq: Every day | ORAL | Status: DC
  Administered 2020-05-02 – 2020-05-07 (×5): 40 mg via ORAL
  Filled 2020-05-02 (×5): qty 1

## 2020-05-02 MED ORDER — SODIUM CHLORIDE 0.9% FLUSH
3.0000 mL | Freq: Two times a day (BID) | INTRAVENOUS | Status: DC
  Administered 2020-05-02: 3 mL via INTRAVENOUS

## 2020-05-02 MED ORDER — SODIUM CHLORIDE 0.9 % IV SOLN: 250.0000 mL | INTRAVENOUS | Status: DC | PRN

## 2020-05-02 MED ORDER — SODIUM CHLORIDE 0.9 % IV SOLN
1200.0000 mg | Freq: Once | INTRAVENOUS | Status: AC
  Administered 2020-05-02: 1200 mg via INTRAVENOUS
  Filled 2020-05-02: qty 10

## 2020-05-02 MED ORDER — SODIUM CHLORIDE 0.9 % IV SOLN: INTRAVENOUS | Status: DC | PRN

## 2020-05-02 MED ORDER — ATENOLOL-CHLORTHALIDONE 100-25 MG PO TABS: 1.0000 | ORAL_TABLET | Freq: Every day | ORAL | Status: DC

## 2020-05-02 MED ORDER — FAMOTIDINE IN NACL 20-0.9 MG/50ML-% IV SOLN: 20.0000 mg | Freq: Once | INTRAVENOUS | Status: DC | PRN

## 2020-05-02 MED ORDER — CHLORTHALIDONE 25 MG PO TABS
25.0000 mg | ORAL_TABLET | Freq: Every day | ORAL | Status: DC
  Administered 2020-05-02: 25 mg via ORAL
  Filled 2020-05-02: qty 1

## 2020-05-02 MED ORDER — ATORVASTATIN CALCIUM 20 MG PO TABS
20.0000 mg | ORAL_TABLET | Freq: Every day | ORAL | Status: DC
  Administered 2020-05-02 – 2020-05-07 (×5): 20 mg via ORAL
  Filled 2020-05-02 (×5): qty 1

## 2020-05-02 MED ORDER — THIAMINE HCL 100 MG/ML IJ SOLN
100.0000 mg | Freq: Every day | INTRAMUSCULAR | Status: DC
  Filled 2020-05-02: qty 2

## 2020-05-02 MED ORDER — PANTOPRAZOLE SODIUM 40 MG PO TBEC: 40.0000 mg | DELAYED_RELEASE_TABLET | Freq: Every day | ORAL | Status: DC

## 2020-05-02 MED ORDER — HYDROCODONE-ACETAMINOPHEN 5-325 MG PO TABS
1.0000 | ORAL_TABLET | Freq: Four times a day (QID) | ORAL | Status: DC | PRN
  Administered 2020-05-02: 2 via ORAL
  Filled 2020-05-02: qty 2

## 2020-05-02 MED ORDER — THIAMINE HCL 100 MG/ML IJ SOLN
Freq: Once | INTRAVENOUS | Status: AC
  Filled 2020-05-02: qty 1000

## 2020-05-02 MED ORDER — CEFAZOLIN SODIUM-DEXTROSE 1-4 GM/50ML-% IV SOLN
1.0000 g | Freq: Once | INTRAVENOUS | Status: AC
  Administered 2020-05-03: 1 g via INTRAVENOUS

## 2020-05-02 MED ORDER — THIAMINE HCL 100 MG/ML IJ SOLN
100.0000 mg | Freq: Once | INTRAMUSCULAR | Status: AC
  Administered 2020-05-02: 100 mg via INTRAVENOUS

## 2020-05-02 MED ORDER — ATENOLOL 100 MG PO TABS
100.0000 mg | ORAL_TABLET | Freq: Every day | ORAL | Status: DC
  Administered 2020-05-02: 100 mg via ORAL
  Filled 2020-05-02: qty 1
  Filled 2020-05-02: qty 4

## 2020-05-02 MED ORDER — MELATONIN 3 MG PO TABS
3.0000 mg | ORAL_TABLET | Freq: Every day | ORAL | Status: DC
  Filled 2020-05-02: qty 1

## 2020-05-02 MED ORDER — LORAZEPAM 2 MG/ML IJ SOLN
1.0000 mg | INTRAMUSCULAR | Status: DC | PRN
  Administered 2020-05-02 (×2): 2 mg via INTRAVENOUS
  Administered 2020-05-02: 3 mg via INTRAVENOUS
  Administered 2020-05-02 (×3): 2 mg via INTRAVENOUS
  Filled 2020-05-02 (×2): qty 1
  Filled 2020-05-02: qty 2
  Filled 2020-05-02 (×3): qty 1

## 2020-05-02 MED ORDER — THIAMINE HCL 100 MG PO TABS
100.0000 mg | ORAL_TABLET | Freq: Every day | ORAL | Status: DC
  Administered 2020-05-02 – 2020-05-05 (×3): 100 mg via ORAL
  Filled 2020-05-02 (×3): qty 1

## 2020-05-02 MED ORDER — LORAZEPAM 1 MG PO TABS: 1.0000 mg | ORAL_TABLET | ORAL | Status: DC | PRN

## 2020-05-02 MED ORDER — DIPHENHYDRAMINE HCL 50 MG/ML IJ SOLN: 50.0000 mg | Freq: Once | INTRAMUSCULAR | Status: DC | PRN

## 2020-05-02 MED ORDER — POLYETHYLENE GLYCOL 3350 17 G PO PACK: 17.0000 g | PACK | Freq: Every day | ORAL | Status: DC | PRN

## 2020-05-02 MED ORDER — SODIUM CHLORIDE 0.9 % IV SOLN
1000.0000 mL | Freq: Once | INTRAVENOUS | Status: AC
  Administered 2020-05-02: 1000 mL via INTRAVENOUS

## 2020-05-02 MED ORDER — MELATONIN 5 MG PO TABS
2.5000 mg | ORAL_TABLET | Freq: Every day | ORAL | Status: DC
  Administered 2020-05-03 – 2020-05-06 (×4): 2.5 mg via ORAL
  Filled 2020-05-02 (×7): qty 0.5

## 2020-05-02 NOTE — ED Notes (Signed)
Telesitter in use. Floor mat in use. [place] socks on. Bed locked low. Rails up. Call bell within reach. About 200cc dark urine noted in canister. Pt currently laying calmly in bed with eyes closed. Chest rise and fall visible.

## 2020-05-02 NOTE — ED Notes (Signed)
Telesitter called this RN to inform pt had turned self sideways in bed with his head between the upper and lower rails. Pt repositioned and briefs changed by this RN and Mimi RN as external cath had come out of place. Cath repositioned.

## 2020-05-02 NOTE — ED Notes (Signed)
Pt shifting around in bed but not currently pulling at lines. Pt otherwise calm.

## 2020-05-02 NOTE — ED Notes (Signed)
Pt c/o L hip pain. To be given pain meds soon.

## 2020-05-02 NOTE — ED Triage Notes (Signed)
Presents from home s/p fll last pm    Having pain to left hip/knee  Positive shortening noted to hip

## 2020-05-02 NOTE — ED Notes (Signed)
Telesitter called this RN stating pt is "twirled up in his lines". Mimi RN went to bedside and IV line is okay; pt is laying calmly in bed.
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
## 2020-05-02 NOTE — ED Notes (Signed)
This RN and Mimi RN received verbal consent from pt's wife over the phone after provider thoroughly discussed hip procedure with her. Wife denied any questions.

## 2020-05-02 NOTE — TOC Initial Note (Signed)
Transition of Care [facility]) - Initial/Assessment Note    Patient Details  Name: Derrick Jones MRN: [id number] Date of Birth: 1946/02/17  Transition of Care [facility]) CM/SW Contact:    Anselm Pancoast, RN Phone Number: 05/02/2020, 10:31 AM  Clinical Narrative:                 Patient out of room at time of assessment for imaging. TOC RN CM returned to room once patient returned and updated patient is COVID (+) and refusing to wear mask (possibly due to confusion). TOC RN CM will outreach to family or attempt to complete assessment telephonically. Anticipate patient will need SNF placement. Will monitor ETOH withdrawal.         Patient Goals and CMS Choice        Expected Discharge Plan and Services                                                Prior Living Arrangements/Services                       Activities of Daily Living      Permission Sought/Granted                  Emotional Assessment              Admission diagnosis:  Intertrochanteric fracture of left femur ([place]) [S72.142A] Intertrochanteric fracture of left hip ([place]) [S72.142A] Patient Active Problem List   Diagnosis Date Noted  . Intertrochanteric fracture of left femur ([place]) 05/02/2020  . Leukocytosis 05/02/2020  . Fall 05/02/2020  . COVID-19 virus detected 05/02/2020  . Intertrochanteric fracture of left hip ([place]) 05/02/2020  . Alcohol abuse 02/27/2020  . Concussion wth loss of consciousness of 30 minutes or less 02/27/2020  . Alcoholic ketoacidosis 75/17/0017  . History of alcohol abuse 08/02/2017  . Hypercholesterolemia 08/02/2017  . Heartburn 07/02/2017  . Prediabetes 05/27/2017  . Adjustment disorder with mixed anxiety and depressed mood 05/11/2017  . Anxiety 05/11/2017  . Hypertension 05/11/2017  . Insomnia 05/11/2017  . History of gastric ulcer 05/11/2017  . History of esophageal stricture 05/11/2017   PCP:  Cletis Athens, MD Pharmacy:   [facility] [address], [place] - [place] [address] [place] [address] Phone: [phone] Fax: [phone]     Social Determinants of Health (SDOH) Interventions    Readmission Risk Interventions No flowsheet data found.

## 2020-05-02 NOTE — ED Notes (Signed)
Spoke with pt wife, states the pt does drink a few beers on Friday or Saturday night but does not drink on a daily basis.
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
## 2020-05-02 NOTE — H&P (Signed)
History and Physical    POLK MINOR KGY:[id number] DOB: 1945-09-04 DOA: 05/02/2020  PCP: Cletis Athens, MD   Patient coming from: Home  I have personally briefly reviewed patient's old medical records in [place]  Chief Complaint: Status post fall Patient going through alcohol withdrawal unable to provide any history  HPI: Derrick Jones is a 74 y.o. male with medical history significant for alcohol abuse, GERD hypertension and anxiety disorder who presented to the ER for evaluation of pain in his left hip and difficulty with ambulation.  Patient had been drinking 1 night prior to his admission and around 9 PM that night he lost balance and fell in his yard.  He developed severe pain in his left hip and has been unable to ambulate since then.  He is also unable to bear weight on the lower extremity. Unable to do review of systems on this patient due to his mental status Labs reveal sodium 131, potassium 3.6, chloride 93, bicarb 21, BUN 14, creatinine 0.89, calcium 8.9, AST 104, ALT 43, white count 26.8, hemoglobin 13.2, hematocrit 38.3, MCV 91.4, RDW 14.6, platelet count 283 Chest x-ray reviewed by me shows chest x-ray reviewed by me shows no obvious infiltrate or effusion. Left hip x-ray shows comminuted intertrochanteric fracture of the left proximal femur with varus deformity and apex anterior angulation with foreshortening.  Signs of prior trauma to the left femur with healed left femoral fracture and evidence of previous intramedullary nailing, hardware not in place currently. Left knee x-ray shows no abnormality Twelve-lead EKG reviewed by me shows normal sinus rhythm with nonspecific T wave abnormality COVID-19 PCR test is positive.    ED Course: Patient is a 74 year old male who presents to the ER for evaluation of left hip pain following a fall 1 day prior to his presentation.  Patient is found to have a comminuted intertrochanteric fracture of the proximal femur.  He will be  admitted to the hospital for further evaluation.  Review of Systems: As per HPI otherwise 10 point review of systems negative.    Past Medical History:  Diagnosis Date  . Diabetes mellitus without complication ([place])   . GERD (gastroesophageal reflux disease)   . Hypertension     Past Surgical History:  Procedure Laterality Date  . COLONOSCOPY WITH PROPOFOL N/A 07/21/2019   Procedure: COLONOSCOPY WITH PROPOFOL;  Surgeon: Jonathon Bellows, MD;  Location: [facility] ENDOSCOPY;  Service: Gastroenterology;  Laterality: N/A;  . ESOPHAGOGASTRODUODENOSCOPY (EGD) WITH PROPOFOL N/A 03/11/2020   Procedure: ESOPHAGOGASTRODUODENOSCOPY (EGD) WITH PROPOFOL;  Surgeon: Jonathon Bellows, MD;  Location: [facility] ENDOSCOPY;  Service: Gastroenterology;  Laterality: N/A;  . ESOPHAGOGASTRODUODENOSCOPY (EGD) WITH PROPOFOL N/A 04/11/2020   Procedure: ESOPHAGOGASTRODUODENOSCOPY (EGD) WITH PROPOFOL;  Surgeon: Jonathon Bellows, MD;  Location: [facility] ENDOSCOPY;  Service: Gastroenterology;  Laterality: N/A;  . EYE SURGERY    . FEMUR FRACTURE SURGERY Left   . FRACTURE SURGERY       reports that he has been smoking cigarettes. He has a 53.00 pack-year smoking history. He has never used smokeless tobacco. He reports current alcohol use. He reports that he does not use drugs.  No Known Allergies  Family History  Problem Relation Age of Onset  . Breast cancer Sister   . Ovarian cancer Sister      Prior to Admission medications   Medication Sig Start Date End Date Taking? Authorizing Provider  albuterol (VENTOLIN HFA) 108 (90 Base) MCG/ACT inhaler Inhale 2 puffs into the lungs every 4 (four) hours as  needed for wheezing or shortness of breath. 02/12/20   Duffy Bruce, MD  atenolol-chlorthalidone (TENORETIC) 100-25 MG tablet Take 1 tablet by mouth once daily 02/02/20   Cletis Athens, MD  atorvastatin (LIPITOR) 20 MG tablet Take 20 mg by mouth daily. 11/14/19   [provider]  metFORMIN (GLUCOPHAGE) 500 MG tablet Take 1 tablet by  mouth once daily 05/01/20   Cletis Athens, MD  omeprazole (PRILOSEC) 40 MG capsule Take 1 capsule (40 mg total) by mouth 2 (two) times daily before a meal. 03/13/20   Jonathon Bellows, MD    Physical Exam: Vitals:   05/02/20 0716 05/02/20 0907 05/02/20 0930  BP: (!) 105/47 118/80 92/62  Pulse: 78 60   Resp: 18 18 (!) 21  Temp: 97.8 F (36.6 C) 98 F (36.7 C)   TempSrc: Oral Oral   SpO2: 95% 98%   Weight: 56.7 kg    Height: 5\' 9"  (1.753 m)       Vitals:   05/02/20 0716 05/02/20 0907 05/02/20 0930  BP: (!) 105/47 118/80 92/62  Pulse: 78 60   Resp: 18 18 (!) 21  Temp: 97.8 F (36.6 C) 98 F (36.7 C)   TempSrc: Oral Oral   SpO2: 95% 98%   Weight: 56.7 kg    Height: 5\' 9"  (1.753 m)      Constitutional: NAD, alert and oriented only to person Eyes: PERRL, lids and conjunctivae normal ENMT: Mucous membranes are moist.  Neck: normal, supple, no masses, no thyromegaly Respiratory: clear to auscultation bilaterally, no wheezing, no crackles. Normal respiratory effort. No accessory muscle use.  Cardiovascular: Regular rate and rhythm, no murmurs / rubs / gallops. No extremity edema. 2+ pedal pulses. No carotid bruits.  Abdomen: no tenderness, no masses palpated. No hepatosplenomegaly. Bowel sounds positive.  Musculoskeletal: no clubbing / cyanosis. Decreased ROM left hip Skin: no rashes, lesions, ulcers.  Neurologic: Able to move all extremities, can follow simple commands, fine tremors in his hands Psychiatric: Has visual hallucinations   Labs on Admission: I have personally reviewed following labs and imaging studies  CBC: Recent Labs  Lab 05/02/20 0800  WBC 26.8*  HGB 13.2  HCT 38.3*  MCV 91.4  PLT 540   Basic Metabolic Panel: Recent Labs  Lab 05/02/20 0800  NA 131*  K 3.6  CL 93*  CO2 21*  GLUCOSE 146*  BUN 14  CREATININE 0.89  CALCIUM 8.9   GFR: Estimated Creatinine Clearance: 58.4 mL/min (by C-G formula based on SCr of 0.89 mg/dL). Liver Function  Tests: Recent Labs  Lab 05/02/20 0800  AST 104*  ALT 43  ALKPHOS 41  BILITOT 1.4*  PROT 6.8  ALBUMIN 3.7   No results for input(s): LIPASE, AMYLASE in the last 168 hours. No results for input(s): AMMONIA in the last 168 hours. Coagulation Profile: Recent Labs  Lab 05/02/20 0800  INR 1.2   Cardiac Enzymes: No results for input(s): CKTOTAL, CKMB, CKMBINDEX, TROPONINI in the last 168 hours. BNP (last 3 results) No results for input(s): PROBNP in the last 8760 hours. HbA1C: No results for input(s): HGBA1C in the last 72 hours. CBG: No results for input(s): GLUCAP in the last 168 hours. Lipid Profile: No results for input(s): CHOL, HDL, LDLCALC, TRIG, CHOLHDL, LDLDIRECT in the last 72 hours. Thyroid Function Tests: No results for input(s): TSH, T4TOTAL, FREET4, T3FREE, THYROIDAB in the last 72 hours. Anemia Panel: No results for input(s): VITAMINB12, FOLATE, FERRITIN, TIBC, IRON, RETICCTPCT in the last 72 hours. Urine analysis:  Component Value Date/Time   COLORURINE AMBER (A) 12/26/2019 2054   APPEARANCEUR CLEAR (A) 12/26/2019 2054   APPEARANCEUR Clear 02/08/2013 1549   LABSPEC 1.004 (L) 12/26/2019 2054   LABSPEC 1.015 02/08/2013 1549   PHURINE 6.0 12/26/2019 2054   GLUCOSEU NEGATIVE 12/26/2019 2054   GLUCOSEU Negative 02/08/2013 1549   HGBUR NEGATIVE 12/26/2019 2054   BILIRUBINUR NEGATIVE 12/26/2019 2054   BILIRUBINUR Negative 02/08/2013 [place] 12/26/2019 2054   PROTEINUR NEGATIVE 12/26/2019 2054   NITRITE POSITIVE (A) 12/26/2019 2054   LEUKOCYTESUR NEGATIVE 12/26/2019 2054   LEUKOCYTESUR Negative 02/08/2013 1549    Radiological Exams on Admission: DG Chest 1 View  Result Date: 05/02/2020 CLINICAL DATA:  Fall, pain to LEFT knee. EXAM: CHEST  1 VIEW COMPARISON:  02/12/2020 FINDINGS: Cardiomediastinal contours and hilar structures are normal. No consolidation.  No pleural effusion. Nodular opacity projecting over the LEFT upper lobe, over the  inferior border of the scapula. Findings may represent confluence of shadows are small LEFT upper lobe nodule. No effusion. No acute skeletal process on limited assessment. IMPRESSION: 1. No acute cardiopulmonary disease. 2. Nodular opacity projecting over the LEFT upper lobe may represent confluence of shadows or small LEFT upper lobe nodule. Consider follow-up PA and lateral chest radiograph for further evaluation when the patient is able. Electronically Signed   By: Zetta Bills M.D.   On: 05/02/2020 08:11   DG Knee 1-2 Views Left  Result Date: 05/02/2020 CLINICAL DATA:  Trauma EXAM: LEFT KNEE - 1-2 VIEW COMPARISON:  None. FINDINGS: Physiologic alignment. No fracture. Tricompartmental joint space loss, subchondral sclerosis and osteophytosis predominantly involving the medial compartment. No joint effusion. Vascular calcifications. IMPRESSION: No acute osseous abnormality. Moderate medial predominant osteoarthrosis. Electronically Signed   By: Primitivo Gauze M.D.   On: 05/02/2020 08:04   DG Hip Unilat W or Wo Pelvis 2-3 Views Left  Result Date: 05/02/2020 CLINICAL DATA:  Fall, pain in LEFT hip and knee with positive shortening of the LEFT lower extremity EXAM: DG HIP (WITH OR WITHOUT PELVIS) 2-3V LEFT COMPARISON:  CT abdomen and pelvis of 2014 FINDINGS: Comminuted inter trochanteric fracture of the LEFT proximal femur with varus deformity and foreshortening. Signs of prior trauma to the LEFT femur. Previous imaging shows evidence of prior intramedullary nailing and there is expansion of the cortex and signs of prior fracture to the proximal femoral diaphysis. No fracture of the bony pelvis. RIGHT hip appears located on single view. Cross-table lateral shows apex anterior angulation at the site of LEFT femoral fracture. IMPRESSION: 1. Comminuted intertrochanteric fracture of the LEFT proximal femur with varus deformity and apex anterior angulation with foreshortening. 2. Signs of prior trauma to the  LEFT femur with healed LEFT femoral fracture and evidence of previous intramedullary nailing, hardware not in place currently. Areas incompletely imaged on the current study. Electronically Signed   By: Zetta Bills M.D.   On: 05/02/2020 08:08   DG Hip Unilat W or Wo Pelvis 2-3 Views Right  Result Date: 05/02/2020 CLINICAL DATA:  Fall, left hip pain EXAM: DG HIP (WITH OR WITHOUT PELVIS) 2-3V RIGHT COMPARISON:  None. FINDINGS: There is a comminuted fracture through the left femoral intertrochanteric region with varus angulation. No subluxation or dislocation. No acute bony abnormality within the right hip. Hip joints and SI joints are symmetric. IMPRESSION: Left femoral intertrochanteric fracture with varus angulation and comminution. Electronically Signed   By: Rolm Baptise M.D.   On: 05/02/2020 10:39   DG Femur Min 2  Views Left  Result Date: 05/02/2020 CLINICAL DATA:  Fall, pain EXAM: LEFT FEMUR 2 VIEWS COMPARISON:  None. FINDINGS: There is a left femoral intertrochanteric fracture. Mildly comminuted with varus angulation. No subluxation or dislocation. Deformity in the mid femur likely related to old healed fracture. IMPRESSION: Left intertrochanteric fracture with varus angulation. Electronically Signed   By: Rolm Baptise M.D.   On: 05/02/2020 10:41    EKG: Independently reviewed.  Normal sinus rhythm Nonspecific T wave changes    Assessment/Plan Principal Problem:   Intertrochanteric fracture of left femur (HCC) Active Problems:   Anxiety   Hypertension   Alcohol abuse   Leukocytosis   Fall   COVID-19 virus detected   Intertrochanteric fracture of left hip ([place])   Closed displaced intertrochanteric fracture of left femur (HCC)   Alcohol use with withdrawal delirium      Intertrochanteric fracture of left femur Patient is status post fall and presents to the ER for evaluation of pain in his left hip and inability to bear weight on his left lower extremity Left hip x-ray shows  comminuted intertrochanteric fracture of the LEFT proximal femur with varus deformity and apex anterior angulation with Foreshortening. Signs of prior trauma to the LEFT femur with healed LEFT femoral fracture and evidence of previous intramedullary nailing, hardware not in place currently. Immobilize left lower extremity Pain control and muscle relaxants We will request orthopedic consult Place patient on fall precautions   Alcohol withdrawal delirium Patient is oriented only to person and has visual hallucinations Administer lorazepam for CIWA score of 8 or greater Supportive care with IV thiamine, folic acid and multivitamin Patient is currently requiring a safety sitter to reduce his risk for falls since he keeps trying to get out of bed   Leukocytosis Unclear etiology may be stress related Chest x-ray is negative for any infiltrate We will obtain urine analysis Monitor patient closely for signs suggestive of an infection like fever etc     COVID-19 virus detected Patient was screened in the emergency room and his COVID-19 PCR test is positive He is asymptomatic Discussed with patient's wife Ms Josias Tomerlin who consents for him to receive monoclonal antibodies   Hypertension Continue atenolol as blood pressure tolerates   Diabetes mellitus Hold Metformin Place patient on dextrose containing fluids Sliding scale coverage with insulin  DVT prophylaxis: SCD Code Status: Full Family Communication: Greater than 50% of time was spent discussing plan of care with patient at. All questions and concerns have been addressed. He verbalizes understanding and agrees with the plan Disposition Plan: Back to previous home environment Consults called: Orthopedics    Braun Rocca MD [facility]     05/02/2020, 12:07 PM

## 2020-05-02 NOTE — ED Notes (Signed)
Verbal consent obtained over phone from pt's wife for Mab infusion. This RN and Mechele Claude signed paper consent and placed in chart. Fact sheet given to patient.

## 2020-05-02 NOTE — ED Notes (Signed)
Called 2A to notify that pt will need telesitter.

## 2020-05-02 NOTE — ED Provider Notes (Addendum)
[facility] Emergency Department Provider Note   ____________________________________________    I have reviewed the triage vital signs and the nursing notes.   HISTORY  Chief Complaint Fall and Hip Pain     HPI Derrick Jones is a 74 y.o. male with history of diabetes presents with complaint of pain to his left hip and difficulty walking.  Patient reports that he was drinking alcohol last night around 9 PM lost his balance and fell in his yard.  Complains of severe pain to his left hip.  Denies other injuries.  No head injury.  Not on blood thinners.  No abdominal pain.  No nausea or vomiting.  No fevers or chills.  Review of records demonstrates a history of left hip fracture in the past.  Past Medical History:  Diagnosis Date  . Diabetes mellitus without complication ([place])   . GERD (gastroesophageal reflux disease)   . Hypertension     Patient Active Problem List   Diagnosis Date Noted  . Intertrochanteric fracture of left femur ([place]) 05/02/2020  . Leukocytosis 05/02/2020  . Fall 05/02/2020  . COVID-19 virus detected 05/02/2020  . Intertrochanteric fracture of left hip ([place]) 05/02/2020  . Closed displaced intertrochanteric fracture of left femur ([place]) 05/02/2020  . Alcohol use with withdrawal delirium 05/02/2020  . Alcohol abuse 02/27/2020  . Concussion wth loss of consciousness of 30 minutes or less 02/27/2020  . Alcoholic ketoacidosis 02/54/2706  . History of alcohol abuse 08/02/2017  . Hypercholesterolemia 08/02/2017  . Heartburn 07/02/2017  . Prediabetes 05/27/2017  . Adjustment disorder with mixed anxiety and depressed mood 05/11/2017  . Anxiety 05/11/2017  . Hypertension 05/11/2017  . Insomnia 05/11/2017  . History of gastric ulcer 05/11/2017  . History of esophageal stricture 05/11/2017    Past Surgical History:  Procedure Laterality Date  . COLONOSCOPY WITH PROPOFOL N/A 07/21/2019   Procedure: COLONOSCOPY WITH PROPOFOL;   Surgeon: Jonathon Bellows, MD;  Location: [facility] ENDOSCOPY;  Service: Gastroenterology;  Laterality: N/A;  . ESOPHAGOGASTRODUODENOSCOPY (EGD) WITH PROPOFOL N/A 03/11/2020   Procedure: ESOPHAGOGASTRODUODENOSCOPY (EGD) WITH PROPOFOL;  Surgeon: Jonathon Bellows, MD;  Location: [facility] ENDOSCOPY;  Service: Gastroenterology;  Laterality: N/A;  . ESOPHAGOGASTRODUODENOSCOPY (EGD) WITH PROPOFOL N/A 04/11/2020   Procedure: ESOPHAGOGASTRODUODENOSCOPY (EGD) WITH PROPOFOL;  Surgeon: Jonathon Bellows, MD;  Location: [facility] ENDOSCOPY;  Service: Gastroenterology;  Laterality: N/A;  . EYE SURGERY    . FEMUR FRACTURE SURGERY Left   . FRACTURE SURGERY      Prior to Admission medications   Medication Sig Start Date End Date Taking? Authorizing Provider  atenolol (TENORMIN) 100 MG tablet Take 100 mg by mouth daily. 02/06/20  Yes [provider]  atorvastatin (LIPITOR) 20 MG tablet Take 20 mg by mouth daily. 11/14/19  Yes [provider]  melatonin 3 MG TABS tablet Take 3 mg by mouth at bedtime.   Yes [provider]  metFORMIN (GLUCOPHAGE) 500 MG tablet Take 1 tablet by mouth once daily 05/01/20  Yes Masoud, Viann Shove, MD  omeprazole (PRILOSEC) 20 MG capsule Take 20 mg by mouth daily.   Yes [provider]     Allergies Patient has no known allergies.  Family History  Problem Relation Age of Onset  . Breast cancer Sister   . Ovarian cancer Sister     Social History Social History   Tobacco Use  . Smoking status: Current Every Day Smoker    Packs/day: 1.00    Years: 53.00    Pack years: 53.00  Types: Cigarettes  . Smokeless tobacco: Never Used  Vaping Use  . Vaping Use: Never used  Substance Use Topics  . Alcohol use: Yes  . Drug use: No    Review of Systems  Constitutional: No fever/chills Eyes: No visual changes.  ENT: No sore throat. Cardiovascular: Denies chest pain. Respiratory: Denies shortness of breath. Gastrointestinal: No abdominal pain.  No nausea, no vomiting.     Genitourinary: Negative for dysuria. Musculoskeletal: As above Skin: Negative for rash. Neurological: Negative for headaches   ____________________________________________   PHYSICAL EXAM:  VITAL SIGNS: ED Triage Vitals [05/02/20 0716]  Enc Vitals Group     BP (!) 105/47     Pulse Rate 78     Resp 18     Temp 97.8 F (36.6 C)     Temp Source Oral     SpO2 95 %     Weight 56.7 kg (125 lb)     Height 1.753 m (5\' 9" )     Head Circumference      Peak Flow      Pain Score 8     Pain Loc      Pain Edu?      Excl. in [place]?     Constitutional: Alert and oriented.  Eyes: Conjunctivae are normal.  Head: Atraumatic. Nose: No congestion/rhinnorhea. Mouth/Throat: Mucous membranes are moist.   Neck:  Painless ROM, no vertebral tenderness palpation Cardiovascular: Normal rate, regular rhythm. Grossly normal heart sounds.  Good peripheral circulation.  No chest wall tenderness to palpation Respiratory: Normal respiratory effort.  No retractions. Lungs CTAB. Gastrointestinal: Soft and nontender. No distention.  No CVA tenderness.  Musculoskeletal: Left leg shortened and externally rotated, warm and well-perfused distally, normal pulses. Neurologic:  Normal speech and language. No gross focal neurologic deficits are appreciated.  Skin:  Skin is warm, dry and intact. No rash noted. Psychiatric: Mood and affect are normal. Speech and behavior are normal.  ____________________________________________   LABS (all labs ordered are listed, but only abnormal results are displayed)  Labs Reviewed  SARS CORONAVIRUS 2 BY RT PCR (HOSPITAL ORDER, [place] [facility]) - Abnormal; Notable for the following components:      Result Value   SARS Coronavirus 2 POSITIVE (*)    All other components within normal limits  CBC - Abnormal; Notable for the following components:   WBC 26.8 (*)    RBC 4.19 (*)    HCT 38.3 (*)    All other components within normal limits   COMPREHENSIVE METABOLIC PANEL - Abnormal; Notable for the following components:   Sodium 131 (*)    Chloride 93 (*)    CO2 21 (*)    Glucose, Bld 146 (*)    AST 104 (*)    Total Bilirubin 1.4 (*)    Anion gap 17 (*)    All other components within normal limits  APTT  PROTIME-INR  GLUCOSE, CAPILLARY  URINALYSIS, COMPLETE (UACMP) WITH MICROSCOPIC  URINALYSIS, ROUTINE W REFLEX MICROSCOPIC  HEMOGLOBIN A1C   ____________________________________________  EKG  ED ECG REPORT I, Lavonia Drafts, the attending physician, personally viewed and interpreted this ECG.  Date: 05/02/2020  Rhythm: normal sinus rhythm QRS Axis: normal Intervals: QTC mildly prolonged ST/T Wave abnormalities: normal Narrative Interpretation: no evidence of acute ischemia  ____________________________________________  RADIOLOGY  Hip x-ray reviewed by me, left comminuted intertrochanteric hip fracture Chest x-ray viewed by me, no infiltrate effusion or pneumothorax ____________________________________________   PROCEDURES  Procedure(s) performed: No  Procedures  Critical Care performed: No ____________________________________________   INITIAL IMPRESSION / ASSESSMENT AND PLAN / ED COURSE  Pertinent labs & imaging results that were available during my care of the patient were reviewed by me and considered in my medical decision making (see chart for details).  Patient presents with mechanical fall related alcohol use as noted above.  Left hip is shortened and externally rotated, highly suspicious for hip fracture.  No evidence of head trauma, no vertebral tenderness palpation, no abdominal tenderness or chest wall tenderness.  Not on blood thinners.  Pending x-rays of the hip and left knee  Hip x-ray reviewed by me, demonstrates left intertrochanteric comminuted fracture  Patient saved IV fentanyl and IV Zofran  Labs notable for elevated white blood cell count likely related to hip  fracture times nearly 12 hours, no concern for infectious etiology or sepsis at this time.  Have consulted orthopedics and hospitalist for admission  Patient's Covid test has returned positive, notified hospitalist    ____________________________________________   FINAL CLINICAL IMPRESSION(S) / ED DIAGNOSES  Final diagnoses:  Closed fracture of left hip, initial encounter ([place])  COVID-19        Note:  This document was prepared using Dragon voice recognition software and may include unintentional dictation errors.   Lavonia Drafts, MD 05/02/20 0900    Lavonia Drafts, MD 05/02/20 1255

## 2020-05-02 NOTE — ED Notes (Signed)
Per Charge nurse Raquel no staff able to switch in as 1:1 sitter at shift change so this RN switching pt to telesitter.

## 2020-05-02 NOTE — Consult Note (Signed)
ORTHOPAEDIC CONSULTATION  REQUESTING PHYSICIAN: Collier Bullock, MD  Chief Complaint:   L hip pain  History of Present Illness: Derrick Jones is a 74 y.o. male who was drinking alcohol last night and had a fall.  He does have a history of alcohol abuse and has been experiencing withdrawal symptoms. The patient noted immediate hip pain and inability to ambulate.  The patient ambulates with a walker most of the time. He lives at home with his wife. Per wife, he drinks alcohol on the weekends. He does smoke.  Pain is described as sharp at its worst and a dull ache at its best.  Pain is rated a 10 out of 10 in severity.  Pain is improved with rest and immobilization.  Pain is worse with any sort of movement.  X-rays in the emergency department show a left intertrochanteric hip fracture.  Past Medical History:  Diagnosis Date  . Diabetes mellitus without complication ([place])   . GERD (gastroesophageal reflux disease)   . Hypertension    Past Surgical History:  Procedure Laterality Date  . COLONOSCOPY WITH PROPOFOL N/A 07/21/2019   Procedure: COLONOSCOPY WITH PROPOFOL;  Surgeon: Jonathon Bellows, MD;  Location: [facility] ENDOSCOPY;  Service: Gastroenterology;  Laterality: N/A;  . ESOPHAGOGASTRODUODENOSCOPY (EGD) WITH PROPOFOL N/A 03/11/2020   Procedure: ESOPHAGOGASTRODUODENOSCOPY (EGD) WITH PROPOFOL;  Surgeon: Jonathon Bellows, MD;  Location: [facility] ENDOSCOPY;  Service: Gastroenterology;  Laterality: N/A;  . ESOPHAGOGASTRODUODENOSCOPY (EGD) WITH PROPOFOL N/A 04/11/2020   Procedure: ESOPHAGOGASTRODUODENOSCOPY (EGD) WITH PROPOFOL;  Surgeon: Jonathon Bellows, MD;  Location: [facility] ENDOSCOPY;  Service: Gastroenterology;  Laterality: N/A;  . EYE SURGERY    . FEMUR FRACTURE SURGERY Left   . FRACTURE SURGERY     Social History   Socioeconomic History  . Marital status: Married    Spouse name: Not on file  . Number of children: Not on file  . Years of education:  Not on file  . Highest education level: Not on file  Occupational History  . Not on file  Tobacco Use  . Smoking status: Current Every Day Smoker    Packs/day: 1.00    Years: 53.00    Pack years: 53.00    Types: Cigarettes  . Smokeless tobacco: Never Used  Vaping Use  . Vaping Use: Never used  Substance and Sexual Activity  . Alcohol use: Yes  . Drug use: No  . Sexual activity: Not on file  Other Topics Concern  . Not on file  Social History Narrative  . Not on file   Social Determinants of Health   Financial Resource Strain:   . Difficulty of Paying Living Expenses: Not on file  Food Insecurity:   . Worried About Charity fundraiser in the Last Year: Not on file  . Ran Out of Food in the Last Year: Not on file  Transportation Needs:   . Lack of Transportation (Medical): Not on file  . Lack of Transportation (Non-Medical): Not on file  Physical Activity:   . Days of Exercise per Week: Not on file  . Minutes of Exercise per Session: Not on file  Stress:   . Feeling of Stress : Not on file  Social Connections:   . Frequency of Communication with Friends and Family: Not on file  . Frequency of Social Gatherings with Friends and Family: Not on file  . Attends Religious Services: Not on file  . Active Member of Clubs or Organizations: Not on file  . Attends Archivist Meetings: Not  on file  . Marital Status: Not on file   Family History  Problem Relation Age of Onset  . Breast cancer Sister   . Ovarian cancer Sister    No Known Allergies Prior to Admission medications   Medication Sig Start Date End Date Taking? Authorizing Provider  atenolol (TENORMIN) 100 MG tablet Take 100 mg by mouth daily. 02/06/20  Yes [provider]  atorvastatin (LIPITOR) 20 MG tablet Take 20 mg by mouth daily. 11/14/19  Yes [provider]  melatonin 3 MG TABS tablet Take 3 mg by mouth at bedtime.   Yes [provider]  metFORMIN (GLUCOPHAGE) 500 MG  tablet Take 1 tablet by mouth once daily 05/01/20  Yes Masoud, Viann Shove, MD  omeprazole (PRILOSEC) 20 MG capsule Take 20 mg by mouth daily.   Yes [provider]   Recent Labs    05/02/20 0800  WBC 26.8*  HGB 13.2  HCT 38.3*  PLT 283  K 3.6  CL 93*  CO2 21*  BUN 14  CREATININE 0.89  GLUCOSE 146*  CALCIUM 8.9  INR 1.2   DG Chest 1 View  Result Date: 05/02/2020 CLINICAL DATA:  Fall, pain to LEFT knee. EXAM: CHEST  1 VIEW COMPARISON:  02/12/2020 FINDINGS: Cardiomediastinal contours and hilar structures are normal. No consolidation.  No pleural effusion. Nodular opacity projecting over the LEFT upper lobe, over the inferior border of the scapula. Findings may represent confluence of shadows are small LEFT upper lobe nodule. No effusion. No acute skeletal process on limited assessment. IMPRESSION: 1. No acute cardiopulmonary disease. 2. Nodular opacity projecting over the LEFT upper lobe may represent confluence of shadows or small LEFT upper lobe nodule. Consider follow-up PA and lateral chest radiograph for further evaluation when the patient is able. Electronically Signed   By: Zetta Bills M.D.   On: 05/02/2020 08:11   DG Knee 1-2 Views Left  Result Date: 05/02/2020 CLINICAL DATA:  Trauma EXAM: LEFT KNEE - 1-2 VIEW COMPARISON:  None. FINDINGS: Physiologic alignment. No fracture. Tricompartmental joint space loss, subchondral sclerosis and osteophytosis predominantly involving the medial compartment. No joint effusion. Vascular calcifications. IMPRESSION: No acute osseous abnormality. Moderate medial predominant osteoarthrosis. Electronically Signed   By: Primitivo Gauze M.D.   On: 05/02/2020 08:04   DG Hip Unilat W or Wo Pelvis 2-3 Views Left  Result Date: 05/02/2020 CLINICAL DATA:  Fall, pain in LEFT hip and knee with positive shortening of the LEFT lower extremity EXAM: DG HIP (WITH OR WITHOUT PELVIS) 2-3V LEFT COMPARISON:  CT abdomen and pelvis of 2014 FINDINGS: Comminuted  inter trochanteric fracture of the LEFT proximal femur with varus deformity and foreshortening. Signs of prior trauma to the LEFT femur. Previous imaging shows evidence of prior intramedullary nailing and there is expansion of the cortex and signs of prior fracture to the proximal femoral diaphysis. No fracture of the bony pelvis. RIGHT hip appears located on single view. Cross-table lateral shows apex anterior angulation at the site of LEFT femoral fracture. IMPRESSION: 1. Comminuted intertrochanteric fracture of the LEFT proximal femur with varus deformity and apex anterior angulation with foreshortening. 2. Signs of prior trauma to the LEFT femur with healed LEFT femoral fracture and evidence of previous intramedullary nailing, hardware not in place currently. Areas incompletely imaged on the current study. Electronically Signed   By: Zetta Bills M.D.   On: 05/02/2020 08:08   DG Hip Unilat W or Wo Pelvis 2-3 Views Right  Result Date: 05/02/2020 CLINICAL DATA:  Fall, left hip pain EXAM: DG HIP (WITH OR WITHOUT PELVIS) 2-3V RIGHT COMPARISON:  None. FINDINGS: There is a comminuted fracture through the left femoral intertrochanteric region with varus angulation. No subluxation or dislocation. No acute bony abnormality within the right hip. Hip joints and SI joints are symmetric. IMPRESSION: Left femoral intertrochanteric fracture with varus angulation and comminution. Electronically Signed   By: Rolm Baptise M.D.   On: 05/02/2020 10:39   DG Femur Min 2 Views Left  Result Date: 05/02/2020 CLINICAL DATA:  Fall, pain EXAM: LEFT FEMUR 2 VIEWS COMPARISON:  None. FINDINGS: There is a left femoral intertrochanteric fracture. Mildly comminuted with varus angulation. No subluxation or dislocation. Deformity in the mid femur likely related to old healed fracture. IMPRESSION: Left intertrochanteric fracture with varus angulation. Electronically Signed   By: Rolm Baptise M.D.   On: 05/02/2020 10:41     Positive  ROS: All other systems have been reviewed and were otherwise negative with the exception of those mentioned in the HPI and as above.  Physical Exam: BP (!) 163/94   Pulse 87   Temp 98 F (36.7 C) (Oral)   Resp 20   Ht 5\' 9"  (1.753 m)   Wt 56.7 kg   SpO2 97%   BMI 18.46 kg/m  General:  Mildly drowsy, A&O x1 Psychiatric:  Patient is NOT competent for consent, non-agitated Cardiovascular:  No pedal edema, regular rate and rhythm Respiratory:  No wheezing, non-labored breathing GI:  Abdomen is soft and non-tender Skin:  No lesions in the area of chief complaint, no erythema Neurologic:  Sensation intact distally, CN grossly intact Lymphatic:  No axillary or cervical lymphadenopathy  Orthopedic Exam:  LLE: + DF/PF/EHL SILT grossly over foot Foot wwp +Log roll/axial load   X-rays:  As above: L intertrochanteric hip fracture in the setting of prior healed proximal femoral shaft fracture  Assessment/Plan: ROMIN DIVITA is a 74 y.o. male with a L intertrochanteric hip fracture in the setting of prior healed proximal femoral shaft fracture   1. I discussed the various treatment options including both surgical and non-surgical management of the fracture with the patient's family (medical PoA). We discussed the high risk of perioperative complications due to patient's age and other co-morbidities. After discussion of risks, benefits, and alternatives to surgery, the family and patient were in agreement to proceed with surgery. The goals of surgery would be to provide adequate pain relief and allow for mobilization. Plan for surgery is R hip cephalomedullary nailing tomorrow, 05/03/20. Consent obtained from patient's wife over the phone due to patient's withdrawal symptoms currently.  2. NPO after midnight 3. Hold anticoagulation in advance of OR 4. Admit to [place]   05/02/2020 12:40 PM;t

## 2020-05-03 ENCOUNTER — Inpatient Hospital Stay: Payer: PPO | Admitting: Anesthesiology

## 2020-05-03 ENCOUNTER — Inpatient Hospital Stay: Payer: PPO

## 2020-05-03 ENCOUNTER — Encounter
Admission: EM | Disposition: A | Discharge status: Skilled Nursing Facility | Payer: Self-pay | Source: Home / Self Care | Attending: Family Medicine

## 2020-05-03 DIAGNOSIS — S72002A Fracture of unspecified part of neck of left femur, initial encounter for closed fracture: Secondary | ICD-10-CM | POA: Diagnosis present

## 2020-05-03 HISTORY — PX: INTRAMEDULLARY (IM) NAIL INTERTROCHANTERIC: SHX5875

## 2020-05-03 LAB — CBC
HCT: 36.7 % — ABNORMAL LOW (ref 39.0–52.0)
Hemoglobin: 12.5 g/dL — ABNORMAL LOW (ref 13.0–17.0)
MCH: 31.6 pg (ref 26.0–34.0)
MCHC: 34.1 g/dL (ref 30.0–36.0)
MCV: 92.7 fL (ref 80.0–100.0)
Platelets: 226 10*3/uL (ref 150–400)
RBC: 3.96 MIL/uL — ABNORMAL LOW (ref 4.22–5.81)
RDW: 15 % (ref 11.5–15.5)
WBC: 18.4 10*3/uL — ABNORMAL HIGH (ref 4.0–10.5)
nRBC: 0 % (ref 0.0–0.2)

## 2020-05-03 LAB — GLUCOSE, CAPILLARY
Glucose-Capillary: 86 mg/dL (ref 70–99)
Glucose-Capillary: 93 mg/dL (ref 70–99)
Glucose-Capillary: 74 mg/dL (ref 70–99)
Glucose-Capillary: 107 mg/dL — ABNORMAL HIGH (ref 70–99)
Glucose-Capillary: 75 mg/dL (ref 70–99)
Glucose-Capillary: 83 mg/dL (ref 70–99)

## 2020-05-03 LAB — BASIC METABOLIC PANEL
Anion gap: 8 (ref 5–15)
BUN: 12 mg/dL (ref 8–23)
CO2: 28 mmol/L (ref 22–32)
Calcium: 8.3 mg/dL — ABNORMAL LOW (ref 8.9–10.3)
Chloride: 97 mmol/L — ABNORMAL LOW (ref 98–111)
Creatinine, Ser: 0.76 mg/dL (ref 0.61–1.24)
GFR calc Af Amer: >60 mL/min (ref >60)
GFR calc non Af Amer: >60 mL/min (ref >60)
Glucose, Bld: 95 mg/dL (ref 70–99)
Potassium: 3.6 mmol/L (ref 3.5–5.1)
Sodium: 133 mmol/L — ABNORMAL LOW (ref 135–145)

## 2020-05-03 SURGERY — FIXATION, FRACTURE, INTERTROCHANTERIC, WITH INTRAMEDULLARY ROD
Anesthesia: Spinal | Laterality: Left

## 2020-05-03 MED ORDER — ONDANSETRON HCL 4 MG/2ML IJ SOLN: 4.0000 mg | Freq: Four times a day (QID) | INTRAMUSCULAR | Status: DC | PRN

## 2020-05-03 MED ORDER — PROPOFOL 10 MG/ML IV BOLUS
INTRAVENOUS | Status: DC | PRN
  Administered 2020-05-03: 10 mg via INTRAVENOUS
  Administered 2020-05-03: 20 mg via INTRAVENOUS
  Administered 2020-05-03: 10 mg via INTRAVENOUS
  Administered 2020-05-03: 20 mg via INTRAVENOUS
  Administered 2020-05-03: 75 ug/kg/min via INTRAVENOUS

## 2020-05-03 MED ORDER — MIDAZOLAM HCL 5 MG/5ML IJ SOLN
INTRAMUSCULAR | Status: DC | PRN
  Administered 2020-05-03 (×2): 1 mg via INTRAVENOUS

## 2020-05-03 MED ORDER — BUPIVACAINE HCL (PF) 0.5 % IJ SOLN
INTRAMUSCULAR | Status: DC | PRN
  Administered 2020-05-03: 2.5 mL

## 2020-05-03 MED ORDER — BUPIVACAINE LIPOSOME 1.3 % IJ SUSP
INTRAMUSCULAR | Status: DC | PRN
  Administered 2020-05-03: 50 mL

## 2020-05-03 MED ORDER — OXYCODONE HCL 5 MG PO TABS
5.0000 mg | ORAL_TABLET | ORAL | Status: DC | PRN
  Administered 2020-05-04: 22:00:00 5 mg via ORAL
  Administered 2020-05-05 – 2020-05-07 (×6): 10 mg via ORAL
  Filled 2020-05-03 (×6): qty 2
  Filled 2020-05-03: qty 1

## 2020-05-03 MED ORDER — METHOCARBAMOL 1000 MG/10ML IJ SOLN
500.0000 mg | Freq: Four times a day (QID) | INTRAVENOUS | Status: DC | PRN
  Filled 2020-05-03: qty 5

## 2020-05-03 MED ORDER — FENTANYL CITRATE (PF) 100 MCG/2ML IJ SOLN
INTRAMUSCULAR | Status: DC | PRN
  Administered 2020-05-03 (×4): 25 ug via INTRAVENOUS

## 2020-05-03 MED ORDER — FENTANYL CITRATE (PF) 100 MCG/2ML IJ SOLN
INTRAMUSCULAR | Status: AC
  Filled 2020-05-03: qty 2

## 2020-05-03 MED ORDER — SODIUM CHLORIDE 0.9 % IV SOLN
1.0000 g | INTRAVENOUS | Status: DC
  Administered 2020-05-03 – 2020-05-07 (×5): 1 g via INTRAVENOUS
  Filled 2020-05-03: qty 10
  Filled 2020-05-03: qty 1
  Filled 2020-05-03: qty 10
  Filled 2020-05-03: qty 1
  Filled 2020-05-03: qty 10
  Filled 2020-05-03: qty 1

## 2020-05-03 MED ORDER — DOCUSATE SODIUM 100 MG PO CAPS
100.0000 mg | ORAL_CAPSULE | Freq: Two times a day (BID) | ORAL | Status: DC
  Administered 2020-05-03 – 2020-05-07 (×8): 100 mg via ORAL
  Filled 2020-05-03 (×8): qty 1

## 2020-05-03 MED ORDER — TRAMADOL HCL 50 MG PO TABS: 50.0000 mg | ORAL_TABLET | Freq: Four times a day (QID) | ORAL | Status: DC | PRN

## 2020-05-03 MED ORDER — METHOCARBAMOL 500 MG PO TABS
500.0000 mg | ORAL_TABLET | Freq: Four times a day (QID) | ORAL | Status: DC | PRN
  Administered 2020-05-06: 500 mg via ORAL
  Filled 2020-05-03 (×2): qty 1

## 2020-05-03 MED ORDER — HYDROMORPHONE HCL 1 MG/ML IJ SOLN: 0.2500 mg | INTRAMUSCULAR | Status: DC | PRN

## 2020-05-03 MED ORDER — CHLORHEXIDINE GLUCONATE CLOTH 2 % EX PADS
6.0000 | MEDICATED_PAD | Freq: Every day | CUTANEOUS | Status: DC
  Administered 2020-05-03: 18:00:00 6 via TOPICAL

## 2020-05-03 MED ORDER — SODIUM CHLORIDE 0.9 % IV SOLN: INTRAVENOUS | Status: DC

## 2020-05-03 MED ORDER — ACETAMINOPHEN 500 MG PO TABS
1000.0000 mg | ORAL_TABLET | Freq: Three times a day (TID) | ORAL | Status: AC
  Administered 2020-05-03 – 2020-05-04 (×4): 1000 mg via ORAL
  Filled 2020-05-03 (×4): qty 2

## 2020-05-03 MED ORDER — METOCLOPRAMIDE HCL 5 MG/ML IJ SOLN: 5.0000 mg | Freq: Three times a day (TID) | INTRAMUSCULAR | Status: DC | PRN

## 2020-05-03 MED ORDER — MIDAZOLAM HCL 2 MG/2ML IJ SOLN
INTRAMUSCULAR | Status: AC
  Filled 2020-05-03: qty 2

## 2020-05-03 MED ORDER — ENSURE ENLIVE PO LIQD
237.0000 mL | Freq: Three times a day (TID) | ORAL | Status: DC
  Administered 2020-05-04 – 2020-05-07 (×5): 237 mL via ORAL
  Filled 2020-05-03 (×2): qty 237

## 2020-05-03 MED ORDER — ONDANSETRON HCL 4 MG PO TABS: 4.0000 mg | ORAL_TABLET | Freq: Four times a day (QID) | ORAL | Status: DC | PRN

## 2020-05-03 MED ORDER — SENNOSIDES-DOCUSATE SODIUM 8.6-50 MG PO TABS: 1.0000 | ORAL_TABLET | Freq: Every evening | ORAL | Status: DC | PRN

## 2020-05-03 MED ORDER — PHENYLEPHRINE HCL (PRESSORS) 10 MG/ML IV SOLN
INTRAVENOUS | Status: DC | PRN
  Administered 2020-05-03 (×2): 100 ug via INTRAVENOUS

## 2020-05-03 MED ORDER — SODIUM CHLORIDE (PF) 0.9 % IJ SOLN
INTRAMUSCULAR | Status: AC
  Filled 2020-05-03: qty 50

## 2020-05-03 MED ORDER — METOCLOPRAMIDE HCL 5 MG PO TABS: 5.0000 mg | ORAL_TABLET | Freq: Three times a day (TID) | ORAL | Status: DC | PRN

## 2020-05-03 MED ORDER — ENOXAPARIN SODIUM 40 MG/0.4ML ~~LOC~~ SOLN
40.0000 mg | SUBCUTANEOUS | Status: DC
  Administered 2020-05-04 – 2020-05-07 (×4): 40 mg via SUBCUTANEOUS
  Filled 2020-05-03 (×4): qty 0.4

## 2020-05-03 MED ORDER — SODIUM CHLORIDE 0.9 % IV SOLN
INTRAVENOUS | Status: DC | PRN
  Administered 2020-05-03: 50 ug/min via INTRAVENOUS

## 2020-05-03 MED ORDER — OXYCODONE HCL 5 MG PO TABS: 2.5000 mg | ORAL_TABLET | ORAL | Status: DC | PRN

## 2020-05-03 MED ORDER — PROPOFOL 500 MG/50ML IV EMUL
INTRAVENOUS | Status: AC
  Filled 2020-05-03: qty 100

## 2020-05-03 MED ORDER — BISACODYL 10 MG RE SUPP: 10.0000 mg | Freq: Every day | RECTAL | Status: DC | PRN

## 2020-05-03 MED ORDER — FLEET ENEMA 7-19 GM/118ML RE ENEM: 1.0000 | ENEMA | Freq: Once | RECTAL | Status: DC | PRN

## 2020-05-03 SURGICAL SUPPLY — 50 items
"PENCIL ELECTRO HAND CTR " (MISCELLANEOUS) ×1 IMPLANT
BIT DRILL 4.0X165 AO STYLE (BIT) ×2 IMPLANT
BIT DRILL 4.0X280 (BIT) ×2 IMPLANT
BIT DRILL 5.0 CALIBRATED STEP (BIT) ×4 IMPLANT
BLADE SURG 15 STRL LF DISP TIS (BLADE) ×1 IMPLANT
BLADE SURG 15 STRL SS (BLADE) ×3
CANISTER SUCT 1200ML W/VALVE (MISCELLANEOUS) ×3 IMPLANT
CHLORAPREP W/TINT 26 (MISCELLANEOUS) ×3 IMPLANT
COVER WAND RF STERILE (DRAPES) ×3 IMPLANT
DRAPE 3/4 80X56 (DRAPES) ×3 IMPLANT
DRAPE SURG 17X11 SM STRL (DRAPES) ×6 IMPLANT
DRAPE U-SHAPE 47X51 STRL (DRAPES) ×6 IMPLANT
DRSG OPSITE POSTOP 3X4 (GAUZE/BANDAGES/DRESSINGS) ×9 IMPLANT
ELECT REM PT RETURN 9FT ADLT (ELECTROSURGICAL) ×3
ELECTRODE REM PT RTRN 9FT ADLT (ELECTROSURGICAL) ×1 IMPLANT
GLOVE BIOGEL PI IND STRL 8 (GLOVE) ×1 IMPLANT
GLOVE BIOGEL PI INDICATOR 8 (GLOVE) ×2
GLOVE SURG SYN 7.5  E (GLOVE) ×3
GLOVE SURG SYN 7.5 E (GLOVE) ×1 IMPLANT
GLOVE SURG SYN 7.5 PF PI (GLOVE) ×1 IMPLANT
GOWN STRL REUS W/ TWL LRG LVL3 (GOWN DISPOSABLE) ×1 IMPLANT
GOWN STRL REUS W/ TWL XL LVL3 (GOWN DISPOSABLE) ×1 IMPLANT
GOWN STRL REUS W/TWL LRG LVL3 (GOWN DISPOSABLE) ×3
GOWN STRL REUS W/TWL XL LVL3 (GOWN DISPOSABLE) ×3
GUIDE PIN 3.2X330 (PIN) ×3
GUIDEWIRE BALL NOSE 3.0X900 (WIRE) ×2 IMPLANT
HOOD PEEL AWAY FLYTE STAYCOOL (MISCELLANEOUS) ×2 IMPLANT
KIT PATIENT CARE HANA TABLE (KITS) ×3 IMPLANT
KIT TURNOVER KIT A (KITS) ×3 IMPLANT
MAT ABSORB  FLUID 56X50 GRAY (MISCELLANEOUS) ×6
MAT ABSORB FLUID 56X50 GRAY (MISCELLANEOUS) ×2 IMPLANT
NAIL FEM LONG 11X130X39 (Nail) ×2 IMPLANT
NDL FILTER BLUNT 18X1 1/2 (NEEDLE) ×1 IMPLANT
NEEDLE FILTER BLUNT 18X 1/2SAF (NEEDLE) ×2
NEEDLE FILTER BLUNT 18X1 1/2 (NEEDLE) ×1 IMPLANT
NEEDLE HYPO 22GX1.5 SAFETY (NEEDLE) ×3 IMPLANT
NS IRRIG 1000ML POUR BTL (IV SOLUTION) ×3 IMPLANT
PACK HIP COMPR (MISCELLANEOUS) ×3 IMPLANT
PENCIL ELECTRO HAND CTR (MISCELLANEOUS) ×3 IMPLANT
PIN GUIDE 3.2X330 (PIN) IMPLANT
SCREW AR 5X75 (Screw) ×2 IMPLANT
SCREW LEFTY LAG 10.5X100 (Screw) ×2 IMPLANT
SCREW LOCK CORT 5.0X50 (Screw) ×2 IMPLANT
SCREW LOCK CORT 5X48 (Screw) ×2 IMPLANT
STAPLER SKIN PROX 35W (STAPLE) ×3 IMPLANT
SUT VIC AB 2-0 CT2 27 (SUTURE) ×3 IMPLANT
SYR 10ML LL (SYRINGE) ×3 IMPLANT
SYR 30ML LL (SYRINGE) ×3 IMPLANT
TAPE CLOTH 3X10 WHT NS LF (GAUZE/BANDAGES/DRESSINGS) ×6 IMPLANT
TOOL ACTIVATION (INSTRUMENTS) ×2 IMPLANT

## 2020-05-03 NOTE — ED Notes (Signed)
Pt's L dorsalis pedis pulse 2+; foot warm; appropriate color.

## 2020-05-03 NOTE — Plan of Care (Signed)

## 2020-05-03 NOTE — Anesthesia Postprocedure Evaluation (Signed)
Anesthesia Post Note  Patient: Derrick Jones  Procedure(s) Performed: INTRAMEDULLARY (IM) NAIL INTERTROCHANTRIC (Left )  Patient location during evaluation: PACU Anesthesia Type: Spinal Level of consciousness: awake and alert Pain management: pain level controlled Vital Signs Assessment: post-procedure vital signs reviewed and stable Respiratory status: spontaneous breathing, nonlabored ventilation, respiratory function stable and patient connected to nasal cannula oxygen Cardiovascular status: blood pressure returned to baseline and stable Postop Assessment: no apparent nausea or vomiting Anesthetic complications: no   No complications documented.   Last Vitals:  Vitals:   05/03/20 1634 05/03/20 1740  BP: (!) 146/119 111/87  Pulse:  98  Resp: (!) 8 16  Temp:  36.6 C  SpO2:  94%    Last Pain:  Vitals:   05/03/20 1740  TempSrc: Axillary  PainSc:                  [name]

## 2020-05-03 NOTE — Progress Notes (Signed)
PROGRESS NOTE    Derrick LOPEZGARCIA  GYF:[id number] DOB: 1946-06-30 DOA: 05/02/2020 PCP: Cletis Athens, MD   Brief Narrative:  HPI: Derrick Jones is a 74 y.o. male with medical history significant for alcohol abuse, GERD hypertension and anxiety disorder who presented to the ER for evaluation of pain in his left hip and difficulty with ambulation.  Patient had been drinking 1 night prior to his admission and around 9 PM that night he lost balance and fell in his yard.  He developed severe pain in his left hip and has been unable to ambulate since then.  He is also unable to bear weight on the lower extremity. Unable to do review of systems on this patient due to his mental status Labs reveal sodium 131, potassium 3.6, chloride 93, bicarb 21, BUN 14, creatinine 0.89, calcium 8.9, AST 104, ALT 43, white count 26.8, hemoglobin 13.2, hematocrit 38.3, MCV 91.4, RDW 14.6, platelet count 283 Chest x-ray reviewed by me shows chest x-ray reviewed by me shows no obvious infiltrate or effusion. Left hip x-ray shows comminuted intertrochanteric fracture of the left proximal femur with varus deformity and apex anterior angulation with foreshortening.  Signs of prior trauma to the left femur with healed left femoral fracture and evidence of previous intramedullary nailing, hardware not in place currently. Left knee x-ray shows no abnormality Twelve-lead EKG reviewed by me shows normal sinus rhythm with nonspecific T wave abnormality COVID-19 PCR test is positive.    ED Course: Patient is a 74 year old male who presents to the ER for evaluation of left hip pain following a fall 1 day prior to his presentation.  Patient is found to have a comminuted intertrochanteric fracture of the proximal femur.  He will be admitted to the hospital for further evaluation.  Assessment & Plan:   Principal Problem:   Intertrochanteric fracture of left femur (HCC) Active Problems:   Anxiety   Hypertension   Alcohol abuse    Leukocytosis   Fall   COVID-19 virus detected   Intertrochanteric fracture of left hip (HCC)   Closed displaced intertrochanteric fracture of left femur (HCC)   Alcohol use with withdrawal delirium   Closed left hip fracture (HCC)   Intertrochanteric fracture of left femur: Orthopedics on board.  Plan for surgery today.  Management per them.  Acute alcohol withdrawal delirium patient in hand mittens.  Still has significant upper extremity gross tremors.  Still seems to be withdrawing significantly.  We will continue CIWA protocol with as needed Ativan.  Will see how he does postoperatively, may need to be started on Librium scheduled.  Continue multivitamins.  Leukocytosis/possible UTI: Unable to tell me any symptoms due to his disorientation.  Chest x-ray clear but UA shows positive nitrites.  Highly suggest UTI in a male patient.  Will start on Rocephin.  Follow fever curve and CBC.  Will send urine for culture.  COVID-19 virus detected, incidentally: Patient was screened in the emergency room and his COVID-19 PCR test is positive He is asymptomatic.  Lungs clear to auscultation.  Chest x-ray clear. Previous admitting hospitalist discussed with patient's wife Ms Aarian Jones who consents for him to receive monoclonal antibodies.  He received 1 dose of antibodies already.  He is on 2 L oxygen just for comfort reason.  I have advised RN to take that off.  Essential hypertension: Blood pressure controlled. Continue atenolol as blood pressure tolerates  Diabetes mellitus type II: Hold Metformin.  Continue SSI.  DVT prophylaxis: SCDs Start:  05/02/20 0905   Code Status: Full Code  Family Communication:  None present at bedside.  Called and updated patient's wife.  She was crying only due to the fact that she cannot see him and they have never been a part before.  I reassured her that patient is being taken care of.  She was thankful.  Status is: Inpatient  Remains inpatient appropriate  because:Inpatient level of care appropriate due to severity of illness   Dispo: The patient is from: Home              Anticipated d/c is to: Home              Anticipated d/c date is: > 3 days              Patient currently is not medically stable to d/c.        Estimated body mass index is 18.46 kg/m as calculated from the following:   Height as of this encounter: 5\' 9"  (1.753 m).   Weight as of this encounter: 56.7 kg.      Nutritional status:               Consultants:   Orthopedics  Procedures:   None  Antimicrobials:  Anti-infectives (From admission, onward)   Start     Dose/Rate Route Frequency Ordered Stop   05/03/20 0615  ceFAZolin (ANCEF) IVPB 1 g/50 mL premix        1 g 100 mL/hr over 30 Minutes Intravenous  Once 05/02/20 1636           Subjective: Seen and examined in the ED.  Patient alert but incoherent.  Does not know where he is at.  Having significant withdrawals.  Objective: Vitals:   05/03/20 0430 05/03/20 0500 05/03/20 0530 05/03/20 0600  BP: 119/78 108/75 115/79 105/77  Pulse: 67 68 68 72  Resp: (!) 22  10 11   Temp:      TempSrc:      SpO2: 99% 99% 99% 99%  Weight:      Height:        Intake/Output Summary (Last 24 hours) at 05/03/2020 1026 Last data filed at 05/02/2020 2100 Gross per 24 hour  Intake 1000 ml  Output 100 ml  Net 900 ml   Filed Weights   05/02/20 0716  Weight: 56.7 kg    Examination:  General exam: Appears alert but confused and tremulous Respiratory system: Clear to auscultation. Respiratory effort normal. Cardiovascular system: S1 & S2 heard, RRR. No JVD, murmurs, rubs, gallops or clicks. No pedal edema. Gastrointestinal system: Abdomen is nondistended, soft and nontender. No organomegaly or masses felt. Normal bowel sounds heard. Central nervous system: Alert and oriented. No focal neurological deficits. Extremities: Symmetric 5 x 5 power. Skin: No rashes, lesions or ulcers   Data  Reviewed: I have personally reviewed following labs and imaging studies  CBC: Recent Labs  Lab 05/02/20 0800 05/03/20 0501  WBC 26.8* 18.4*  HGB 13.2 12.5*  HCT 38.3* 36.7*  MCV 91.4 92.7  PLT 283 630   Basic Metabolic Panel: Recent Labs  Lab 05/02/20 0800 05/03/20 0501  NA 131* 133*  K 3.6 3.6  CL 93* 97*  CO2 21* 28  GLUCOSE 146* 95  BUN 14 12  CREATININE 0.89 0.76  CALCIUM 8.9 8.3*   GFR: Estimated Creatinine Clearance: 65 mL/min (by C-G formula based on SCr of 0.76 mg/dL). Liver Function Tests: Recent Labs  Lab 05/02/20 0800  AST 104*  ALT 43  ALKPHOS 41  BILITOT 1.4*  PROT 6.8  ALBUMIN 3.7   No results for input(s): LIPASE, AMYLASE in the last 168 hours. No results for input(s): AMMONIA in the last 168 hours. Coagulation Profile: Recent Labs  Lab 05/02/20 0800  INR 1.2   Cardiac Enzymes: No results for input(s): CKTOTAL, CKMB, CKMBINDEX, TROPONINI in the last 168 hours. BNP (last 3 results) No results for input(s): PROBNP in the last 8760 hours. HbA1C: Recent Labs    05/02/20 1235  HGBA1C 6.4*   CBG: Recent Labs  Lab 05/02/20 1623 05/02/20 2106 05/02/20 2319 05/03/20 0458 05/03/20 0759  GLUCAP 117* 106* 129* 86 93   Lipid Profile: No results for input(s): CHOL, HDL, LDLCALC, TRIG, CHOLHDL, LDLDIRECT in the last 72 hours. Thyroid Function Tests: No results for input(s): TSH, T4TOTAL, FREET4, T3FREE, THYROIDAB in the last 72 hours. Anemia Panel: No results for input(s): VITAMINB12, FOLATE, FERRITIN, TIBC, IRON, RETICCTPCT in the last 72 hours. Sepsis Labs: No results for input(s): PROCALCITON, LATICACIDVEN in the last 168 hours.  Recent Results (from the past 240 hour(s))  SARS Coronavirus 2 by RT PCR (hospital order, performed in [facility] hospital lab) Nasopharyngeal Nasopharyngeal Swab     Status: Abnormal   Collection Time: 05/02/20  8:00 AM   Specimen: Nasopharyngeal Swab  Result Value Ref Range Status   SARS Coronavirus 2  POSITIVE (A) NEGATIVE Final    Comment: RESULT CALLED TO, READ BACK BY AND VERIFIED WITH: STEPHANIE RUDD AT 0867 ON 05/02/2020 [place]. (NOTE) SARS-CoV-2 target nucleic acids are DETECTED  SARS-CoV-2 RNA is generally detectable in upper respiratory specimens  during the acute phase of infection.  Positive results are indicative  of the presence of the identified virus, but do not rule out bacterial infection or co-infection with other pathogens not detected by the test.  Clinical correlation with patient history and  other diagnostic information is necessary to determine patient infection status.  The expected result is negative.  Fact Sheet for Patients:   StrictlyIdeas.no   Fact Sheet for Healthcare Providers:   BankingDealers.co.za    This test is not yet approved or cleared by the [place] FDA and  has been authorized for detection and/or diagnosis of SARS-CoV-2 by FDA under an Emergency Use Authorization (EUA).  This EUA will remain in effect (meaning t his test can be used) for the duration of  the COVID-19 declaration under Section 564(b)(1) of the Act, 21 U.S.C. section 360-bbb-3(b)(1), unless the authorization is terminated or revoked sooner.  Performed at [facility], [address] [place], [place] [address]       Radiology Studies: DG Chest 1 View  Result Date: 05/02/2020 CLINICAL DATA:  Fall, pain to LEFT knee. EXAM: CHEST  1 VIEW COMPARISON:  02/12/2020 FINDINGS: Cardiomediastinal contours and hilar structures are normal. No consolidation.  No pleural effusion. Nodular opacity projecting over the LEFT upper lobe, over the inferior border of the scapula. Findings may represent confluence of shadows are small LEFT upper lobe nodule. No effusion. No acute skeletal process on limited assessment. IMPRESSION: 1. No acute cardiopulmonary disease. 2. Nodular opacity projecting over the LEFT upper lobe may represent  confluence of shadows or small LEFT upper lobe nodule. Consider follow-up PA and lateral chest radiograph for further evaluation when the patient is able. Electronically Signed   By: Zetta Bills M.D.   On: 05/02/2020 08:11   DG Knee 1-2 Views Left  Result Date: 05/02/2020 CLINICAL DATA:  Trauma EXAM: LEFT  KNEE - 1-2 VIEW COMPARISON:  None. FINDINGS: Physiologic alignment. No fracture. Tricompartmental joint space loss, subchondral sclerosis and osteophytosis predominantly involving the medial compartment. No joint effusion. Vascular calcifications. IMPRESSION: No acute osseous abnormality. Moderate medial predominant osteoarthrosis. Electronically Signed   By: Primitivo Gauze M.D.   On: 05/02/2020 08:04   DG Hip Unilat W or Wo Pelvis 2-3 Views Left  Result Date: 05/02/2020 CLINICAL DATA:  Fall, pain in LEFT hip and knee with positive shortening of the LEFT lower extremity EXAM: DG HIP (WITH OR WITHOUT PELVIS) 2-3V LEFT COMPARISON:  CT abdomen and pelvis of 2014 FINDINGS: Comminuted inter trochanteric fracture of the LEFT proximal femur with varus deformity and foreshortening. Signs of prior trauma to the LEFT femur. Previous imaging shows evidence of prior intramedullary nailing and there is expansion of the cortex and signs of prior fracture to the proximal femoral diaphysis. No fracture of the bony pelvis. RIGHT hip appears located on single view. Cross-table lateral shows apex anterior angulation at the site of LEFT femoral fracture. IMPRESSION: 1. Comminuted intertrochanteric fracture of the LEFT proximal femur with varus deformity and apex anterior angulation with foreshortening. 2. Signs of prior trauma to the LEFT femur with healed LEFT femoral fracture and evidence of previous intramedullary nailing, hardware not in place currently. Areas incompletely imaged on the current study. Electronically Signed   By: Zetta Bills M.D.   On: 05/02/2020 08:08   DG Hip Unilat W or Wo Pelvis 2-3 Views  Right  Result Date: 05/02/2020 CLINICAL DATA:  Fall, left hip pain EXAM: DG HIP (WITH OR WITHOUT PELVIS) 2-3V RIGHT COMPARISON:  None. FINDINGS: There is a comminuted fracture through the left femoral intertrochanteric region with varus angulation. No subluxation or dislocation. No acute bony abnormality within the right hip. Hip joints and SI joints are symmetric. IMPRESSION: Left femoral intertrochanteric fracture with varus angulation and comminution. Electronically Signed   By: Rolm Baptise M.D.   On: 05/02/2020 10:39   DG Femur Min 2 Views Left  Result Date: 05/02/2020 CLINICAL DATA:  Fall, pain EXAM: LEFT FEMUR 2 VIEWS COMPARISON:  None. FINDINGS: There is a left femoral intertrochanteric fracture. Mildly comminuted with varus angulation. No subluxation or dislocation. Deformity in the mid femur likely related to old healed fracture. IMPRESSION: Left intertrochanteric fracture with varus angulation. Electronically Signed   By: Rolm Baptise M.D.   On: 05/02/2020 10:41    Scheduled Meds: . atenolol  100 mg Oral Daily  . atorvastatin  20 mg Oral Daily  . insulin aspart  0-9 Units Subcutaneous Q4H  . melatonin  2.5 mg Oral QHS  . multivitamin with minerals  1 tablet Oral Daily  . pantoprazole  40 mg Oral Daily  . sodium chloride flush  3 mL Intravenous Q12H  . thiamine  100 mg Oral Daily   Or  . thiamine  100 mg Intravenous Daily   Continuous Infusions: . sodium chloride    . sodium chloride    .  ceFAZolin (ANCEF) IV    . famotidine (PEPCID) IV    . methocarbamol (ROBAXIN) IV       LOS: 1 day   Time spent: 37 minutes   Darliss Cheney, MD [facility]  05/03/2020, 10:26 AM   To contact the attending provider between 7A-7P or the covering provider during after hours 7P-7A, please log into the web site [email or web address].

## 2020-05-03 NOTE — Progress Notes (Signed)
Called to update pt's wife Derrick Jones that he made it to the floor and is not in any pain. She was relieved. I asked her about his drinking and she said, "Really he just drinks on the weekends and it's just beer. Normally he's with it mentally but after he fell, he immediately seemed not himself and confused."  Currently, patient resting comfortably in bed arousable but too drowsy to answer many questions. 98% on 7L non-rebreather.

## 2020-05-03 NOTE — TOC Progression Note (Signed)
Transition of Care [facility]) - Progression Note    Patient Details  Name: Derrick Jones MRN: [id number] Date of Birth: [birth year]  Transition of Care [facility]) CM/SW [place], [place] Phone Number: [phone] 05/03/2020, 11:52 AM  Clinical Narrative:     Inpatient bed assigned.  Marine scientist.  Altered mental state, mittens placed.   Expected Discharge Plan: [place] Barriers to Discharge: Continued Medical Work up  Expected Discharge Plan and Services Expected Discharge Plan: [place] arrangements for the past 2 months: Single Family Home                                       Social Determinants of Health (SDOH) Interventions    Readmission Risk Interventions No flowsheet data found.

## 2020-05-03 NOTE — ED Notes (Signed)
L dorsalis pedis pulse 2+; foot cool; pulse ox attached to great toe.

## 2020-05-03 NOTE — H&P (Signed)
H&P reviewed. No significant changes noted.  

## 2020-05-03 NOTE — Anesthesia Preprocedure Evaluation (Addendum)
Anesthesia Evaluation  Patient identified by MRN, date of birth, ID band Patient awake    Reviewed: Allergy & Precautions, H&P , NPO status , Patient's Chart, lab work & pertinent test results  History of Anesthesia Complications Negative for: history of anesthetic complications  Airway       Comment: Limited ability to comply with mallampati exam Dental   Pulmonary neg sleep apnea, COPD,  COPD inhaler, Recent URI , Residual Cough, Current Smoker,  COVID positive, has been coughing for about a month   Pulmonary exam normal        Cardiovascular hypertension, (-) angina(-) Past MI and (-) Cardiac Stents Normal cardiovascular exam(-) dysrhythmias      Neuro/Psych Seizures - (1 year ago),  PSYCHIATRIC DISORDERS Anxiety Currently delirious, presumed to be associated with ETOH withdrawal.  Also has tremors    GI/Hepatic GERD  ,(+)     substance abuse  alcohol use, H/o esophageal stricture   Endo/Other  diabetes  Renal/GU      Musculoskeletal   Abdominal   Peds  Hematology negative hematology ROS (+)   Anesthesia Other Findings Consent for anesthesia obtained from wife via telephone.  KR  Past Medical History: No date: Diabetes mellitus without complication (HCC) No date: GERD (gastroesophageal reflux disease) No date: Hypertension  Past Surgical History: 07/21/2019: COLONOSCOPY WITH PROPOFOL; N/A     Comment:  Procedure: COLONOSCOPY WITH PROPOFOL;  Surgeon: Jonathon Bellows, MD;  Location: [facility] ENDOSCOPY;  Service:               Gastroenterology;  Laterality: N/A; 03/11/2020: ESOPHAGOGASTRODUODENOSCOPY (EGD) WITH PROPOFOL; N/A     Comment:  Procedure: ESOPHAGOGASTRODUODENOSCOPY (EGD) WITH               PROPOFOL;  Surgeon: Jonathon Bellows, MD;  Location: [facility]               ENDOSCOPY;  Service: Gastroenterology;  Laterality: N/A; 04/11/2020: ESOPHAGOGASTRODUODENOSCOPY (EGD) WITH PROPOFOL; N/A     Comment:   Procedure: ESOPHAGOGASTRODUODENOSCOPY (EGD) WITH               PROPOFOL;  Surgeon: Jonathon Bellows, MD;  Location: [facility]               ENDOSCOPY;  Service: Gastroenterology;  Laterality: N/A; No date: EYE SURGERY No date: FEMUR FRACTURE SURGERY; Left No date: FRACTURE SURGERY  BMI    Body Mass Index: 18.46 kg/m      Reproductive/Obstetrics negative OB ROS                          Anesthesia Physical Anesthesia Plan  ASA: III  Anesthesia Plan: Spinal   Post-op Pain Management:    Induction:   PONV Risk Score and Plan: Propofol infusion and Ondansetron  Airway Management Planned: Simple Face Mask  Additional Equipment:   Intra-op Plan:   Post-operative Plan:   Informed Consent: I have reviewed the patients History and Physical, chart, labs and discussed the procedure including the risks, benefits and alternatives for the proposed anesthesia with the patient or authorized representative who has indicated his/her understanding and acceptance.     Dental Advisory Given  Plan Discussed with: Anesthesiologist, CRNA and Surgeon  Anesthesia Plan Comments:         Anesthesia Quick Evaluation

## 2020-05-03 NOTE — Anesthesia Procedure Notes (Addendum)
Spinal  Patient location during procedure: OR Start time: 05/03/2020 1:58 PM End time: 05/03/2020 2:06 PM Staffing Performed: resident/CRNA  Anesthesiologist: Tera Mater, MD Resident/CRNA: Gentry Fitz, CRNA Preanesthetic Checklist Completed: patient identified, IV checked and monitors and equipment checked Spinal Block Patient position: right lateral decubitus Prep: Betadine and ChloraPrep Patient monitoring: heart rate, continuous pulse ox and blood pressure Approach: midline Location: L4-5 Injection technique: single-shot Needle Needle type: Pencan  Needle gauge: 24 G Catheter size: 19 g Assessment Sensory level: T12 Additional Notes 2.55ml of 0.5% marcaine placed x 1 attempt.  +CSF, neg Heme, no parathesia noted  Lot #[id number], exp: 2020-08-17 ([employer])

## 2020-05-03 NOTE — Transfer of Care (Signed)
Immediate Anesthesia Transfer of Care Note  Patient: Derrick Jones  Procedure(s) Performed: INTRAMEDULLARY (IM) NAIL INTERTROCHANTRIC (Left )  Patient Location: PACU and Nursing Unit  Anesthesia Type:Spinal  Level of Consciousness: drowsy  Airway & Oxygen Therapy: Patient Spontanous Breathing and Patient connected to face mask oxygen  Post-op Assessment: Report given to RN  Post vital signs: stable  Last Vitals:  Vitals Value Taken Time  BP    Temp    Pulse    Resp    SpO2      Last Pain:  Vitals:   05/03/20 1137  TempSrc:   PainSc: Asleep         Complications: No complications documented.

## 2020-05-03 NOTE — Progress Notes (Signed)
Initial Nutrition Assessment  DOCUMENTATION CODES:   Underweight  INTERVENTION:  Once diet is advanced provide Ensure Enlive po TID, each supplement provides 350 kcal and 20 grams of protein.  Pt would benefit from nutrient dense supplement. One Ensure Enlive supplement provides 350 kcals, 20 grams protein, and 44-45 grams of carbohydrate vs one Glucerna shake supplement, which provides 220 kcals, 10 grams of protein, and 26 grams of carbohydrate. Given pt's hx of DM, RD will reassess adequacy of PO intake, CBGS, and adjust supplement regimen as appropriate at follow-up.   Continue MVI daily.  NUTRITION DIAGNOSIS:   Increased nutrient needs related to post-op healing, catabolic illness (IRWER-15) as evidenced by estimated needs.  GOAL:   Patient will meet greater than or equal to 90% of their needs  MONITOR:   PO intake, Diet advancement, Supplement acceptance, Labs, Weight trends, I & O's, Skin  REASON FOR ASSESSMENT:   Consult Assessment of nutrition requirement/status  ASSESSMENT:   74 year old male with PMHx of DM, HTN, GERD admitted after a fall with intertrochanteric fracture of left femur, acute EtOH withdrawal delirium, possible UTI, incidental findings of COVID-19.   Unable to meet with patient as he went straight from ED to OR. Patient has been NPO for surgery today. Will monitor for diet advancement and adequacy of PO intake. Patient has increased calorie/protein needs for post-operative healing and also in setting of COVID-19.   Per review of chart patient was 70.3 kg on 02/12/2020. Since then he has been documented to be 52-56 kg. Unsure exactly when weight loss occurred. He is currently documented to be 56.7 kg (125 lbs) but unsure if this is a measured or stated weight.  Medications reviewed and include: Novolog 0-9 units Q4hrs, MVI daily, Protonix, thiamine 100 mg daily, ceftriaxone, famotidine.  Labs reviewed: CBG 74-129, Sodium 133, Chloride 97.  Patient is  at risk for malnutrition. Unable to determine if patient meets criteria for malnutrition at this time.  NUTRITION - FOCUSED PHYSICAL EXAM:  Unable to complete at this time.  Diet Order:   Diet Order            Diet NPO time specified  Diet effective midnight                EDUCATION NEEDS:   No education needs have been identified at this time  Skin:  Skin Assessment:  (RN skin assessment not yet completed at this time)  Last BM:  Unknown  Height:   Ht Readings from Last 1 Encounters:  05/02/20 5\' 9"  (1.753 m)   Weight:   Wt Readings from Last 1 Encounters:  05/02/20 56.7 kg   Ideal Body Weight:  72.7 kg  BMI:  Body mass index is 18.46 kg/m.  Estimated Nutritional Needs:   Kcal:  1700-1900  Protein:  85-95 grams  Fluid:  1.7 L/day  Jacklynn Barnacle, MS, RD, LDN Pager number available on Amion

## 2020-05-03 NOTE — ED Notes (Signed)
Patient dried and purewick re-applied by nurse Loa Socks myself.

## 2020-05-03 NOTE — ED Notes (Signed)
Patient blood sugar was 98 notified nurse Rico Junker. ,patient dry at this time.

## 2020-05-03 NOTE — ED Notes (Signed)
Wife called for update, states she has only spoken with "one doctor that was talking about a surgery".  Message sent Dr. Doristine Bosworth, to give wife a call with better update.

## 2020-05-03 NOTE — ED Notes (Signed)
Telesitter remains at bedside, bed alarm on and mittens in place.  Pt resting in bed, NAD noted, will monitor.

## 2020-05-03 NOTE — ED Notes (Signed)
Patient blood sugar was 74 at this time, notified Rico Junker. RN.

## 2020-05-03 NOTE — Op Note (Addendum)
DATE OF SURGERY: 05/03/2020  PREOPERATIVE DIAGNOSIS: Left peritrochanteric hip fracture  POSTOPERATIVE DIAGNOSIS: Left peritrochanteric hip fracture  PROCEDURE: Intramedullary nailing of L femur with cephalomedullary device  SURGEON: Cato Mulligan, MD  ANESTHESIA: spinal  EBL: 200 cc  IVF: per anesthesia record  COMPONENTS:  AOS Galileo Long Nail: 10x351mm; 173mm lag screw; 5x30mm antirotation screw; 49mm distal interlocking screws x 2  INDICATIONS: Derrick Jones is a 74 y.o. male who sustained an peritrochanteric femur fracture after a fall. Of note, he had a prior proximal femur fracture that was treated with an intramedullary implant that was subsequently removed for unknown reason. He presents in alcohol withdrawal. Risks and benefits of intramedullary nailing were explained to the patient and family (who were PoA). Risks include but are not limited to bleeding, infection, injury to tissues, nerves, vessels, nonunion/malunion, hardware failure, limb length discrepancy/hip rotation mismatch and risks of anesthesia. The patient's wife understands these risks, has completed an informed consent, and wishes to proceed.   PROCEDURE:  The patient was brought into the operating room. After administering anesthesia, the patient was placed in the supine position on the Hana table. The uninjured leg was placed in an extended position while the injured lower extremity was placed in longitudinal traction.  Appropriate reduction could not be obtained using traction and rotation.  There was some residual hip flexion on the lateral view.  The lateral aspect of the hip and thigh were prepped with ChloraPrep solution before being draped sterilely. Preoperative IV antibiotics were administered. A timeout was performed to verify the appropriate surgical site, patient, and procedure.    The greater trochanter was identified and an approximately 6 cm incision was made about 3 fingerbreadths above the tip of the  greater trochanter. The incision was carried down through the subcutaneous tissues to expose the gluteal fascia. This was split the length of the incision, providing access to the tip of the trochanter. Under fluoroscopic guidance, a starting awl was placed through the tip of the trochanter.  Next, a ball-tipped guidewire was placed through the starting awl and passed across the fracture fragments into the femoral diaphysis until reaching the distal femoral physeal scar.  This measured to be a approximately 390 mm nail.    Next, an approximately 10 cm incision was made about the lateral aspect of the femur.  Dissection was carried down through the IT band and through the vastus lateralis muscle.  Using combination of a bone hook and a ball spike pusher, appropriate fracture reduction was obtained.  This was verified on the AP and lateral views.  Additionally, the fracture site was palpated manually through this incision.  There was appropriate medial cortical contact and there was no significant flexion of the proximal fragment. The guidewire was overreamed sequentially using the flexible reamers starting with an 8 mm reamer due to the prior history of fracture in the femoral diaphysis.  Reaming was carried out to a 11.5 mm reamer. The appropriate sized nail was selected and advanced to the appropriate depth as verified fluoroscopically.    The guide system for the lag screw was positioned and advanced through through the previously made incision. The guidewire was drilled up through the femoral nail and into the femoral neck to rest within 5 mm of subchondral bone. After verifying its position in the femoral neck and head in both AP and lateral projections, the guidewire was measured and appropriate sized lag screw was selected. An antirotation screw was placed proximally through the appropriate  mechanism. The guidewire was then overreamed to the appropriate depth before the lag screw was inserted and advanced  to the appropriate depth as verified fluoroscopically in AP and lateral projections. The lag screw was advanced and the locking mechanism was deployed.  Pin was left in place such that this would remain a fixed angle construct.  Again, the adequacy of hardware position and fracture reduction was verified fluoroscopically in AP and lateral projections.   Attention was directed distally. Using the "perfect circle" technique, the leg and fluoroscopy machine were positioned appropriately. A 2cm stab incision was made over the skin and IT band at the appropriate point before the drill bit was advanced through the cortex and across the static hole of the nail. This was repeated for the oblong hole as well. Appropriate screw lengths were determined with a measuring guide. Two distal interlocking screws were placed. Again the adequacy of screw position was verified fluoroscopically in AP and lateral projections.  The wounds were irrigated thoroughly with sterile saline solution. Local anesthetic was injected into the wounds. Deep fascia was closed with 0-Vicryl. The subcutaneous tissues were closed using 2-0 Vicryl interrupted sutures. The skin was closed using staples. Sterile occlusive dressings were applied to all wounds. The patient was then awakened from anesthesia and recovered in satisfactory condition.   This case had additional complexity compared to standard peritrochanteric fracture and cephalomedullary nailing due to the fracture pattern (horizontal) leading to more instability and inability to obtain reduction in a closed fashion. A larger incision with more dissection had to be made to allow for bone hook and ball spike pusher placement for more appropriate reduction.  This had to be held in place while reaming and nail insertion occurred.  This surgery took more than doubled the normal amount of time, adding ~60 minutes to a usual cephalomedullary nailing.   POSTOPERATIVE PLAN: The patient will be  flatfoot/touchdown weight-bearing on the operative extremity x 6 weeks. Lovenox 40mg /day x 4 weeks to start on POD#1. Perioperative IV antibiotics x 24 hours. PT/OT on POD#1.

## 2020-05-03 NOTE — ED Notes (Signed)
Pt snoring; mouth breathing. Placed on 2L O2 for desat 89% while on RA.

## 2020-05-04 ENCOUNTER — Inpatient Hospital Stay: Payer: PPO

## 2020-05-04 LAB — BASIC METABOLIC PANEL
Anion gap: 13 (ref 5–15)
BUN: 17 mg/dL (ref 8–23)
CO2: 25 mmol/L (ref 22–32)
Calcium: 8.2 mg/dL — ABNORMAL LOW (ref 8.9–10.3)
Chloride: 99 mmol/L (ref 98–111)
Creatinine, Ser: 0.88 mg/dL (ref 0.61–1.24)
GFR calc Af Amer: >60 mL/min (ref >60)
GFR calc non Af Amer: >60 mL/min (ref >60)
Glucose, Bld: 99 mg/dL (ref 70–99)
Potassium: 3.8 mmol/L (ref 3.5–5.1)
Sodium: 137 mmol/L (ref 135–145)

## 2020-05-04 LAB — FIBRIN DERIVATIVES D-DIMER (ARMC ONLY): Fibrin derivatives D-dimer (ARMC): 1973.58 ng/mL (FEU) — ABNORMAL HIGH (ref 0.00–499.00)

## 2020-05-04 LAB — CBC
HCT: 29.5 % — ABNORMAL LOW (ref 39.0–52.0)
Hemoglobin: 10.1 g/dL — ABNORMAL LOW (ref 13.0–17.0)
MCH: 31.7 pg (ref 26.0–34.0)
MCHC: 34.2 g/dL (ref 30.0–36.0)
MCV: 92.5 fL (ref 80.0–100.0)
Platelets: 219 10*3/uL (ref 150–400)
RBC: 3.19 MIL/uL — ABNORMAL LOW (ref 4.22–5.81)
RDW: 15.4 % (ref 11.5–15.5)
WBC: 13 10*3/uL — ABNORMAL HIGH (ref 4.0–10.5)
nRBC: 0 % (ref 0.0–0.2)

## 2020-05-04 LAB — GLUCOSE, CAPILLARY
Glucose-Capillary: 101 mg/dL — ABNORMAL HIGH (ref 70–99)
Glucose-Capillary: 104 mg/dL — ABNORMAL HIGH (ref 70–99)
Glucose-Capillary: 105 mg/dL — ABNORMAL HIGH (ref 70–99)
Glucose-Capillary: 133 mg/dL — ABNORMAL HIGH (ref 70–99)
Glucose-Capillary: 146 mg/dL — ABNORMAL HIGH (ref 70–99)
Glucose-Capillary: 214 mg/dL — ABNORMAL HIGH (ref 70–99)
Glucose-Capillary: 99 mg/dL (ref 70–99)

## 2020-05-04 LAB — FERRITIN: Ferritin: 233 ng/mL (ref 24–336)

## 2020-05-04 LAB — C-REACTIVE PROTEIN: CRP: 20.3 mg/dL — ABNORMAL HIGH (ref <1.0)

## 2020-05-04 NOTE — Discharge Instructions (Signed)
INSTRUCTIONS AFTER Surgery  o Remove items at home which could result in a fall. This includes throw rugs or furniture in walking pathways o ICE to the affected joint every three hours while awake for 30 minutes at a time, for at least the first 3-5 days, and then as needed for pain and swelling.  Continue to use ice for pain and swelling. You may notice swelling that will progress down to the foot and ankle.  This is normal after surgery.  Elevate your leg when you are not up walking on it.   o Continue to use the breathing machine you got in the hospital (incentive spirometer) which will help keep your temperature down.  It is common for your temperature to cycle up and down following surgery, especially at night when you are not up moving around and exerting yourself.  The breathing machine keeps your lungs expanded and your temperature down.   DIET:  As you were doing prior to hospitalization, we recommend a well-balanced diet.  DRESSING / WOUND CARE / SHOWERING  Dressing change as needed.  No showering.  Staples will be removed in 2 weeks at [place]  o Increase activity slowly as tolerated, but follow the weight bearing instructions below.   o No driving for 6 weeks or until further direction given by your physician.  You cannot drive while taking narcotics.  o No lifting or carrying greater than 10 lbs. until further directed by your surgeon. o Avoid periods of inactivity such as sitting longer than an hour when not asleep. This helps prevent blood clots.  o You may return to work once you are authorized by your doctor.     WEIGHT BEARING  Flatfoot toe-touch weightbearing on the left leg with a walker   EXERCISES Gait training and ambulation training.  Strength training.  CONSTIPATION  Constipation is defined medically as fewer than three stools per week and severe constipation as less than one stool per week.  Even if you have a regular bowel pattern at home,  your normal regimen is likely to be disrupted due to multiple reasons following surgery.  Combination of anesthesia, postoperative narcotics, change in appetite and fluid intake all can affect your bowels.   YOU MUST use at least one of the following options; they are listed in order of increasing strength to get the job done.  They are all available over the counter, and you may need to use some, POSSIBLY even all of these options:    Drink plenty of fluids (prune juice may be helpful) and high fiber foods Colace 100 mg by mouth twice a day  Senokot for constipation as directed and as needed Dulcolax (bisacodyl), take with full glass of water  Miralax (polyethylene glycol) once or twice a day as needed.  If you have tried all these things and are unable to have a bowel movement in the first 3-4 days after surgery call either your surgeon or your primary doctor.    If you experience loose stools or diarrhea, hold the medications until you stool forms back up.  If your symptoms do not get better within 1 week or if they get worse, check with your doctor.  If you experience "the worst abdominal pain ever" or develop nausea or vomiting, please contact the office immediately for further recommendations for treatment.   ITCHING:  If you experience itching with your medications, try taking only a single pain pill, or even half a pain pill  at a time.  You can also use Benadryl over the counter for itching or also to help with sleep.   TED HOSE STOCKINGS:  Use stockings on both legs until for at least 2 weeks or as directed by physician office. They may be removed at night for sleeping.  MEDICATIONS:  See your medication summary on the "After Visit Summary" that nursing will review with you.  You may have some home medications which will be placed on hold until you complete the course of blood thinner medication.  It is important for you to complete the blood thinner medication as  prescribed.  PRECAUTIONS:  If you experience chest pain or shortness of breath - call 911 immediately for transfer to the hospital emergency department.   If you develop a fever greater that 101 F, purulent drainage from wound, increased redness or drainage from wound, foul odor from the wound/dressing, or calf pain - CONTACT YOUR SURGEON.                                                   FOLLOW-UP APPOINTMENTS:  If you do not already have a post-op appointment, please call the office for an appointment to be seen by your surgeon.  Guidelines for how soon to be seen are listed in your "After Visit Summary", but are typically between 1-4 weeks after surgery.  OTHER INSTRUCTIONS:     MAKE SURE YOU:  . Understand these instructions.  . Get help right away if you are not doing well or get worse.    Thank you for letting us be a part of your medical care team.  It is a privilege we respect greatly.  We hope these instructions will help you stay on track for a fast and full recovery!

## 2020-05-04 NOTE — Progress Notes (Signed)
Physical Therapy Treatment Patient Details Name: Derrick Jones MRN: [id number] DOB: 07/05/1946 Today's Date: 05/04/2020    History of Present Illness Derrick Jones is a 74 y.o. male with medical history significant for alcohol abuse, GERD hypertension and anxiety disorder who presented to the ER 9/16/21for evaluation of pain in his left hip and difficulty with ambulation.  Patient had been drinking 1 night prior to his admission and around 9 PM that night he lost balance and fell in his yard. Found to have intertrochanteric fracture of left femur and underwent intramedullary nailing 05/03/20. Toe touch weightbearing status with flat foot L LE (only pressure to rest foot, not support body). Patient found to be positive for COVID19 upon admission.    PT Comments    Patient sleeping upon arrival. Awoke and agreed to participate in PT. Vitals stayed [facility] throughout session on RA. Patient very cooperative and willing to complete PT. Required min A to come to sitting from supine to help move L LE. Completed two sit <> stand transfers using RW and min A including bed/bed and bed/chair. Practiced standing balance while using urinal with Min A from PT for steadying and garment management. Does have some difficulty keeping weight off L LE but improving with practice and shows better technique with hopping and managing RW. Was able to complete entire session on RA without SpO2 dropping which is an improvement. Continues to have pain with L hip flexion. Patient would benefit from continued management of limiting condition by skilled physical therapist to address remaining impairments and functional limitations to work towards stated goals and return to PLOF or maximal functional independence.      Follow Up Recommendations  SNF     Equipment Recommendations  Rolling walker with 5" wheels;3in1 (PT);Wheelchair (measurements PT);Wheelchair cushion (measurements PT)    Recommendations for Other Services        Precautions / Restrictions Precautions Precautions: Fall Restrictions Other Position/Activity Restrictions: LLE Touchdown WB with flat foot (weight of leg for balance)    Mobility  Bed Mobility Overal bed mobility: Needs Assistance Bed Mobility: Supine to Sit     Supine to sit: HOB elevated;Min assist     General bed mobility comments: L LE management  Transfers Overall transfer level: Needs assistance Equipment used: Rolling walker (2 wheeled) Transfers: Sit to/from Stand Sit to Stand: Min assist Stand pivot transfers: Min assist       General transfer comment: completed sit <> stand two reps (bed/bed and bed/chair) using with min A and PT foot under L LE to encourage compliance with weight bearing precautions.  Ambulation/Gait Ambulation/Gait assistance: Min assist Gait Distance (Feet): 2 Feet Assistive device: Rolling walker (2 wheeled)   Gait velocity: very slow   General Gait Details: Used RW and hopping technique to ambulate 2 feet from bed to chair. PT assist to help steady walker, maintain weight bearing status, steady patient. Step by step cuing for gait for hopping and positioning RW.   Stairs             Wheelchair Mobility    Modified Rankin (Stroke Patients Only)       Balance Overall balance assessment: Needs assistance Sitting-balance support: Feet supported;Bilateral upper extremity supported Sitting balance-Leahy Scale: Good Sitting balance - Comments: steady static balance at edge of bed, does fatigue quickly in chair and leans back on back support   Standing balance support: Bilateral upper extremity supported Standing balance-Leahy Scale: Poor Standing balance comment: BUE support on RW and MIN  A for balance during mobility and static standing                            Cognition Arousal/Alertness: Awake/alert Behavior During Therapy: WFL for tasks assessed/performed Overall Cognitive Status: No family/caregiver  present to determine baseline cognitive functioning                                 General Comments: alert and able to follow cuing      Exercises Other Exercises Other Exercises: assisted pt with garment management and balance when standing to use urinal; educated on purpose of mobility, weight bearing restrictions, DME use, safe transfer technique Other Exercises: Seated LE AROM exercises, 2x10 each: ankle pumps on the floor, Long arc quad (L within tolerable range), hip adduction contacting PT's fist between knees, hip abduction contacting PT's hands 1-2 inches lateral to knees. Did attempt seated marching but discontinued due to pain with attempts to actively flex L hip    General Comments General comments (skin integrity, edema, etc.): Vitals remained WFL on room air throughout session      Pertinent Vitals/Pain Pain Assessment: Faces Faces Pain Scale: Hurts whole lot Pain Location: left hip when attempting to flex Pain Descriptors / Indicators: Grimacing;Guarding Pain Intervention(s): Limited activity within patient's tolerance;Monitored during session;Repositioned    Home Living                      Prior Function            PT Goals (current goals can now be found in the care plan section) Acute Rehab PT Goals Patient Stated Goal: get better PT Goal Formulation: With patient Time For Goal Achievement: 05/18/20 Potential to Achieve Goals: Good Progress towards PT goals: Progressing toward goals    Frequency    BID      PT Plan Current plan remains appropriate    Co-evaluation              AM-PAC PT "6 Clicks" Mobility   Outcome Measure  Help needed turning from your back to your side while in a flat bed without using bedrails?: A Little Help needed moving from lying on your back to sitting on the side of a flat bed without using bedrails?: A Little Help needed moving to and from a bed to a chair (including a wheelchair)?: A  Lot Help needed standing up from a chair using your arms (e.g., wheelchair or bedside chair)?: A Little Help needed to walk in hospital room?: Total Help needed climbing 3-5 steps with a railing? : Total 6 Click Score: 13    End of Session Equipment Utilized During Treatment: Gait belt;Oxygen (1L/min on non-rebreather at end of session) Activity Tolerance: Patient tolerated treatment well;Patient limited by pain Patient left: in chair;with call bell/phone within reach;with chair alarm set Nurse Communication: Mobility status PT Visit Diagnosis: Unsteadiness on feet (R26.81);History of falling (Z91.81);Muscle weakness (generalized) (M62.81);Pain Pain - Right/Left: Left Pain - part of body: Hip     Time: 5009-3818 PT Time Calculation (min) (ACUTE ONLY): 33 min  Charges:  $Therapeutic Exercise: 8-22 mins $Therapeutic Activity: 8-22 mins                     Everlean Alstrom. Graylon Good, PT, DPT 05/04/20, 5:00 PM

## 2020-05-04 NOTE — Evaluation (Signed)
Occupational Therapy Evaluation Patient Details Name: Derrick Jones MRN: [id number] DOB: Nov 06, 1945 Today's Date: 05/04/2020    History of Present Illness HERMANN DOTTAVIO is a 74 y.o. male with medical history significant for alcohol abuse, GERD hypertension and anxiety disorder who presented to the ER 9/16/21for evaluation of pain in his left hip and difficulty with ambulation.  Patient had been drinking 1 night prior to his admission and around 9 PM that night he lost balance and fell in his yard. Found to have intertrochanteric fracture of left femur and underwent intramedullary nailing 05/03/20. Toe touch weightbearing status with flat foot L LE (only pressure to rest foot, not support body). Patient found to be positive for COVID19 upon admission.   Clinical Impression   Mr Deady was seen for OT/PT co-evaluation this date. Prior to hospital admission, pt was Independent for mobility and ADLs. Pt lives c wife in mobile home c ramped entrance. Pt presents to acute OT demonstrating impaired ADL performance and functional mobility 2/2 decreased safety awareness, functional strength/ROM/balance deficits, decreased LB access, and decreased activity tolerance. Pt currently requires MAX A for LBD at bed level. SBA for UBD seated EOB. MIN A x2 + RW for ADL t/f. MOD I self-feeding at bed level. During mobility pts SpO2 dropped and increased WoB noted on RA however unable to get accurate read with pulse ox - resolved c 1L non-rebreatherand to mid 90s. Pt would benefit from skilled OT to address noted impairments and functional limitations (see below for any additional details) in order to maximize safety and independence while minimizing falls risk and caregiver burden. Upon hospital discharge, recommend STR to maximize pt safety and return to PLOF.     Follow Up Recommendations  SNF    Equipment Recommendations  Other (comment) (TBD)    Recommendations for Other Services       Precautions / Restrictions  Precautions Precautions: Fall Restrictions Weight Bearing Restrictions: Yes LLE Weight Bearing: Touchdown weight bearing Other Position/Activity Restrictions: LLE Touchdown WB with flat foot (weight of leg for balance)      Mobility Bed Mobility Overal bed mobility: Needs Assistance Bed Mobility: Supine to Sit     Supine to sit: Mod assist;+2 for physical assistance;HOB elevated     General bed mobility comments: MOD A trunk and LLE mgmt   Transfers Overall transfer level: Needs assistance Equipment used: Rolling walker (2 wheeled) Transfers: Sit to/from [facility] Sit to Stand: Min assist;+2 physical assistance;+2 safety/equipment Stand pivot transfers: Min assist;+2 physical assistance;+2 safety/equipment       General transfer comment: assist for RW mgmt, lines mgmt, to maintain WBing pcns, and balance SPT bed>chair    Balance Overall balance assessment: Needs assistance Sitting-balance support: Single extremity supported;Feet supported Sitting balance-Leahy Scale: Good Sitting balance - Comments: teadt static balance   Standing balance support: Bilateral upper extremity supported Standing balance-Leahy Scale: Poor Standing balance comment: BUE support on RW and MIN A for balance during mobility and static standing        ADL either performed or assessed with clinical judgement   ADL Overall ADL's : Needs assistance/impaired       General ADL Comments: MAX A for LBD at bed level. MIN A x2 + RW for ADL t/f. SBA for UBD seated EOB. MOD I self-feeding at bed level                   Pertinent Vitals/Pain Pain Assessment: Faces Faces Pain Scale: Hurts little more Pain  Location: left hip when flexing it Pain Descriptors / Indicators: Grimacing;Guarding Pain Intervention(s): Limited activity within patient's tolerance;Monitored during session;Repositioned     Hand Dominance     Extremity/Trunk Assessment Upper Extremity Assessment Upper  Extremity Assessment: Generalized weakness   Lower Extremity Assessment Lower Extremity Assessment: Generalized weakness;LLE deficits/detail LLE Deficits / Details: unable to lift L LE off bed, painful   Cervical / Trunk Assessment Cervical / Trunk Assessment: Normal   Communication Communication Communication: No difficulties   Cognition Arousal/Alertness: Awake/alert Behavior During Therapy: WFL for tasks assessed/performed Overall Cognitive Status: No family/caregiver present to determine baseline cognitive functioning      General Comments: A&O x4 however intermittent confusion noted   General Comments  Resting: HR 101 bpm, SpO2 91% on room air. Pateint's SpO2 dropped on RA during mobility but unable to get accurate read with pulse ox. Applied non-rebreather at 1L/min and SpO2 returned to mid 90s after ambulation. HR significantly increased up to 126 bpm.    Exercises Exercises: Other exercises Other Exercises Other Exercises: Pt educated re: OT role, DME recs, importance of mobility for pain mgmt, falls prevention, ECS Other Exercises: LBD, UBD, sup>sit, sti<>stand, SPT, sitting/standing balance/tolerance, phone education   Shoulder Instructions      Home Living Family/patient expects to be discharged to:: Private residence Living Arrangements: Spouse/significant other Available Help at Discharge: Family Type of Home: Mobile home Home Access: Ramped entrance     Home Layout: One level     Bathroom Shower/Tub: Walk-in shower         Home Equipment: Shower seat   Additional Comments: wife uses shower seat       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 / Transfers Assistance Needed: walks daily with no AD ADL's / Homemaking Assistance Needed: does not drive            OT Problem List: Decreased strength;Decreased range of motion;Decreased activity tolerance;Impaired balance (sitting and/or standing);Decreased safety  awareness;Decreased knowledge of use of DME or AE      OT Treatment/Interventions: Self-care/ADL training;Therapeutic exercise;Energy conservation;DME and/or AE instruction;Therapeutic activities;Balance training;Patient/family education    OT Goals(Current goals can be found in the care plan section) Acute Rehab OT Goals Patient Stated Goal: get better OT Goal Formulation: With patient Time For Goal Achievement: 05/18/20 Potential to Achieve Goals: Good ADL Goals Pt Will Perform Grooming: with modified independence;sitting Pt Will Perform Lower Body Dressing: with min assist;sitting/lateral leans Pt Will Transfer to Toilet: with min assist;stand pivot transfer;bedside commode (c LRAD PRN) Additional ADL Goal #1: Pt will Independently verbalize plan to implement x3 falls prevention strategies  OT Frequency: Min 2X/week   Barriers to D/C: Inaccessible home environment;Decreased caregiver support          Co-evaluation PT/OT/SLP Co-Evaluation/Treatment: Yes Reason for Co-Treatment: Complexity of the patient's impairments (multi-system involvement);Necessary to address cognition/behavior during functional activity;To address functional/ADL transfers PT goals addressed during session: Mobility/safety with mobility;Proper use of DME;Balance OT goals addressed during session: ADL's and self-care;Proper use of Adaptive equipment and DME      AM-PAC OT "6 Clicks" Daily Activity     Outcome Measure Help from another person eating meals?: None Help from another person taking care of personal grooming?: A Little Help from another person toileting, which includes using toliet, bedpan, or urinal?: A Lot Help from another person bathing (including washing, rinsing, drying)?: A Lot Help from another person to put on and taking off regular upper body clothing?: A Little Help from another person to  put on and taking off regular lower body clothing?: A Lot 6 Click Score: 16   End of Session  Equipment Utilized During Treatment: Gait belt;Rolling walker;Oxygen Nurse Communication: Mobility status  Activity Tolerance: Patient tolerated treatment well Patient left: in chair;with call bell/phone within reach;with chair alarm set  OT Visit Diagnosis: Other abnormalities of gait and mobility (R26.89);Muscle weakness (generalized) (M62.81)                Time: 8264-1583 OT Time Calculation (min): 44 min Charges:  OT General Charges $OT Visit: 1 Visit OT Evaluation $OT Eval Moderate Complexity: 1 Mod OT Treatments $Self Care/Home Management : 23-37 mins  Dessie Coma, M.S. OTR/L  05/04/20, 10:59 AM  ascom [phone]

## 2020-05-04 NOTE — Progress Notes (Signed)
PROGRESS NOTE    Derrick Jones  VEL:[id number] DOB: 1946-05-21 DOA: 05/02/2020 PCP: Cletis Athens, MD   Brief Narrative:  HPI: Derrick Jones is a 74 y.o. male with medical history significant for alcohol abuse, GERD hypertension and anxiety disorder who presented to the ER for evaluation of pain in his left hip and difficulty with ambulation.  Patient had been drinking 1 night prior to his admission and around 9 PM that night he lost balance and fell in his yard.  He developed severe pain in his left hip and was unable to ambulate since then.  He was unable to bear weight on the lower extremity.  Labs reveal sodium 131, potassium 3.6, chloride 93, bicarb 21, BUN 14, creatinine 0.89, calcium 8.9, AST 104, ALT 43, white count 26.8, hemoglobin 13.2, hematocrit 38.3, MCV 91.4, RDW 14.6, platelet count 283 Chest x-ray reviewed by me shows chest x-ray reviewed by me shows no obvious infiltrate or effusion. Left hip x-ray shows comminuted intertrochanteric fracture of the left proximal femur with varus deformity and apex anterior angulation with foreshortening.  Signs of prior trauma to the left femur with healed left femoral fracture and evidence of previous intramedullary nailing, hardware not in place currently. Left knee x-ray shows no abnormality Twelve-lead EKG reviewed by me shows normal sinus rhythm with nonspecific T wave abnormality COVID-19 PCR test was positive.  Patient was admitted under hospitalist service and orthopedics were consulted.   Assessment & Plan:   Principal Problem:   Intertrochanteric fracture of left femur (HCC) Active Problems:   Anxiety   Hypertension   Alcohol abuse   Leukocytosis   Fall   COVID-19 virus detected   Intertrochanteric fracture of left hip (HCC)   Closed displaced intertrochanteric fracture of left femur (HCC)   Alcohol use with withdrawal delirium   Closed left hip fracture (HCC)   Intertrochanteric fracture of left femur: Orthopedics on board.   Status post Intramedullary nailing of L femur with cephalomedullary device on 05/03/2020  Acute alcohol withdrawal delirium: Patient much better.  Alert and oriented.  Very minimal fine tremors in upper extremities.  Upon chart review, it sounds like he has not required any Ativan in last 24 hours.  Continue CIWA protocol with as needed Ativan and multivitamins.  Leukocytosis/possible UTI:   Chest x-ray clear but UA shows positive nitrites.  Highly suggest UTI in a male patient.  Continue Rocephin and follow culture.   COVID-19 virus detected, incidentally: Patient was screened in the emergency room and his COVID-19 PCR test is positive He is asymptomatic.  Lungs clear to auscultation.  Chest x-ray clear. Previous admitting hospitalist discussed with patient's wife Ms Tyton Abdallah who consents for him to receive monoclonal antibodies.  He received 1 dose of antibodies already.  He is on 2 L oxygen just for comfort reason.  I have advised RN to take that off.  Essential hypertension: Blood pressure controlled.  Continue atenolol.  Diabetes mellitus type II: Hold Metformin.  Blood sugar controlled.  Continue SSI.  DVT prophylaxis: enoxaparin (LOVENOX) injection 40 mg Start: 05/04/20 0800 SCDs Start: 05/03/20 1724   Code Status: Full Code  Family Communication:  None present at bedside.  Called and updated patient's wife.  Status is: Inpatient  Remains inpatient appropriate because:Inpatient level of care appropriate due to severity of illness   Dispo: The patient is from: Home              Anticipated d/c is to: Home  Anticipated d/c date is: 1-2 days              Patient currently is not medically stable to d/c.        Estimated body mass index is 18.46 kg/m as calculated from the following:   Height as of this encounter: 5\' 9"  (1.753 m).   Weight as of this encounter: 56.7 kg.      Nutritional status:  Nutrition Problem: Increased nutrient needs Etiology:  post-op healing, catabolic illness ([id number])   Signs/Symptoms: estimated needs   Interventions: Refer to RD note for recommendations    Consultants:   Orthopedics  Procedures:  Intramedullary nailing of L femur with cephalomedullary device  Antimicrobials:  Anti-infectives (From admission, onward)   Start     Dose/Rate Route Frequency Ordered Stop   05/03/20 1045  cefTRIAXone (ROCEPHIN) 1 g in sodium chloride 0.9 % 100 mL IVPB        1 g 200 mL/hr over 30 Minutes Intravenous Every 24 hours 05/03/20 1030     05/03/20 0615  ceFAZolin (ANCEF) IVPB 1 g/50 mL premix        1 g 100 mL/hr over 30 Minutes Intravenous  Once 05/02/20 1636 05/03/20 1415         Subjective: Seen and examined.  He is completely alert and oriented.  Very minimal upper extremity tremors.  No other complaint.  Objective: Vitals:   05/04/20 0035 05/04/20 0552 05/04/20 0826 05/04/20 1000  BP: 102/69 104/77 133/85 107/70  Pulse: (!) 105 97 (!) 105 90  Resp: 18 16 20 19   Temp: 98.2 F (36.8 C) 98.3 F (36.8 C) 97.9 F (36.6 C)   TempSrc: Oral Oral Oral   SpO2: 94% 94% 91% 94%  Weight:      Height:        Intake/Output Summary (Last 24 hours) at 05/04/2020 1044 Last data filed at 05/04/2020 0554 Gross per 24 hour  Intake 1493.99 ml  Output 900 ml  Net 593.99 ml   Filed Weights   05/02/20 0716  Weight: 56.7 kg    Examination:  General exam: Appears calm and comfortable with minimal upper extremity tremors Respiratory system: Clear to auscultation. Respiratory effort normal. Cardiovascular system: S1 & S2 heard, RRR. No JVD, murmurs, rubs, gallops or clicks. No pedal edema. Gastrointestinal system: Abdomen is nondistended, soft and nontender. No organomegaly or masses felt. Normal bowel sounds heard. Central nervous system: Alert and oriented. No focal neurological deficits. Skin: No rashes, lesions or ulcers.  Psychiatry: Judgement and insight appear normal. Mood & affect appropriate.       Data Reviewed: I have personally reviewed following labs and imaging studies  CBC: Recent Labs  Lab 05/02/20 0800 05/03/20 0501 05/04/20 0555  WBC 26.8* 18.4* 13.0*  HGB 13.2 12.5* 10.1*  HCT 38.3* 36.7* 29.5*  MCV 91.4 92.7 92.5  PLT 283 226 259   Basic Metabolic Panel: Recent Labs  Lab 05/02/20 0800 05/03/20 0501 05/04/20 0555  NA 131* 133* 137  K 3.6 3.6 3.8  CL 93* 97* 99  CO2 21* 28 25  GLUCOSE 146* 95 99  BUN 14 12 17   CREATININE 0.89 0.76 0.88  CALCIUM 8.9 8.3* 8.2*   GFR: Estimated Creatinine Clearance: 59.1 mL/min (by C-G formula based on SCr of 0.88 mg/dL). Liver Function Tests: Recent Labs  Lab 05/02/20 0800  AST 104*  ALT 43  ALKPHOS 41  BILITOT 1.4*  PROT 6.8  ALBUMIN 3.7   No results for input(s): LIPASE,  AMYLASE in the last 168 hours. No results for input(s): AMMONIA in the last 168 hours. Coagulation Profile: Recent Labs  Lab 05/02/20 0800  INR 1.2   Cardiac Enzymes: No results for input(s): CKTOTAL, CKMB, CKMBINDEX, TROPONINI in the last 168 hours. BNP (last 3 results) No results for input(s): PROBNP in the last 8760 hours. HbA1C: Recent Labs    05/02/20 1235  HGBA1C 6.4*   CBG: Recent Labs  Lab 05/03/20 1734 05/03/20 2038 05/04/20 0035 05/04/20 0524 05/04/20 0807  GLUCAP 75 83 101* 105* 99   Lipid Profile: No results for input(s): CHOL, HDL, LDLCALC, TRIG, CHOLHDL, LDLDIRECT in the last 72 hours. Thyroid Function Tests: No results for input(s): TSH, T4TOTAL, FREET4, T3FREE, THYROIDAB in the last 72 hours. Anemia Panel: Recent Labs    05/04/20 0601  FERRITIN 233   Sepsis Labs: No results for input(s): PROCALCITON, LATICACIDVEN in the last 168 hours.  Recent Results (from the past 240 hour(s))  SARS Coronavirus 2 by RT PCR (hospital order, performed in [facility] hospital lab) Nasopharyngeal Nasopharyngeal Swab     Status: Abnormal   Collection Time: 05/02/20  8:00 AM   Specimen: Nasopharyngeal Swab   Result Value Ref Range Status   SARS Coronavirus 2 POSITIVE (A) NEGATIVE Final    Comment: RESULT CALLED TO, READ BACK BY AND VERIFIED WITH: STEPHANIE RUDD AT 6389 ON 05/02/2020 [place]. (NOTE) SARS-CoV-2 target nucleic acids are DETECTED  SARS-CoV-2 RNA is generally detectable in upper respiratory specimens  during the acute phase of infection.  Positive results are indicative  of the presence of the identified virus, but do not rule out bacterial infection or co-infection with other pathogens not detected by the test.  Clinical correlation with patient history and  other diagnostic information is necessary to determine patient infection status.  The expected result is negative.  Fact Sheet for Patients:   StrictlyIdeas.no   Fact Sheet for Healthcare Providers:   BankingDealers.co.za    This test is not yet approved or cleared by the [place] FDA and  has been authorized for detection and/or diagnosis of SARS-CoV-2 by FDA under an Emergency Use Authorization (EUA).  This EUA will remain in effect (meaning t his test can be used) for the duration of  the COVID-19 declaration under Section 564(b)(1) of the Act, 21 U.S.C. section 360-bbb-3(b)(1), unless the authorization is terminated or revoked sooner.  Performed at [facility], [place], [address], [place], [place] [address]       Radiology Studies: DG HIP OPERATIVE [facility] OR W/O PELVIS LEFT  Result Date: 05/03/2020 CLINICAL DATA:  Intraoperative evaluation. EXAM: OPERATIVE LEFT HIP (WITH PELVIS IF PERFORMED)  VIEWS TECHNIQUE: Fluoroscopic spot image(s) were submitted for interpretation post-operatively. COMPARISON:  May 02, 2020 FINDINGS: A radiopaque intramedullary rod and compression screw device are seen within the proximal left femur. An acute fracture of the inter trochanteric region of the proximal left femur is also noted. There is gross anatomic alignment.  There is no evidence of dislocation. IMPRESSION: Status post open reduction and internal fixation of the proximal left femur. Electronically Signed   By: Virgina Norfolk M.D.   On: 05/03/2020 16:43    Scheduled Meds: . acetaminophen  1,000 mg Oral Q8H  . atenolol  100 mg Oral Daily  . atorvastatin  20 mg Oral Daily  . docusate sodium  100 mg Oral BID  . enoxaparin (LOVENOX) injection  40 mg Subcutaneous Q24H  . feeding supplement (ENSURE ENLIVE)  237 mL Oral TID  BM  . insulin aspart  0-9 Units Subcutaneous Q4H  . melatonin  2.5 mg Oral QHS  . multivitamin with minerals  1 tablet Oral Daily  . pantoprazole  40 mg Oral Daily  . thiamine  100 mg Oral Daily   Or  . thiamine  100 mg Intravenous Daily   Continuous Infusions: . sodium chloride 75 mL/hr at 05/04/20 0532  . cefTRIAXone (ROCEPHIN)  IV 1 g (05/04/20 0920)  . famotidine (PEPCID) IV    . methocarbamol (ROBAXIN) IV       LOS: 2 days   Time spent: 30 minutes   Darliss Cheney, MD [facility]  05/04/2020, 10:44 AM   To contact the attending provider between 7A-7P or the covering provider during after hours 7P-7A, please log into the web site [email or web address].

## 2020-05-04 NOTE — Progress Notes (Signed)
  Subjective: 1 Day Post-Op Procedure(s) (LRB): INTRAMEDULLARY (IM) NAIL INTERTROCHANTRIC (Left) Patient reports pain as mild.   Patient is well, and has had no acute complaints or problems Plan is to go home versus Rehab after hospital stay. Negative for chest pain and shortness of breath Fever: no Gastrointestinal: Negative for nausea and vomiting  Objective: Vital signs in last 24 hours: Temp:  [97.9 F (36.6 C)-98.3 F (36.8 C)] 98.3 F (36.8 C) (09/18 0552) Pulse Rate:  [87-109] 97 (09/18 0552) Resp:  [3-18] 16 (09/18 0552) BP: (102-146)/(52-119) 104/77 (09/18 0552) SpO2:  [93 %-100 %] 94 % (09/18 0552)  Intake/Output from previous day:  Intake/Output Summary (Last 24 hours) at 05/04/2020 0726 Last data filed at 05/04/2020 0554 Gross per 24 hour  Intake 1493.99 ml  Output 900 ml  Net 593.99 ml    Intake/Output this shift: No intake/output data recorded.  Labs: Recent Labs    05/02/20 0800 05/03/20 0501 05/04/20 0555  HGB 13.2 12.5* 10.1*   Recent Labs    05/03/20 0501 05/04/20 0555  WBC 18.4* 13.0*  RBC 3.96* 3.19*  HCT 36.7* 29.5*  PLT 226 219   Recent Labs    05/02/20 0800 05/03/20 0501  NA 131* 133*  K 3.6 3.6  CL 93* 97*  CO2 21* 28  BUN 14 12  CREATININE 0.89 0.76  GLUCOSE 146* 95  CALCIUM 8.9 8.3*   Recent Labs    05/02/20 0800  INR 1.2     EXAM General - Patient is Alert and Oriented Extremity - Neurovascular intact Sensation intact distally Dorsiflexion/Plantar flexion intact  Compartments soft Dressing/Incision - clean, dry, no drainage Motor Function - intact, moving foot and toes well on exam.   Past Medical History:  Diagnosis Date  . Diabetes mellitus without complication ([place])   . GERD (gastroesophageal reflux disease)   . Hypertension     Assessment/Plan: 1 Day Post-Op Procedure(s) (LRB): INTRAMEDULLARY (IM) NAIL INTERTROCHANTRIC (Left) Principal Problem:   Intertrochanteric fracture of left femur  (HCC) Active Problems:   Anxiety   Hypertension   Alcohol abuse   Leukocytosis   Fall   COVID-19 virus detected   Intertrochanteric fracture of left hip (HCC)   Closed displaced intertrochanteric fracture of left femur (HCC)   Alcohol use with withdrawal delirium   Closed left hip fracture (HCC)  Estimated body mass index is 18.46 kg/m as calculated from the following:   Height as of this encounter: 5\' 9"  (1.753 m).   Weight as of this encounter: 56.7 kg. Advance diet Up with therapy D/C IV fluids  Follow-up at [facility] clinic orthopedics in 2 weeks after discharge.  DVT Prophylaxis - Lovenox, Foot Pumps and TED hose Toe-touch flatfoot Weight-Bearing to left leg  Reche Dixon, PA-C Orthopaedic Surgery 05/04/2020, 7:26 AM

## 2020-05-04 NOTE — Evaluation (Signed)
Physical Therapy Evaluation Patient Details Name: Derrick Jones MRN: [id number] DOB: [birth year] Today's Date: 05/04/2020   History of Present Illness  Derrick Jones is a 74 y.o. male with medical history significant for alcohol abuse, GERD hypertension and anxiety disorder who presented to the ER 9/16/21for evaluation of pain in his left hip and difficulty with ambulation.  Patient had been drinking 1 night prior to his admission and around 9 PM that night he lost balance and fell in his yard. Found to have intertrochanteric fracture of left femur and underwent intramedullary nailing 05/03/20. Toe touch weightbearing status with flat foot L LE (only pressure to rest foot, not support body). Patient found to be positive for COVID19 upon admission.    Clinical Impression  Patient reclining in bed upon arrival. Alert and mostly oriented, although does get wife's phone number wrong. Able to provide basic information about home and prior level of function. Lives with wife in a mobile home with ramp to enter. Was previously independent with daily ambulation using no AD and independent in all activities except driving. Does not have an assistive device. Upon PT eval, patient has difficulty lifting L LE from the hip and requires min A for supine to sit bed mobility to support at trunk and help scoot L LE off edge of bed. Required min A +2 to maintain touch down weight bearing during sit <> stand and hopping ambulation 2 feet with RW. Patient has experienced a significant decline in functional independence and would benefit from short term rehab prior to returning home safely. Requires step by step cuing to maintain weight bearing status and safely use RW for mobility. SpO2 dropped below 88% on room air and HR significantly increased during mobility. Returned to baseline with rest and placement of 1L/min of O2 on non-rebreather at end of session. Patient would benefit from skilled physical therapy to address impairments  and functional limitations (see PT Problem List below) to work towards stated goals and return to PLOF or maximal functional independence.       Follow Up Recommendations SNF    Equipment Recommendations  Rolling walker with 5" wheels;3in1 (PT);Wheelchair (measurements PT);Wheelchair cushion (measurements PT)    Recommendations for Other Services       Precautions / Restrictions Precautions Precautions: Other (comment);Fall (LLE Touchdown weight bearnig with flat foot (only to support weight of leg)) Restrictions Weight Bearing Restrictions: Yes LLE Weight Bearing:  (Touchdown weight bearnig with flat foot (only to support weight of leg))      Mobility  Bed Mobility Overal bed mobility: Needs Assistance Bed Mobility: Supine to Sit     Supine to sit: +2 for safety/equipment;Min assist     General bed mobility comments: supine to sit with min A at trunk and to move L LE  Transfers Overall transfer level: Needs assistance Equipment used: Rolling walker (2 wheeled) Transfers: Sit to/from Stand Sit to Stand: +2 safety/equipment;+2 physical assistance         General transfer comment: sit <> stand min A +2 assist (one person on each side and PT foot placed under pt's L LE to determine and reinforce toe touch weight bearing) from edge of bed to chair with hopping techinque. Assist at trunk and to steady and move walker at times. Step by step cuing for hand placement, weight bearing, and hopping.  Ambulation/Gait Ambulation/Gait assistance: Min assist;+2 physical assistance;+2 safety/equipment Gait Distance (Feet): 2 Feet Assistive device: Rolling walker (2 wheeled)   Gait velocity: very slow  General Gait Details: Used RW and hopping technique to ambulate 2 feet from bed to chair. +2 assist to help steady walker, maintain weight bearing status, steady patient. Step by step cuing for gait for hopping and positioning RW.  Stairs            Wheelchair Mobility     Modified Rankin (Stroke Patients Only)       Balance Overall balance assessment: Needs assistance Sitting-balance support: Bilateral upper extremity supported;Feet supported Sitting balance-Leahy Scale: Good Sitting balance - Comments: steady sitting at edge of bed after having assistance to come to that position, more steady with time.     Standing balance-Leahy Scale: Poor Standing balance comment: patient requires BUE support to maintain steady standing while maintaining weight bearing precautions. Practiced standing and pursed lip breathing for approx 5 min before moving to chair.                             Pertinent Vitals/Pain Pain Assessment: Faces Faces Pain Scale: Hurts little more Pain Location: left hip when flexing it Pain Descriptors / Indicators: Grimacing;Guarding Pain Intervention(s): Limited activity within patient's tolerance;Monitored during session;Repositioned    Home Living Family/patient expects to be discharged to:: Private residence Living Arrangements: Spouse/significant other Available Help at Discharge: Family Type of Home: Mobile home Home Access: Ramped entrance     Home Layout: One level Home Equipment: None      Prior Function Level of Independence: Needs assistance   Gait / Transfers Assistance Needed: walks daily with no AD  ADL's / Homemaking Assistance Needed: does not drive        Hand Dominance        Extremity/Trunk Assessment   Upper Extremity Assessment Upper Extremity Assessment: Generalized weakness    Lower Extremity Assessment Lower Extremity Assessment: LLE deficits/detail;Generalized weakness LLE Deficits / Details: unable to lift L LE off bed, painful    Cervical / Trunk Assessment Cervical / Trunk Assessment: Normal  Communication   Communication: No difficulties  Cognition Arousal/Alertness: Awake/alert Behavior During Therapy: WFL for tasks assessed/performed Overall Cognitive Status:  No family/caregiver present to determine baseline cognitive functioning                                 General Comments: patient alert and mostly oriented but has deficits. Confidently provided wifes phone number, but it was inaccurate. Stated he has had no falls, but then said he fell and broke his hip prior to hospitalization.      General Comments General comments (skin integrity, edema, etc.): Resting: HR 101 bpm, SpO2 91% on room air. Pateint's SpO2 dropped on RA during mobility but unable to get accurate read with pulse ox. Applied non-rebreather at 1L/min and SpO2 returned to mid 90s after ambulation. HR significantly increased up to 126 bpm.    Exercises Other Exercises Other Exercises: educated pt on weight bearing status, use of RW   Assessment/Plan    PT Assessment Patient needs continued PT services  PT Problem List Decreased strength;Decreased coordination;Pain;Cardiopulmonary status limiting activity;Decreased range of motion;Decreased cognition;Decreased activity tolerance;Decreased knowledge of use of DME;Decreased balance;Decreased safety awareness;Decreased mobility;Decreased knowledge of precautions;Decreased skin integrity       PT Treatment Interventions DME instruction;Balance training;Gait training;Neuromuscular re-education;Stair training;Cognitive remediation;Functional mobility training;Patient/family education;Therapeutic activities;Therapeutic exercise    PT Goals (Current goals can be found in the Care Plan section)  Acute Rehab  PT Goals Patient Stated Goal: get better PT Goal Formulation: With patient Time For Goal Achievement: 05/18/20 Potential to Achieve Goals: Good    Frequency BID   Barriers to discharge Decreased caregiver support;Inaccessible home environment patient requires increased level of assistance and is not yet able to ambulate more than 2 feet with assistance    Co-evaluation PT/OT/SLP Co-Evaluation/Treatment:  Yes Reason for Co-Treatment: Complexity of the patient's impairments (multi-system involvement);Necessary to address cognition/behavior during functional activity;For patient/therapist safety;To address functional/ADL transfers PT goals addressed during session: Mobility/safety with mobility;Balance;Proper use of DME;Strengthening/ROM OT goals addressed during session: ADL's and self-care;Proper use of Adaptive equipment and DME;Strengthening/ROM       AM-PAC PT "6 Clicks" Mobility  Outcome Measure Help needed turning from your back to your side while in a flat bed without using bedrails?: A Little Help needed moving from lying on your back to sitting on the side of a flat bed without using bedrails?: A Little Help needed moving to and from a bed to a chair (including a wheelchair)?: A Lot Help needed standing up from a chair using your arms (e.g., wheelchair or bedside chair)?: A Little Help needed to walk in hospital room?: Total Help needed climbing 3-5 steps with a railing? : Total 6 Click Score: 13    End of Session Equipment Utilized During Treatment: Gait belt;Oxygen (1L/min on non-rebreather at end of session)   Patient left: in chair;with call bell/phone within reach;with chair alarm set Nurse Communication: Mobility status PT Visit Diagnosis: Unsteadiness on feet (R26.81);History of falling (Z91.81);Muscle weakness (generalized) (M62.81);Pain Pain - Right/Left: Left Pain - part of body: Hip    Time: 4268-3419 PT Time Calculation (min) (ACUTE ONLY): 45 min   Charges:   PT Evaluation $PT Eval Moderate Complexity: 1 Mod PT Treatments $Therapeutic Activity: 8-22 mins        Everlean Alstrom. Graylon Good, PT, DPT 05/04/20, 9:46 AM

## 2020-05-05 LAB — COMPREHENSIVE METABOLIC PANEL
ALT: 25 U/L (ref 0–44)
AST: 37 U/L (ref 15–41)
Albumin: 2.8 g/dL — ABNORMAL LOW (ref 3.5–5.0)
Alkaline Phosphatase: 54 U/L (ref 38–126)
Anion gap: 8 (ref 5–15)
BUN: 12 mg/dL (ref 8–23)
CO2: 30 mmol/L (ref 22–32)
Calcium: 8.1 mg/dL — ABNORMAL LOW (ref 8.9–10.3)
Chloride: 97 mmol/L — ABNORMAL LOW (ref 98–111)
Creatinine, Ser: 0.54 mg/dL — ABNORMAL LOW (ref 0.61–1.24)
GFR calc Af Amer: >60 mL/min (ref >60)
GFR calc non Af Amer: >60 mL/min (ref >60)
Glucose, Bld: 144 mg/dL — ABNORMAL HIGH (ref 70–99)
Potassium: 3.6 mmol/L (ref 3.5–5.1)
Sodium: 135 mmol/L (ref 135–145)
Total Bilirubin: 0.6 mg/dL (ref 0.3–1.2)
Total Protein: 5.7 g/dL — ABNORMAL LOW (ref 6.5–8.1)

## 2020-05-05 LAB — CBC
HCT: 24.4 % — ABNORMAL LOW (ref 39.0–52.0)
HCT: 25.4 % — ABNORMAL LOW (ref 39.0–52.0)
Hemoglobin: 8.3 g/dL — ABNORMAL LOW (ref 13.0–17.0)
Hemoglobin: 8.6 g/dL — ABNORMAL LOW (ref 13.0–17.0)
MCH: 31.8 pg (ref 26.0–34.0)
MCH: 31.9 pg (ref 26.0–34.0)
MCHC: 34 g/dL (ref 30.0–36.0)
MCHC: 33.9 g/dL (ref 30.0–36.0)
MCV: 93.5 fL (ref 80.0–100.0)
MCV: 94.1 fL (ref 80.0–100.0)
Platelets: 170 10*3/uL (ref 150–400)
Platelets: 177 10*3/uL (ref 150–400)
RBC: 2.61 MIL/uL — ABNORMAL LOW (ref 4.22–5.81)
RBC: 2.7 MIL/uL — ABNORMAL LOW (ref 4.22–5.81)
RDW: 15.3 % (ref 11.5–15.5)
RDW: 15.2 % (ref 11.5–15.5)
WBC: 8.6 10*3/uL (ref 4.0–10.5)
WBC: 9.4 10*3/uL (ref 4.0–10.5)
nRBC: 0 % (ref 0.0–0.2)
nRBC: 0 % (ref 0.0–0.2)

## 2020-05-05 LAB — PHOSPHORUS: Phosphorus: 1.5 mg/dL — ABNORMAL LOW (ref 2.5–4.6)

## 2020-05-05 LAB — BASIC METABOLIC PANEL
Anion gap: 8 (ref 5–15)
BUN: 18 mg/dL (ref 8–23)
CO2: 30 mmol/L (ref 22–32)
Calcium: 8.2 mg/dL — ABNORMAL LOW (ref 8.9–10.3)
Chloride: 98 mmol/L (ref 98–111)
Creatinine, Ser: 0.51 mg/dL — ABNORMAL LOW (ref 0.61–1.24)
GFR calc Af Amer: >60 mL/min (ref >60)
GFR calc non Af Amer: >60 mL/min (ref >60)
Glucose, Bld: 115 mg/dL — ABNORMAL HIGH (ref 70–99)
Potassium: 3.3 mmol/L — ABNORMAL LOW (ref 3.5–5.1)
Sodium: 136 mmol/L (ref 135–145)

## 2020-05-05 LAB — URINE CULTURE: Culture: NO GROWTH

## 2020-05-05 LAB — GLUCOSE, CAPILLARY
Glucose-Capillary: 73 mg/dL (ref 70–99)
Glucose-Capillary: 109 mg/dL — ABNORMAL HIGH (ref 70–99)
Glucose-Capillary: 157 mg/dL — ABNORMAL HIGH (ref 70–99)
Glucose-Capillary: 64 mg/dL — ABNORMAL LOW (ref 70–99)
Glucose-Capillary: 98 mg/dL (ref 70–99)
Glucose-Capillary: 137 mg/dL — ABNORMAL HIGH (ref 70–99)

## 2020-05-05 LAB — MAGNESIUM: Magnesium: 1.5 mg/dL — ABNORMAL LOW (ref 1.7–2.4)

## 2020-05-05 MED ORDER — LORAZEPAM 2 MG/ML IJ SOLN
1.0000 mg | INTRAMUSCULAR | Status: DC | PRN
  Administered 2020-05-06: 17:00:00 2 mg via INTRAVENOUS
  Administered 2020-05-06: 18:00:00 3 mg via INTRAVENOUS
  Filled 2020-05-05: qty 2
  Filled 2020-05-05: qty 1

## 2020-05-05 MED ORDER — FOLIC ACID 1 MG PO TABS
1.0000 mg | ORAL_TABLET | Freq: Every day | ORAL | Status: DC
  Administered 2020-05-05 – 2020-05-07 (×3): 1 mg via ORAL
  Filled 2020-05-05 (×3): qty 1

## 2020-05-05 MED ORDER — OXYCODONE HCL 5 MG PO TABS: 2.5000 mg | ORAL_TABLET | ORAL | 0 refills | Status: DC | PRN

## 2020-05-05 MED ORDER — TRAMADOL HCL 50 MG PO TABS
50.0000 mg | ORAL_TABLET | Freq: Four times a day (QID) | ORAL | 0 refills | Status: DC | PRN
Start: 2020-05-05 — End: 2021-02-05

## 2020-05-05 MED ORDER — ADULT MULTIVITAMIN W/MINERALS CH
1.0000 | ORAL_TABLET | Freq: Every day | ORAL | Status: DC
  Administered 2020-05-06 – 2020-05-07 (×2): 1 via ORAL
  Filled 2020-05-05 (×2): qty 1

## 2020-05-05 MED ORDER — THIAMINE HCL 100 MG/ML IJ SOLN: 100.0000 mg | Freq: Every day | INTRAMUSCULAR | Status: DC

## 2020-05-05 MED ORDER — LORAZEPAM 1 MG PO TABS: 1.0000 mg | ORAL_TABLET | ORAL | Status: DC | PRN

## 2020-05-05 MED ORDER — POTASSIUM CHLORIDE CRYS ER 20 MEQ PO TBCR
40.0000 meq | EXTENDED_RELEASE_TABLET | ORAL | Status: DC
  Administered 2020-05-05: 10:00:00 40 meq via ORAL
  Filled 2020-05-05: qty 2

## 2020-05-05 MED ORDER — ENOXAPARIN SODIUM 40 MG/0.4ML ~~LOC~~ SOLN: 40.0000 mg | SUBCUTANEOUS | 0 refills | Status: DC

## 2020-05-05 MED ORDER — THIAMINE HCL 100 MG PO TABS
100.0000 mg | ORAL_TABLET | Freq: Every day | ORAL | Status: DC
  Administered 2020-05-06 – 2020-05-07 (×2): 100 mg via ORAL
  Filled 2020-05-05 (×2): qty 1

## 2020-05-05 NOTE — Progress Notes (Signed)
  Subjective: 2 Days Post-Op Procedure(s) (LRB): INTRAMEDULLARY (IM) NAIL INTERTROCHANTRIC (Left) Patient reports pain as mild to moderate.   Patient is well, and has had no acute complaints or problems Plan is to go home versus Rehab after hospital stay. Negative for chest pain and shortness of breath Fever: no Gastrointestinal: Negative for nausea and vomiting  Objective: Vital signs in last 24 hours: Temp:  [97.7 F (36.5 C)-98.7 F (37.1 C)] 98 F (36.7 C) (09/19 0750) Pulse Rate:  [64-86] 64 (09/19 0800) Resp:  [16-22] 17 (09/19 0421) BP: (115-151)/(69-81) 125/81 (09/19 0800) SpO2:  [94 %-98 %] 94 % (09/19 0800)  Intake/Output from previous day:  Intake/Output Summary (Last 24 hours) at 05/05/2020 1101 Last data filed at 05/05/2020 0910 Gross per 24 hour  Intake 1879.14 ml  Output 805 ml  Net 1074.14 ml    Intake/Output this shift: Total I/O In: 941.5 [I.V.:941.5] Out: -   Labs: Recent Labs    05/03/20 0501 05/04/20 0555 05/05/20 0607  HGB 12.5* 10.1* 8.3*   Recent Labs    05/04/20 0555 05/05/20 0607  WBC 13.0* 8.6  RBC 3.19* 2.61*  HCT 29.5* 24.4*  PLT 219 170   Recent Labs    05/04/20 0555 05/05/20 0607  NA 137 136  K 3.8 3.3*  CL 99 98  CO2 25 30  BUN 17 18  CREATININE 0.88 0.51*  GLUCOSE 99 115*  CALCIUM 8.2* 8.2*   No results for input(s): LABPT, INR in the last 72 hours.   EXAM General - Patient is Alert and Oriented Extremity - Neurovascular intact Sensation intact distally Dorsiflexion/Plantar flexion intact  Compartments soft Dressing/Incision - clean, dry, no drainage Motor Function - intact, moving foot and toes well on exam.  Ambulated 2 feet with physical therapy.  Past Medical History:  Diagnosis Date  . Diabetes mellitus without complication ([place])   . GERD (gastroesophageal reflux disease)   . Hypertension     Assessment/Plan: 2 Days Post-Op Procedure(s) (LRB): INTRAMEDULLARY (IM) NAIL INTERTROCHANTRIC  (Left) Principal Problem:   Intertrochanteric fracture of left femur (HCC) Active Problems:   Anxiety   Hypertension   Alcohol abuse   Leukocytosis   Fall   COVID-19 virus detected   Intertrochanteric fracture of left hip (HCC)   Closed displaced intertrochanteric fracture of left femur (HCC)   Alcohol use with withdrawal delirium   Closed left hip fracture (HCC)  Estimated body mass index is 18.46 kg/m as calculated from the following:   Height as of this encounter: 5\' 9"  (1.753 m).   Weight as of this encounter: 56.7 kg. Advance diet Up with therapy D/C IV fluids  Follow-up at [facility] orthopedics in 2 weeks after discharge. Prescriptions written for discharge.  DVT Prophylaxis - Lovenox, Foot Pumps and TED hose Toe-touch flatfoot Weight-Bearing to left leg  Reche Dixon, PA-C Orthopaedic Surgery 05/05/2020, 11:01 AM

## 2020-05-05 NOTE — Progress Notes (Signed)
PROGRESS NOTE    Derrick Jones  VXB:[id number] DOB: [birth year] DOA: 05/02/2020 PCP: Derrick [place], MD   Brief Narrative:  Derrick Jones is a 74 y.o. male with medical history significant for alcohol abuse, GERD hypertension and anxiety disorder who presented to the ER for evaluation of pain in his left hip and difficulty with ambulation.  Patient had been drinking 1 night prior to his admission and around 9 PM that night he lost balance and fell in his yard.  He developed severe pain in his left hip and was unable to ambulate since then.  He was unable to bear weight on the lower extremity.  Labs reveal sodium 131, potassium 3.6, chloride 93, bicarb 21, BUN 14, creatinine 0.89, calcium 8.9, AST 104, ALT 43, white count 26.8, hemoglobin 13.2, hematocrit 38.3, MCV 91.4, RDW 14.6, platelet count 283. Chest x-ray reviewed by me shows chest x-ray reviewed by me shows no obvious infiltrate or effusion. Left hip x-ray shows comminuted intertrochanteric fracture of the left proximal femur with varus deformity and apex anterior angulation with foreshortening.  Signs of prior trauma to the left femur with healed left femoral fracture and evidence of previous intramedullary nailing, hardware not in place currently. Left knee x-ray shows no abnormality COVID-19 PCR test was positive.  Patient was admitted under hospitalist service and orthopedics were consulted.  Due to being asymptomatic and Covid positive, patient received monoclonal antibody.  He underwent intramedullary nailing of left femur with cephalomedullary device on 05/03/2020.  He did well postoperatively.  Preoperatively, he was withdrawing from alcohol.  He was treated with CIWA protocol and as needed Ativan.  He was seen by PT OT who recommended SNF.   Assessment & Plan:   Principal Problem:   Intertrochanteric fracture of left femur (HCC) Active Problems:   Anxiety   Hypertension   Alcohol abuse   Leukocytosis   Fall   COVID-19 virus  detected   Intertrochanteric fracture of left hip (HCC)   Closed displaced intertrochanteric fracture of left femur (HCC)   Alcohol use with withdrawal delirium   Closed left hip fracture (HCC)   Intertrochanteric fracture of left femur: Orthopedics on board.  Status post Intramedullary nailing of L femur with cephalomedullary device on 05/03/2020.  Doing well.  Management per orthopedics.  Acute alcohol withdrawal delirium: Patient much better.  Alert and oriented x2.  Very minimal fine tremors in upper extremities.  Upon chart review, it sounds like CIWA protocol and as needed Ativan fell through for some reason.  Will place orders again for CIWA protocol and as needed Ativan.  Continue multivitamin.  Stop IV fluids now that he is eating.  Leukocytosis/possible UTI:   Chest x-ray clear but UA shows positive nitrites.  Highly suggest UTI in a male patient.  Continue Rocephin.  Leukocytosis resolved.  Urine culture still pending.  COVID-19 virus detected, incidentally: Patient was screened in the emergency room and his COVID-19 PCR test is positive He is asymptomatic.  Lungs clear to auscultation.  Chest x-ray clear. Previous admitting hospitalist discussed with patient's wife Ms Derrick Jones who consents for him to receive monoclonal antibodies.  He received 1 dose of antibodies already.  He is on room air.  We will stop IV fluids.  Essential hypertension: Blood pressure controlled.  Continue atenolol.  Diabetes mellitus type II: Hold Metformin.  Blood sugar controlled.  Continue SSI.  DVT prophylaxis: enoxaparin (LOVENOX) injection 40 mg Start: 05/04/20 0800 SCDs Start: 05/03/20 1724   Code Status: Full  Code  Family Communication:  None present at bedside.  Called and updated patient's wife.  Status is: Inpatient  Remains inpatient appropriate because:Inpatient level of care appropriate due to severity of illness   Dispo: The patient is from: Home              Anticipated d/c is  to: Home              Anticipated d/c date is: 1-2 days              Patient currently is not medically stable to d/c.        Estimated body mass index is 18.46 kg/m as calculated from the following:   Height as of this encounter: 5\' 9"  (1.753 m).   Weight as of this encounter: 56.7 kg.      Nutritional status:  Nutrition Problem: Increased nutrient needs Etiology: post-op healing, catabolic illness ([id number])   Signs/Symptoms: estimated needs   Interventions: Refer to RD note for recommendations    Consultants:   Orthopedics  Procedures:  Intramedullary nailing of L femur with cephalomedullary device  Antimicrobials:  Anti-infectives (From admission, onward)   Start     Dose/Rate Route Frequency Ordered Stop   05/03/20 1045  cefTRIAXone (ROCEPHIN) 1 g in sodium chloride 0.9 % 100 mL IVPB        1 g 200 mL/hr over 30 Minutes Intravenous Every 24 hours 05/03/20 1030     05/03/20 0615  ceFAZolin (ANCEF) IVPB 1 g/50 mL premix        1 g 100 mL/hr over 30 Minutes Intravenous  Once 05/02/20 1636 05/03/20 1415         Subjective: Seen and examined.  Slightly confused compared to yesterday.  Denied any complaints.  Objective: Vitals:   05/05/20 0421 05/05/20 0750 05/05/20 0800 05/05/20 1145  BP: 123/75 (!) 151/80 125/81 130/68  Pulse: 65 72 64 64  Resp: 17   12  Temp: 97.7 F (36.5 C) 98 F (36.7 C)  97.9 F (36.6 C)  TempSrc: Oral Oral  Oral  SpO2: 96% 98% 94% 99%  Weight:      Height:        Intake/Output Summary (Last 24 hours) at 05/05/2020 1213 Last data filed at 05/05/2020 1151 Gross per 24 hour  Intake 1975.3 ml  Output 805 ml  Net 1170.3 ml   Filed Weights   05/02/20 0716  Weight: 56.7 kg    Examination:  General exam: Appears calm and comfortable  Respiratory system: Clear to auscultation. Respiratory effort normal. Cardiovascular system: S1 & S2 heard, RRR. No JVD, murmurs, rubs, gallops or clicks. No pedal  edema. Gastrointestinal system: Abdomen is nondistended, soft and nontender. No organomegaly or masses felt. Normal bowel sounds heard. Central nervous system: Alert and oriented x2. No focal neurological deficits. Extremities: Symmetric 5 x 5 power. Skin: No rashes, lesions or ulcers.    Data Reviewed: I have personally reviewed following labs and imaging studies  CBC: Recent Labs  Lab 05/02/20 0800 05/03/20 0501 05/04/20 0555 05/05/20 0607  WBC 26.8* 18.4* 13.0* 8.6  HGB 13.2 12.5* 10.1* 8.3*  HCT 38.3* 36.7* 29.5* 24.4*  MCV 91.4 92.7 92.5 93.5  PLT 283 226 219 160   Basic Metabolic Panel: Recent Labs  Lab 05/02/20 0800 05/03/20 0501 05/04/20 0555 05/05/20 0607  NA 131* 133* 137 136  K 3.6 3.6 3.8 3.3*  CL 93* 97* 99 98  CO2 21* 28 25 30   GLUCOSE 146* 95  99 115*  BUN 14 12 17 18   CREATININE 0.89 0.76 0.88 0.51*  CALCIUM 8.9 8.3* 8.2* 8.2*   GFR: Estimated Creatinine Clearance: 65 mL/min (A) (by C-G formula based on SCr of 0.51 mg/dL (L)). Liver Function Tests: Recent Labs  Lab 05/02/20 0800  AST 104*  ALT 43  ALKPHOS 41  BILITOT 1.4*  PROT 6.8  ALBUMIN 3.7   No results for input(s): LIPASE, AMYLASE in the last 168 hours. No results for input(s): AMMONIA in the last 168 hours. Coagulation Profile: Recent Labs  Lab 05/02/20 0800  INR 1.2   Cardiac Enzymes: No results for input(s): CKTOTAL, CKMB, CKMBINDEX, TROPONINI in the last 168 hours. BNP (last 3 results) No results for input(s): PROBNP in the last 8760 hours. HbA1C: Recent Labs    05/02/20 1235  HGBA1C 6.4*   CBG: Recent Labs  Lab 05/04/20 2157 05/04/20 2352 05/05/20 0420 05/05/20 0754 05/05/20 1147  GLUCAP 146* 214* 73 109* 157*   Lipid Profile: No results for input(s): CHOL, HDL, LDLCALC, TRIG, CHOLHDL, LDLDIRECT in the last 72 hours. Thyroid Function Tests: No results for input(s): TSH, T4TOTAL, FREET4, T3FREE, THYROIDAB in the last 72 hours. Anemia Panel: Recent Labs     05/04/20 0601  FERRITIN 233   Sepsis Labs: No results for input(s): PROCALCITON, LATICACIDVEN in the last 168 hours.  Recent Results (from the past 240 hour(s))  SARS Coronavirus 2 by RT PCR (hospital order, performed in [facility] hospital lab) Nasopharyngeal Nasopharyngeal Swab     Status: Abnormal   Collection Time: 05/02/20  8:00 AM   Specimen: Nasopharyngeal Swab  Result Value Ref Range Status   SARS Coronavirus 2 POSITIVE (A) NEGATIVE Final    Comment: RESULT CALLED TO, READ BACK BY AND VERIFIED WITH: STEPHANIE RUDD AT 9381 ON 05/02/2020 [place]. (NOTE) SARS-CoV-2 target nucleic acids are DETECTED  SARS-CoV-2 RNA is generally detectable in upper respiratory specimens  during the acute phase of infection.  Positive results are indicative  of the presence of the identified virus, but do not rule out bacterial infection or co-infection with other pathogens not detected by the test.  Clinical correlation with patient history and  other diagnostic information is necessary to determine patient infection status.  The expected result is negative.  Fact Sheet for Patients:   StrictlyIdeas.no   Fact Sheet for Healthcare Providers:   BankingDealers.co.za    This test is not yet approved or cleared by the [place] FDA and  has been authorized for detection and/or diagnosis of SARS-CoV-2 by FDA under an Emergency Use Authorization (EUA).  This EUA will remain in effect (meaning t his test can be used) for the duration of  the COVID-19 declaration under Section 564(b)(1) of the Act, 21 U.S.C. section 360-bbb-3(b)(1), unless the authorization is terminated or revoked sooner.  Performed at [facility], [address], [place], [place] [address]   Urine Culture     Status: None   Collection Time: 05/02/20  4:19 PM   Specimen: Urine, Random  Result Value Ref Range Status   Specimen Description   Final    URINE,  RANDOM Performed at [facility], [address] [place], [place] [address]    Special Requests   Final    NONE Performed at [facility], [address], [place], [place] [address]    Culture   Final    NO GROWTH Performed at [facility], [place] [address], [place], [place] [address]  Report Status 05/05/2020 FINAL  Final      Radiology Studies: DG Chest Port 1 View  Result Date: 05/04/2020 CLINICAL DATA:  COVID-19. EXAM: PORTABLE CHEST 1 VIEW COMPARISON:  May 02, 2020 FINDINGS: The heart, hila, and mediastinum are normal. No pneumothorax. No pulmonary nodules or masses. Minimal opacity in left base. No other acute abnormalities. IMPRESSION: Minimal opacity in left base could represent developing infiltrate versus atelectasis. No other acute interval changes or abnormalities. Electronically Signed   By: Dorise Bullion III M.D   On: 05/04/2020 11:00   DG HIP OPERATIVE UNILAT W OR W/O PELVIS LEFT  Result Date: 05/03/2020 CLINICAL DATA:  Intraoperative evaluation. EXAM: OPERATIVE LEFT HIP (WITH PELVIS IF PERFORMED)  VIEWS TECHNIQUE: Fluoroscopic spot image(s) were submitted for interpretation post-operatively. COMPARISON:  May 02, 2020 FINDINGS: A radiopaque intramedullary rod and compression screw device are seen within the proximal left femur. An acute fracture of the inter trochanteric region of the proximal left femur is also noted. There is gross anatomic alignment. There is no evidence of dislocation. IMPRESSION: Status post open reduction and internal fixation of the proximal left femur. Electronically Signed   By: Virgina Norfolk M.D.   On: 05/03/2020 16:43    Scheduled Meds: . atenolol  100 mg Oral Daily  . atorvastatin  20 mg Oral Daily  . docusate sodium  100 mg Oral BID  . enoxaparin (LOVENOX) injection  40 mg Subcutaneous Q24H  . feeding supplement (ENSURE ENLIVE)  237 mL Oral TID BM  . folic acid  1 mg Oral Daily  . insulin aspart   0-9 Units Subcutaneous Q4H  . melatonin  2.5 mg Oral QHS  . multivitamin with minerals  1 tablet Oral Daily  . multivitamin with minerals  1 tablet Oral Daily  . pantoprazole  40 mg Oral Daily  . thiamine  100 mg Oral Daily   Or  . thiamine  100 mg Intravenous Daily  . thiamine  100 mg Oral Daily   Or  . thiamine  100 mg Intravenous Daily   Continuous Infusions: . cefTRIAXone (ROCEPHIN)  IV Stopped (05/05/20 1022)  . famotidine (PEPCID) IV    . methocarbamol (ROBAXIN) IV       LOS: 3 days   Time spent: 29 minutes   Darliss Cheney, MD [facility]  05/05/2020, 12:13 PM   To contact the attending provider between 7A-7P or the covering provider during after hours 7P-7A, please log into the web site [email or web address].

## 2020-05-05 NOTE — Plan of Care (Signed)

## 2020-05-05 NOTE — Progress Notes (Signed)
Physical Therapy Treatment Patient Details Name: Derrick Jones MRN: [id number] DOB: June 22, 1946 Today's Date: 05/05/2020    History of Present Illness Derrick Jones is a 74 y.o. male with medical history significant for alcohol abuse, GERD hypertension and anxiety disorder who presented to the ER 9/16/21for evaluation of pain in his left hip and difficulty with ambulation.  Patient had been drinking 1 night prior to his admission and around 9 PM that night he lost balance and fell in his yard. Found to have intertrochanteric fracture of left femur and underwent intramedullary nailing 05/03/20. Toe touch weightbearing status with flat foot L LE (only pressure to rest foot, not support body). Patient found to be positive for COVID19 upon admission.    PT Comments    Pt ready for session.  On room air.  Participated in exercises as described below.  Bed mobility min/mod a x 1 with assist for LE.  Steady in sitting.  He is able to stand with min a x 1 for standing ex.  After seated rest, he is able to transfer to recliner at bedside with walker and min a x 1.  Cues for TTWB status and overall does well with mobility but fatigued with effort.  Remained in recliner.  Sats did decrease but finger monitor was changed out and readings were stable with new monitor.  Pt aware he needs to improve his mobility but unable to progress further today.   Follow Up Recommendations  SNF     Equipment Recommendations  Rolling walker with 5" wheels;3in1 (PT);Wheelchair (measurements PT);Wheelchair cushion (measurements PT)    Recommendations for Other Services       Precautions / Restrictions Precautions Precautions: Fall Restrictions Weight Bearing Restrictions: Yes LLE Weight Bearing: Touchdown weight bearing Other Position/Activity Restrictions: LLE Touchdown WB with flat foot (weight of leg for balance)    Mobility  Bed Mobility Overal bed mobility: Needs Assistance Bed Mobility: Supine to Sit      Supine to sit: HOB elevated;Min assist     General bed mobility comments: L LE management  Transfers Overall transfer level: Needs assistance Equipment used: Rolling walker (2 wheeled) Transfers: Sit to/from Stand Sit to Stand: Min assist            Ambulation/Gait Ambulation/Gait assistance: Min Web designer (Feet): 3 Feet Assistive device: Rolling walker (2 wheeled) Gait Pattern/deviations: Step-to pattern Gait velocity: very slow   General Gait Details: does well with WB status.   Stairs             Wheelchair Mobility    Modified Rankin (Stroke Patients Only)       Balance Overall balance assessment: Needs assistance Sitting-balance support: Feet supported;Bilateral upper extremity supported Sitting balance-Leahy Scale: Good Sitting balance - Comments: steady static balance at edge of bed, does fatigue quickly in chair and leans back on back support   Standing balance support: Bilateral upper extremity supported Standing balance-Leahy Scale: Poor Standing balance comment: BUE support on RW and MIN A for balance during mobility and static standing                            Cognition Arousal/Alertness: Awake/alert Behavior During Therapy: WFL for tasks assessed/performed Overall Cognitive Status: No family/caregiver present to determine baseline cognitive functioning  General Comments: alert and able to follow cuing      Exercises Other Exercises Other Exercises: supine ex  LLE - ankle pumps, SLR, heel slides, ab/add and quad stes   standing marches and SLR x 5    General Comments        Pertinent Vitals/Pain Pain Assessment: Faces Faces Pain Scale: Hurts little more Pain Location: left hip when attempting to flex Pain Descriptors / Indicators: Grimacing;Guarding Pain Intervention(s): Limited activity within patient's tolerance;Monitored during session    Home Living                       Prior Function            PT Goals (current goals can now be found in the care plan section) Progress towards PT goals: Progressing toward goals    Frequency    BID      PT Plan Current plan remains appropriate    Co-evaluation              AM-PAC PT "6 Clicks" Mobility   Outcome Measure  Help needed turning from your back to your side while in a flat bed without using bedrails?: A Little Help needed moving from lying on your back to sitting on the side of a flat bed without using bedrails?: A Little Help needed moving to and from a bed to a chair (including a wheelchair)?: A Little Help needed standing up from a chair using your arms (e.g., wheelchair or bedside chair)?: A Little Help needed to walk in hospital room?: A Lot Help needed climbing 3-5 steps with a railing? : Total 6 Click Score: 15    End of Session Equipment Utilized During Treatment: Gait belt Activity Tolerance: Patient tolerated treatment well Patient left: in chair;with call bell/phone within reach;with chair alarm set;with nursing/sitter in room Nurse Communication: Mobility status Pain - Right/Left: Left Pain - part of body: Hip     Time: 4665-9935 PT Time Calculation (min) (ACUTE ONLY): 25 min  Charges:  $Therapeutic Exercise: 8-22 mins                    Chesley Noon, PTA 05/05/20, 11:23 AM

## 2020-05-06 ENCOUNTER — Encounter: Payer: Self-pay | Admitting: Orthopedic Surgery

## 2020-05-06 LAB — CBC
HCT: 24.5 % — ABNORMAL LOW (ref 39.0–52.0)
Hemoglobin: 8.5 g/dL — ABNORMAL LOW (ref 13.0–17.0)
MCH: 31.4 pg (ref 26.0–34.0)
MCHC: 34.7 g/dL (ref 30.0–36.0)
MCV: 90.4 fL (ref 80.0–100.0)
Platelets: 167 10*3/uL (ref 150–400)
RBC: 2.71 MIL/uL — ABNORMAL LOW (ref 4.22–5.81)
RDW: 15.2 % (ref 11.5–15.5)
WBC: 7.8 10*3/uL (ref 4.0–10.5)
nRBC: 0 % (ref 0.0–0.2)

## 2020-05-06 LAB — BASIC METABOLIC PANEL
Anion gap: 13 (ref 5–15)
BUN: 8 mg/dL (ref 8–23)
CO2: 29 mmol/L (ref 22–32)
Calcium: 8.6 mg/dL — ABNORMAL LOW (ref 8.9–10.3)
Chloride: 92 mmol/L — ABNORMAL LOW (ref 98–111)
Creatinine, Ser: 0.55 mg/dL — ABNORMAL LOW (ref 0.61–1.24)
GFR calc Af Amer: >60 mL/min (ref >60)
GFR calc non Af Amer: >60 mL/min (ref >60)
Glucose, Bld: 94 mg/dL (ref 70–99)
Potassium: 4.1 mmol/L (ref 3.5–5.1)
Sodium: 134 mmol/L — ABNORMAL LOW (ref 135–145)

## 2020-05-06 LAB — GLUCOSE, CAPILLARY
Glucose-Capillary: 109 mg/dL — ABNORMAL HIGH (ref 70–99)
Glucose-Capillary: 119 mg/dL — ABNORMAL HIGH (ref 70–99)
Glucose-Capillary: 120 mg/dL — ABNORMAL HIGH (ref 70–99)
Glucose-Capillary: 122 mg/dL — ABNORMAL HIGH (ref 70–99)
Glucose-Capillary: 122 mg/dL — ABNORMAL HIGH (ref 70–99)
Glucose-Capillary: 91 mg/dL (ref 70–99)

## 2020-05-06 MED ORDER — CHLORDIAZEPOXIDE HCL 25 MG PO CAPS
25.0000 mg | ORAL_CAPSULE | Freq: Three times a day (TID) | ORAL | Status: DC
  Administered 2020-05-06 – 2020-05-07 (×3): 25 mg via ORAL
  Filled 2020-05-06 (×2): qty 1

## 2020-05-06 MED ORDER — OMEPRAZOLE 40 MG PO CPDR: 40.0000 mg | DELAYED_RELEASE_CAPSULE | Freq: Two times a day (BID) | ORAL | 1 refills | Status: DC

## 2020-05-06 NOTE — Plan of Care (Signed)

## 2020-05-06 NOTE — NC FL2 (Signed)
[place] LEVEL OF CARE SCREENING TOOL     IDENTIFICATION  Patient Name: Derrick Jones Birthdate: 06-17-1946 Sex: male Admission Date (Current Location): 05/02/2020  [place] and [place] Number:  Engineering geologist and Address:  [facility], [address], [place],  [address]      Provider Number: [id number]  Attending Physician Name and Address:  Darliss Cheney, MD  Relative Name and Phone Number:  Khup Sapia (wife) [phone]    Current Level of Care: Hospital Recommended Level of Care: [place] Prior Approval Number:    Date Approved/Denied:   PASRR Number:    Discharge Plan: SNF    Current Diagnoses: Patient Active Problem List   Diagnosis Date Noted  . Closed left hip fracture ([place]) 05/03/2020  . Intertrochanteric fracture of left femur ([place]) 05/02/2020  . Leukocytosis 05/02/2020  . Fall 05/02/2020  . COVID-19 virus detected 05/02/2020  . Intertrochanteric fracture of left hip ([place]) 05/02/2020  . Closed displaced intertrochanteric fracture of left femur ([place]) 05/02/2020  . Alcohol use with withdrawal delirium 05/02/2020  . Alcohol abuse 02/27/2020  . Concussion wth loss of consciousness of 30 minutes or less 02/27/2020  . Alcoholic ketoacidosis 71/01/2693  . History of alcohol abuse 08/02/2017  . Hypercholesterolemia 08/02/2017  . Heartburn 07/02/2017  . Prediabetes 05/27/2017  . Adjustment disorder with mixed anxiety and depressed mood 05/11/2017  . Anxiety 05/11/2017  . Hypertension 05/11/2017  . Insomnia 05/11/2017  . History of gastric ulcer 05/11/2017  . History of esophageal stricture 05/11/2017    Orientation RESPIRATION BLADDER Height & Weight     Self, Time, Situation, Place  Normal Continent Weight: 56.7 kg Height:  5\' 9"  (175.3 cm)  BEHAVIORAL SYMPTOMS/MOOD NEUROLOGICAL BOWEL NUTRITION STATUS      Continent Diet (heart healthy)  AMBULATORY STATUS COMMUNICATION OF NEEDS Skin    Limited Assist Verbally Surgical wounds (incision left hip)                       Personal Care Assistance Level of Assistance  Bathing, Feeding, Dressing Bathing Assistance: Limited assistance Feeding assistance: Independent Dressing Assistance: Limited assistance     Functional Limitations Info             SPECIAL CARE FACTORS FREQUENCY  PT (By licensed PT), OT (By licensed OT)     PT Frequency: 5 times per week OT Frequency: 5 times per week            Contractures Contractures Info: Not present    Additional Factors Info  Code Status, Allergies, Isolation Precautions Code Status Info: Full Allergies Info: NKA     Isolation Precautions Info: COVID     Current Medications (05/06/2020):  This is the current hospital active medication list Current Facility-Administered Medications  Medication Dose Route Frequency Provider Last Rate Last Admin  . albuterol (PROVENTIL) (2.5 MG/3ML) 0.083% nebulizer solution 2.5 mg  2.5 mg Inhalation Q4H PRN Leim Fabry, MD      . atenolol (TENORMIN) tablet 100 mg  100 mg Oral Daily Leim Fabry, MD   100 mg at 05/06/20 1018  . atorvastatin (LIPITOR) tablet 20 mg  20 mg Oral Daily Leim Fabry, MD   20 mg at 05/06/20 1017  . bisacodyl (DULCOLAX) suppository 10 mg  10 mg Rectal Daily PRN Leim Fabry, MD      . cefTRIAXone (ROCEPHIN) 1 g in sodium chloride 0.9 % 100 mL IVPB  1 g Intravenous Q24H  Leim Fabry, MD 200 mL/hr at 05/06/20 1021 1 g at 05/06/20 1021  . diphenhydrAMINE (BENADRYL) injection 50 mg  50 mg Intravenous Once PRN Leim Fabry, MD      . docusate sodium (COLACE) capsule 100 mg  100 mg Oral BID Leim Fabry, MD   100 mg at 05/06/20 1017  . enoxaparin (LOVENOX) injection 40 mg  40 mg Subcutaneous Q24H Leim Fabry, MD   40 mg at 05/06/20 1021  . EPINEPHrine (EPI-PEN) injection 0.3 mg  0.3 mg Intramuscular Once PRN Leim Fabry, MD      . famotidine (PEPCID) IVPB 20 mg premix  20 mg Intravenous Once PRN Leim Fabry, MD      . feeding supplement (ENSURE ENLIVE) (ENSURE ENLIVE) liquid 237 mL  237 mL Oral TID BM Darliss Cheney, MD   237 mL at 05/05/20 2140  . folic acid (FOLVITE) tablet 1 mg  1 mg Oral Daily Pahwani, Ravi, MD   1 mg at 05/06/20 1017  . HYDROmorphone (DILAUDID) injection 0.25-0.5 mg  0.25-0.5 mg Intravenous Q2H PRN Leim Fabry, MD      . insulin aspart (novoLOG) injection 0-9 Units  0-9 Units Subcutaneous Q4H Leim Fabry, MD   1 Units at 05/06/20 0042  . LORazepam (ATIVAN) tablet 1-4 mg  1-4 mg Oral Q1H PRN Darliss Cheney, MD       Or  . LORazepam (ATIVAN) injection 1-4 mg  1-4 mg Intravenous Q1H PRN Darliss Cheney, MD      . melatonin tablet 2.5 mg  2.5 mg Oral QHS Leim Fabry, MD   2.5 mg at 05/05/20 2141  . methocarbamol (ROBAXIN) tablet 500 mg  500 mg Oral Q6H PRN Leim Fabry, MD       Or  . methocarbamol (ROBAXIN) 500 mg in dextrose 5 % 50 mL IVPB  500 mg Intravenous Q6H PRN Leim Fabry, MD      . methylPREDNISolone sodium succinate (SOLU-MEDROL) 125 mg/2 mL injection 125 mg  125 mg Intravenous Once PRN Leim Fabry, MD      . metoCLOPramide (REGLAN) tablet 5-10 mg  5-10 mg Oral Q8H PRN Leim Fabry, MD       Or  . metoCLOPramide (REGLAN) injection 5-10 mg  5-10 mg Intravenous Q8H PRN Leim Fabry, MD      . multivitamin with minerals tablet 1 tablet  1 tablet Oral Daily Darliss Cheney, MD   1 tablet at 05/06/20 1017  . ondansetron (ZOFRAN) tablet 4 mg  4 mg Oral Q6H PRN Leim Fabry, MD       Or  . ondansetron [facility]) injection 4 mg  4 mg Intravenous Q6H PRN Leim Fabry, MD      . oxyCODONE (Oxy IR/ROXICODONE) immediate release tablet 2.5-5 mg  2.5-5 mg Oral Q3H PRN Leim Fabry, MD      . oxyCODONE (Oxy IR/ROXICODONE) immediate release tablet 5-10 mg  5-10 mg Oral Q4H PRN Leim Fabry, MD   10 mg at 05/05/20 2141  . pantoprazole (PROTONIX) EC tablet 40 mg  40 mg Oral Daily Leim Fabry, MD   40 mg at 05/06/20 1017  . senna-docusate (Senokot-S) tablet 1 tablet  1 tablet Oral  QHS PRN Leim Fabry, MD      . sodium phosphate (FLEET) 7-19 GM/118ML enema 1 enema  1 enema Rectal Once PRN Leim Fabry, MD      . thiamine tablet 100 mg  100 mg Oral Daily Darliss Cheney, MD   100 mg at 05/06/20 1016  . traMADol (  ULTRAM) tablet 50 mg  50 mg Oral Q6H PRN Leim Fabry, MD         Discharge Medications: Please see discharge summary for a list of discharge medications.  Relevant Imaging Results:  Relevant Lab Results:   Additional Information SS# [id number]  Shelbie Hutching, RN

## 2020-05-06 NOTE — TOC Progression Note (Signed)
Transition of Care [facility]) - Progression Note    Patient Details  Name: Derrick Jones MRN: [id number] Date of Birth: 1946/06/16  Transition of Care [facility] CM/SW Contact  Shelbie Hutching, RN Phone Number: 05/06/2020, 2:22 PM  Clinical Narrative:    [facility] called and reported that they had an unexpected discharge for today.  RNCM started insurance authorization at 1330.  Hopefully patient will be approved today.    Expected Discharge Plan: Skilled Nursing Facility Barriers to Discharge: Insurance Authorization  Expected Discharge Plan and Services Expected Discharge Plan: [place] Choice: [place] arrangements for the past 2 months: Single Family Home Expected Discharge Date: 05/06/20                         HH Arranged: NA           Social Determinants of Health (SDOH) Interventions    Readmission Risk Interventions No flowsheet data found.

## 2020-05-06 NOTE — TOC Progression Note (Signed)
Transition of Care [facility]) - Progression Note    Patient Details  Name: Derrick Jones MRN: [id number] Date of Birth: 24-Jul-1946  Transition [facility] CM/SW Contact  Shelbie Hutching, RN Phone Number: 05/06/2020, 12:12 PM  Clinical Narrative:    Patient has agreed to go for short term rehab and would like to go to [facility]  [facility] does not have a bed today but maybe tomorrow or Wed.  Wife notified of updated discharge plan to SNF.   Expected Discharge Plan: Skilled Nursing Facility Barriers to Discharge: SNF Pending bed offer  Expected Discharge Plan and Services Expected Discharge Plan: [place] Choice: [place] arrangements for the past 2 months: Single Family Home Expected Discharge Date: 05/06/20                         [facility] Arranged: NA           Social Determinants of Health (SDOH) Interventions    Readmission Risk Interventions No flowsheet data found.

## 2020-05-06 NOTE — Progress Notes (Signed)
Physical Therapy Treatment Patient Details Name: Derrick Jones MRN: [id number] DOB: 1946/01/25 Today's Date: 05/06/2020    History of Present Illness Derrick Jones is a 74 y.o. male with medical history significant for alcohol abuse, GERD hypertension and anxiety disorder who presented to the ER 9/16/21for evaluation of pain in his left hip and difficulty with ambulation.  Patient had been drinking 1 night prior to his admission and around 9 PM that night he lost balance and fell in his yard. Found to have intertrochanteric fracture of left femur and underwent intramedullary nailing 05/03/20. Toe touch weightbearing status with flat foot L LE (only pressure to rest foot, not support body). Patient found to be positive for COVID19 upon admission.    PT Comments    Pt received in supine position and agreeable to therapy.  Pt performed well with bed-level exercises.  Pt requested to stay in bed at conclusion of therapy.  Pt was able to perform 5x STS with no weight bearing placed under LLE.  Therapist placed foot under LLE to make sure pt was not placing weight through LE, and performed well.  Pt continued to perform exercises and was returned to bed.  All need placed within reach.  Current discharge plans to SNF remain appropriate at this time.  Pt will continue to benefit from skilled therapy in order to address deficits listed below.    Follow Up Recommendations  SNF     Equipment Recommendations  Rolling walker with 5" wheels;3in1 (PT);Wheelchair (measurements PT);Wheelchair cushion (measurements PT)    Recommendations for Other Services       Precautions / Restrictions Precautions Precautions: Fall Restrictions Weight Bearing Restrictions: Yes LLE Weight Bearing: Touchdown weight bearing Other Position/Activity Restrictions: LLE Touchdown WB with flat foot (weight of leg for balance)    Mobility  Bed Mobility Overal bed mobility: Needs Assistance Bed Mobility: Supine to Sit      Supine to sit: HOB elevated;Min assist Sit to supine: HOB elevated;Min assist   General bed mobility comments: Pt able to navigate LLE with use of BUE for support.  Transfers Overall transfer level: Needs assistance Equipment used: Rolling walker (2 wheeled) Transfers: Sit to/from Stand Sit to Stand: Min assist         General transfer comment: MIN A + Increased effort sit<>stand c L hand rail   Ambulation/Gait Ambulation/Gait assistance: Min assist   Assistive device: Rolling walker (2 wheeled) Gait Pattern/deviations: Step-to pattern     General Gait Details: does well with WB status.   Stairs             Wheelchair Mobility    Modified Rankin (Stroke Patients Only)       Balance Overall balance assessment: Needs assistance Sitting-balance support: Feet supported;Bilateral upper extremity supported Sitting balance-Leahy Scale: Good Sitting balance - Comments: steady static balance at edge of bed, does fatigue quickly in chair and leans back on back support   Standing balance support: Bilateral upper extremity supported Standing balance-Leahy Scale: Poor Standing balance comment: BUE support on RW and MIN A for balance during mobility and static standing                            Cognition Arousal/Alertness: Awake/alert Behavior During Therapy: WFL for tasks assessed/performed Overall Cognitive Status: No family/caregiver present to determine baseline cognitive functioning  General Comments: alert and able to follow cuing      Exercises General Exercises - Lower Extremity Ankle Circles/Pumps: AROM;Strengthening;Both;10 reps;Supine Quad Sets: AROM;Strengthening;Both;10 reps;Supine Gluteal Sets: AROM;Strengthening;Both;10 reps;Supine Long Arc Quad: AROM;Strengthening;Both;10 reps;Seated Heel Slides: AROM;Right;AAROM;Left;Strengthening;10 reps;Supine Hip ABduction/ADduction:  AROM;Right;AAROM;Left;Strengthening;10 reps;Supine Straight Leg Raises: AROM;Right;AAROM;Left;Strengthening;10 reps;Supine Other Exercises Other Exercises: Pt sit to stand, x5 at bed. Other Exercises: Tooth brushing, LBD, sup<>sit, sit<>stand, sitting/standing balance/tolerance, functional reach    General Comments        Pertinent Vitals/Pain Pain Assessment: No/denies pain Pain Score: 9  Pain Location: Left Hip Pain Descriptors / Indicators: Grimacing;Guarding Pain Intervention(s): Limited activity within patient's tolerance;Monitored during session    Home Living                      Prior Function            PT Goals (current goals can now be found in the care plan section) Acute Rehab PT Goals Patient Stated Goal: get better Progress towards PT goals: Progressing toward goals    Frequency    BID      PT Plan Current plan remains appropriate    Co-evaluation              AM-PAC PT "6 Clicks" Mobility   Outcome Measure  Help needed turning from your back to your side while in a flat bed without using bedrails?: A Little Help needed moving from lying on your back to sitting on the side of a flat bed without using bedrails?: A Little Help needed moving to and from a bed to a chair (including a wheelchair)?: A Little Help needed standing up from a chair using your arms (e.g., wheelchair or bedside chair)?: A Little Help needed to walk in hospital room?: A Lot Help needed climbing 3-5 steps with a railing? : Total 6 Click Score: 15    End of Session Equipment Utilized During Treatment: Gait belt Activity Tolerance: Patient tolerated treatment well Patient left: in chair;with call bell/phone within reach;with chair alarm set;with nursing/sitter in room Nurse Communication: Mobility status PT Visit Diagnosis: Unsteadiness on feet (R26.81);History of falling (Z91.81);Muscle weakness (generalized) (M62.81);Pain Pain - Right/Left: Left Pain - part of  body: Hip     Time: 0932-6712 PT Time Calculation (min) (ACUTE ONLY): 15 min  Charges:  $Therapeutic Exercise: 8-22 mins                      Gwenlyn Saran, PT, DPT 05/06/20, 4:26 PM

## 2020-05-06 NOTE — Progress Notes (Signed)
PROGRESS NOTE    [name]  QPY:[id number] DOB: [birth year] DOA: 05/02/2020 PCP: Cletis Athens, MD   Brief Narrative:  Derrick Jones is a 74 y.o. male with medical history significant for alcohol abuse, GERD hypertension and anxiety disorder who presented to the ER for evaluation of pain in his left hip and difficulty with ambulation.  Patient had been drinking 1 night prior to his admission and around 9 PM that night he lost balance and fell in his yard.  He developed severe pain in his left hip and was unable to ambulate since then.  He was unable to bear weight on the lower extremity.  Labs reveal sodium 131, potassium 3.6, chloride 93, bicarb 21, BUN 14, creatinine 0.89, calcium 8.9, AST 104, ALT 43, white count 26.8, hemoglobin 13.2, hematocrit 38.3, MCV 91.4, RDW 14.6, platelet count 283. Chest x-ray reviewed by me shows chest x-ray reviewed by me shows no obvious infiltrate or effusion. Left hip x-ray shows comminuted intertrochanteric fracture of the left proximal femur with varus deformity and apex anterior angulation with foreshortening.  Signs of prior trauma to the left femur with healed left femoral fracture and evidence of previous intramedullary nailing, hardware not in place currently. Left knee x-ray shows no abnormality COVID-19 PCR test was positive.  Patient was admitted under hospitalist service and orthopedics were consulted.  Due to being asymptomatic and Covid positive, patient received monoclonal antibody.  He underwent intramedullary nailing of left femur with cephalomedullary device on 05/03/2020.  He did well postoperatively.  Preoperatively, he was withdrawing from alcohol.  He was treated with CIWA protocol and as needed Ativan.  He was seen by PT OT who recommended SNF.   Assessment & Plan:   Principal Problem:   Intertrochanteric fracture of left femur (HCC) Active Problems:   Anxiety   Hypertension   Alcohol abuse   Leukocytosis   Fall   COVID-19 virus  detected   Intertrochanteric fracture of left hip (HCC)   Closed displaced intertrochanteric fracture of left femur (HCC)   Alcohol use with withdrawal delirium   Closed left hip fracture (HCC)   Intertrochanteric fracture of left femur: Orthopedics on board.  Status post Intramedullary nailing of L femur with cephalomedullary device on 05/03/2020.  Doing well.  Management per orthopedics.  Acute alcohol withdrawal delirium: Patient much better.  Alert and oriented x3.  Very minimal fine tremors in upper extremities.  Upon chart review, his CIWA has remained less than 5 and he has not required any as needed Ativan.   Leukocytosis/possible UTI:   Chest x-ray clear but UA shows positive nitrites.  Highly suggest UTI in a male patient.  Continue Rocephin for total of 5 days, last dose tomorrow.  Leukocytosis resolved.  Urine culture negative so far.  COVID-19 virus detected, incidentally: Patient was screened in the emergency room and his COVID-19 PCR test is positive He is asymptomatic.  Lungs clear to auscultation.  Chest x-ray clear. Previous admitting hospitalist discussed with patient's wife Ms Derrick Jones who consents for him to receive monoclonal antibodies.  He received 1 dose of antibodies already.  He is on room air.    Essential hypertension: Blood pressure controlled.  Continue atenolol.  Diabetes mellitus type II: Hold Metformin.  Blood sugar controlled.  Continue SSI.  DVT prophylaxis: enoxaparin (LOVENOX) injection 40 mg Start: 05/04/20 0800 SCDs Start: 05/03/20 1724   Code Status: Full Code  Family Communication:  None present at bedside.  Called his wife and left a  voicemail.  Status is: Inpatient  Remains inpatient appropriate because:Inpatient level of care appropriate due to severity of illness   Dispo: The patient is from: Home              Anticipated d/c is to: Home              Anticipated d/c date is: 05/07/2020, per TOC, no Covid bed available at SNF placed  today.  Most likely tomorrow.              Patient currently is medically stable to d/c.        Estimated body mass index is 18.46 kg/m as calculated from the following:   Height as of this encounter: 5\' 9"  (1.753 m).   Weight as of this encounter: 56.7 kg.      Nutritional status:  Nutrition Problem: Increased nutrient needs Etiology: post-op healing, catabolic illness ([id number]-06)   Signs/Symptoms: estimated needs   Interventions: Refer to RD note for recommendations    Consultants:   Orthopedics  Procedures:  Intramedullary nailing of L femur with cephalomedullary device  Antimicrobials:  Anti-infectives (From admission, onward)   Start     Dose/Rate Route Frequency Ordered Stop   05/03/20 1045  cefTRIAXone (ROCEPHIN) 1 g in sodium chloride 0.9 % 100 mL IVPB        1 g 200 mL/hr over 30 Minutes Intravenous Every 24 hours 05/03/20 1030     05/03/20 0615  ceFAZolin (ANCEF) IVPB 1 g/50 mL premix        1 g 100 mL/hr over 30 Minutes Intravenous  Once 05/02/20 1636 05/03/20 1415         Subjective: Seen and examined.  He is alert and oriented.  No complaints.  Objective: Vitals:   05/05/20 1645 05/05/20 2030 05/06/20 0432 05/06/20 0738  BP: 120/70  (!) 141/73 127/83  Pulse: 74  80 87  Resp: 18   16  Temp:  97.7 F (36.5 C) 98.1 F (36.7 C) 98.2 F (36.8 C)  TempSrc:  Oral Oral Oral  SpO2: 93%   93%  Weight:      Height:        Intake/Output Summary (Last 24 hours) at 05/06/2020 1044 Last data filed at 05/05/2020 1151 Gross per 24 hour  Intake 96.16 ml  Output --  Net 96.16 ml   Filed Weights   05/02/20 0716  Weight: 56.7 kg    Examination:  General exam: Appears calm and comfortable  Respiratory system: Clear to auscultation. Respiratory effort normal. Cardiovascular system: S1 & S2 heard, RRR. No JVD, murmurs, rubs, gallops or clicks. No pedal edema. Gastrointestinal system: Abdomen is nondistended, soft and nontender. No organomegaly  or masses felt. Normal bowel sounds heard. Central nervous system: Alert and oriented. No focal neurological deficits. Skin: No rashes, lesions or ulcers.  Psychiatry: Judgement and insight appear normal. Mood & affect appropriate.    Data Reviewed: I have personally reviewed following labs and imaging studies  CBC: Recent Labs  Lab 05/03/20 0501 05/04/20 0555 05/05/20 0607 05/05/20 1254 05/06/20 0447  WBC 18.4* 13.0* 8.6 9.4 7.8  HGB 12.5* 10.1* 8.3* 8.6* 8.5*  HCT 36.7* 29.5* 24.4* 25.4* 24.5*  MCV 92.7 92.5 93.5 94.1 90.4  PLT 226 219 170 177 237   Basic Metabolic Panel: Recent Labs  Lab 05/03/20 0501 05/04/20 0555 05/05/20 0607 05/05/20 1254 05/06/20 0447  NA 133* 137 136 135 134*  K 3.6 3.8 3.3* 3.6 4.1  CL 97* 99 98  97* 92*  CO2 28 25 30 30 29   GLUCOSE 95 99 115* 144* 94  BUN 12 17 18 12 8   CREATININE 0.76 0.88 0.51* 0.54* 0.55*  CALCIUM 8.3* 8.2* 8.2* 8.1* 8.6*  MG  --   --   --  1.5*  --   PHOS  --   --   --  1.5*  --    GFR: Estimated Creatinine Clearance: 65 mL/min (A) (by C-G formula based on SCr of 0.55 mg/dL (L)). Liver Function Tests: Recent Labs  Lab 05/02/20 0800 05/05/20 1254  AST 104* 37  ALT 43 25  ALKPHOS 41 54  BILITOT 1.4* 0.6  PROT 6.8 5.7*  ALBUMIN 3.7 2.8*   No results for input(s): LIPASE, AMYLASE in the last 168 hours. No results for input(s): AMMONIA in the last 168 hours. Coagulation Profile: Recent Labs  Lab 05/02/20 0800  INR 1.2   Cardiac Enzymes: No results for input(s): CKTOTAL, CKMB, CKMBINDEX, TROPONINI in the last 168 hours. BNP (last 3 results) No results for input(s): PROBNP in the last 8760 hours. HbA1C: No results for input(s): HGBA1C in the last 72 hours. CBG: Recent Labs  Lab 05/05/20 1730 05/05/20 2106 05/06/20 0000 05/06/20 0430 05/06/20 0737  GLUCAP 98 137* 122* 91 109*   Lipid Profile: No results for input(s): CHOL, HDL, LDLCALC, TRIG, CHOLHDL, LDLDIRECT in the last 72 hours. Thyroid  Function Tests: No results for input(s): TSH, T4TOTAL, FREET4, T3FREE, THYROIDAB in the last 72 hours. Anemia Panel: Recent Labs    05/04/20 0601  FERRITIN 233   Sepsis Labs: No results for input(s): PROCALCITON, LATICACIDVEN in the last 168 hours.  Recent Results (from the past 240 hour(s))  SARS Coronavirus 2 by RT PCR (hospital order, performed in [facility] hospital lab) Nasopharyngeal Nasopharyngeal Swab     Status: Abnormal   Collection Time: 05/02/20  8:00 AM   Specimen: Nasopharyngeal Swab  Result Value Ref Range Status   SARS Coronavirus 2 POSITIVE (A) NEGATIVE Final    Comment: RESULT CALLED TO, READ BACK BY AND VERIFIED WITH: STEPHANIE RUDD AT 8250 ON 05/02/2020 [place]. (NOTE) SARS-CoV-2 target nucleic acids are DETECTED  SARS-CoV-2 RNA is generally detectable in upper respiratory specimens  during the acute phase of infection.  Positive results are indicative  of the presence of the identified virus, but do not rule out bacterial infection or co-infection with other pathogens not detected by the test.  Clinical correlation with patient history and  other diagnostic information is necessary to determine patient infection status.  The expected result is negative.  Fact Sheet for Patients:   StrictlyIdeas.no   Fact Sheet for Healthcare Providers:   BankingDealers.co.za    This test is not yet approved or cleared by the [place] FDA and  has been authorized for detection and/or diagnosis of SARS-CoV-2 by FDA under an Emergency Use Authorization (EUA).  This EUA will remain in effect (meaning t his test can be used) for the duration of  the COVID-19 declaration under Section 564(b)(1) of the Act, 21 U.S.C. section 360-bbb-3(b)(1), unless the authorization is terminated or revoked sooner.  Performed at [facility], [address], [place], [place] [address]   Urine Culture     Status: None   Collection  Time: 05/02/20  4:19 PM   Specimen: Urine, Random  Result Value Ref Range Status   Specimen Description   Final    URINE, RANDOM Performed at [facility], [place], [place]  [address]    Special Requests   Final    NONE Performed at [facility], [address] [place], [place] [address]    Culture   Final    NO GROWTH Performed at [facility], [place] [address], [place], [place] [address]    Report Status 05/05/2020 FINAL  Final      Radiology Studies: No results found.  Scheduled Meds: . atenolol  100 mg Oral Daily  . atorvastatin  20 mg Oral Daily  . docusate sodium  100 mg Oral BID  . enoxaparin (LOVENOX) injection  40 mg Subcutaneous Q24H  . feeding supplement (ENSURE ENLIVE)  237 mL Oral TID BM  . folic acid  1 mg Oral Daily  . insulin aspart  0-9 Units Subcutaneous Q4H  . melatonin  2.5 mg Oral QHS  . multivitamin with minerals  1 tablet Oral Daily  . pantoprazole  40 mg Oral Daily  . thiamine  100 mg Oral Daily   Continuous Infusions: . cefTRIAXone (ROCEPHIN)  IV 1 g (05/06/20 1021)  . famotidine (PEPCID) IV    . methocarbamol (ROBAXIN) IV       LOS: 4 days   Time spent: 27 minutes   Darliss Cheney, MD [facility]  05/06/2020, 10:44 AM   To contact the attending provider between 7A-7P or the covering provider during after hours 7P-7A, please log into the web site [email or web address].

## 2020-05-06 NOTE — Progress Notes (Signed)
Occupational Therapy Treatment Patient Details Name: Derrick Jones MRN: [id number] DOB: [birth year] Today's Date: 05/06/2020    History of present illness Derrick Jones is a 74 y.o. male with medical history significant for alcohol abuse, GERD hypertension and anxiety disorder who presented to the ER 9/16/21for evaluation of pain in his left hip and difficulty with ambulation.  Patient had been drinking 1 night prior to his admission and around 9 PM that night he lost balance and fell in his yard. Found to have intertrochanteric fracture of left femur and underwent intramedullary nailing 05/03/20. Toe touch weightbearing status with flat foot L LE (only pressure to rest foot, not support body). Patient found to be positive for COVID19 upon admission.   OT comments  Mr Bosket was seen for OT treatment on this date. Upon arrival to room pt reclined in bed reporting 7/10 pain in L hip - RN notified. Pt agreeable to tx. Pt currently requires MIN A sup<>sit c HOB elevated - pt utilizes pants/socks to lift L leg as he is unable to do SLR. CGA don/doff B socks seated EOB - extra time/effort 2/2 L hip pain. CGA + intermittent single UE support tooth brushing standing sinkside - minor LOBs noted t/o. Pt making good progress toward goals. Pt continues to benefit from skilled OT services to maximize return to PLOF and minimize risk of future falls, injury, caregiver burden, and readmission. Will continue to follow POC. Discharge recommendation remains appropriate.    Follow Up Recommendations  SNF    Equipment Recommendations  Other (comment) (TBD)    Recommendations for Other Services      Precautions / Restrictions Precautions Precautions: Fall Restrictions Weight Bearing Restrictions: Yes LLE Weight Bearing: Touchdown weight bearing Other Position/Activity Restrictions: LLE Touchdown WB with flat foot (weight of leg for balance)       Mobility Bed Mobility Overal bed mobility: Needs Assistance Bed  Mobility: Supine to Sit;Sit to Supine     Supine to sit: HOB elevated;Min assist Sit to supine: HOB elevated;Min assist      Transfers Overall transfer level: Needs assistance Equipment used: 1 person hand held assist Transfers: Sit to/from Stand Sit to Stand: Min assist         General transfer comment: MIN A + Increased effort sit<>stand c L hand rail     Balance Overall balance assessment: Needs assistance Sitting-balance support: Feet supported;No upper extremity supported Sitting balance-Leahy Scale: Good Sitting balance - Comments: fatigues quickly   Standing balance support: During functional activity;No upper extremity supported Standing balance-Leahy Scale: Fair Standing balance comment: Intermittent minor LOBs         ADL either performed or assessed with clinical judgement   ADL Overall ADL's : Needs assistance/impaired         General ADL Comments: CGA don/doff B socks seated EOB - extra time/effort 2/2 L hip pain. CGA + intermittent single UE support tooth brushing standing sinkside - minor LOBs noted t/o.                Cognition Arousal/Alertness: Awake/alert Behavior During Therapy: WFL for tasks assessed/performed Overall Cognitive Status: No family/caregiver present to determine baseline cognitive functioning        General Comments: follows commands, repeats questions        Exercises Exercises: Other exercises Other Exercises Other Exercises: Pt educated re: DME recs, HEP, pain mgmt strategies, ECS Other Exercises: Tooth brushing, LBD, sup<>sit, sit<>stand, sitting/standing balance/tolerance, functional reach  Pertinent Vitals/ Pain       Pain Assessment: 0-10 Pain Score: 7  Pain Location: Left Hip Pain Descriptors / Indicators: Grimacing;Guarding Pain Intervention(s): Limited activity within patient's tolerance;Premedicated before session;Repositioned;Patient requesting pain meds-RN notified   Frequency  Min 2X/week         Progress Toward Goals  OT Goals(current goals can now be found in the care plan section)  Progress towards OT goals: Progressing toward goals  Acute Rehab OT Goals Patient Stated Goal: get better OT Goal Formulation: With patient Time For Goal Achievement: 05/18/20 Potential to Achieve Goals: Good ADL Goals Pt Will Perform Grooming: with modified independence;sitting Pt Will Perform Lower Body Dressing: with min assist;sitting/lateral leans Pt Will Transfer to Toilet: with min assist;stand pivot transfer;bedside commode ( c LRAD PRN) Additional ADL Goal #1: Pt will Independently verbalize plan to implement x3 falls prevention strategies  Plan Discharge plan remains appropriate;Frequency remains appropriate    AM-PAC OT "6 Clicks" Daily Activity     Outcome Measure   Help from another person eating meals?: None Help from another person taking care of personal grooming?: A Little Help from another person toileting, which includes using toliet, bedpan, or urinal?: A Lot Help from another person bathing (including washing, rinsing, drying)?: A Lot Help from another person to put on and taking off regular upper body clothing?: A Little Help from another person to put on and taking off regular lower body clothing?: A Little 6 Click Score: 17    End of Session    OT Visit Diagnosis: Other abnormalities of gait and mobility (R26.89);Muscle weakness (generalized) (M62.81)   Activity Tolerance Patient tolerated treatment well   Patient Left in bed;with call bell/phone within reach;with bed alarm set   Nurse Communication Patient requests pain meds        Time: 8768-1157 OT Time Calculation (min): 20 min  Charges: OT General Charges $OT Visit: 1 Visit OT Treatments $Self Care/Home Management : 8-22 mins  Dessie Coma, M.S. OTR/L  05/06/20, 4:06 PM  ascom [phone]

## 2020-05-06 NOTE — Progress Notes (Signed)
Physical Therapy Treatment Patient Details Name: Derrick Jones MRN: [id number] DOB: Jan 09, 1946 Today's Date: 05/06/2020    History of Present Illness Derrick Jones is a 74 y.o. male with medical history significant for alcohol abuse, GERD hypertension and anxiety disorder who presented to the ER 9/16/21for evaluation of pain in his left hip and difficulty with ambulation.  Patient had been drinking 1 night prior to his admission and around 9 PM that night he lost balance and fell in his yard. Found to have intertrochanteric fracture of left femur and underwent intramedullary nailing 05/03/20. Toe touch weightbearing status with flat foot L LE (only pressure to rest foot, not support body). Patient found to be positive for COVID19 upon admission.    PT Comments    Pt received supine in bed with HOB elevated.  Pt performed bed-level exercises with AAROM necessary for LLE for some of therapeutic exercises.  Pt is making improvements towards goals and is gaining strength in BLE.  Pt moves well and abides by weight bearing restrictions without verbal cuing.  Pt able to navigate LLE with use of BUE to come seated EOB.  Pt then performed standing transfer to recliner.  Pt left in recliner with call bell, phone, and other necessities within arms reach.  Nursing notified of occluded IV and mobility status.  Current discharge plans to SNF remain appropriate at this time.  Pt will continue to benefit from skilled therapy in order to address deficits listed below.    Follow Up Recommendations  SNF     Equipment Recommendations  Rolling walker with 5" wheels;3in1 (PT);Wheelchair (measurements PT);Wheelchair cushion (measurements PT)    Recommendations for Other Services       Precautions / Restrictions Precautions Precautions: Fall Restrictions Weight Bearing Restrictions: Yes LLE Weight Bearing: Touchdown weight bearing Other Position/Activity Restrictions: LLE Touchdown WB with flat foot (weight of leg  for balance)    Mobility  Bed Mobility Overal bed mobility: Needs Assistance Bed Mobility: Supine to Sit     Supine to sit: HOB elevated;Min assist     General bed mobility comments: Pt able to navigate LLE with use of BUE for support.  Transfers Overall transfer level: Needs assistance Equipment used: Rolling walker (2 wheeled) Transfers: Sit to/from Stand Sit to Stand: Min assist            Ambulation/Gait Ambulation/Gait assistance: Min assist   Assistive device: Rolling walker (2 wheeled) Gait Pattern/deviations: Step-to pattern     General Gait Details: does well with WB status.   Stairs             Wheelchair Mobility    Modified Rankin (Stroke Patients Only)       Balance Overall balance assessment: Needs assistance Sitting-balance support: Feet supported;Bilateral upper extremity supported Sitting balance-Leahy Scale: Good Sitting balance - Comments: steady static balance at edge of bed, does fatigue quickly in chair and leans back on back support   Standing balance support: Bilateral upper extremity supported Standing balance-Leahy Scale: Poor Standing balance comment: BUE support on RW and MIN A for balance during mobility and static standing                            Cognition Arousal/Alertness: Awake/alert Behavior During Therapy: WFL for tasks assessed/performed Overall Cognitive Status: No family/caregiver present to determine baseline cognitive functioning  General Comments: alert and able to follow cuing      Exercises General Exercises - Lower Extremity Ankle Circles/Pumps: AROM;Strengthening;Both;10 reps;Supine Quad Sets: AROM;Strengthening;Both;10 reps;Supine Gluteal Sets: AROM;Strengthening;Both;10 reps;Supine Long Arc Quad: AROM;Strengthening;Both;10 reps;Seated Heel Slides: AROM;Right;AAROM;Left;Strengthening;10 reps;Supine Hip ABduction/ADduction:  AROM;Right;AAROM;Left;Strengthening;10 reps;Supine Straight Leg Raises: AROM;Right;AAROM;Left;Strengthening;10 reps;Supine    General Comments        Pertinent Vitals/Pain Pain Assessment: 0-10 Pain Score: 5  Pain Location: Left Hip Pain Descriptors / Indicators: Grimacing;Guarding Pain Intervention(s): Limited activity within patient's tolerance;Monitored during session;Premedicated before session    Home Living                      Prior Function            PT Goals (current goals can now be found in the care plan section) Progress towards PT goals: Progressing toward goals    Frequency    BID      PT Plan Current plan remains appropriate    Co-evaluation              AM-PAC PT "6 Clicks" Mobility   Outcome Measure  Help needed turning from your back to your side while in a flat bed without using bedrails?: A Little Help needed moving from lying on your back to sitting on the side of a flat bed without using bedrails?: A Little Help needed moving to and from a bed to a chair (including a wheelchair)?: A Little Help needed standing up from a chair using your arms (e.g., wheelchair or bedside chair)?: A Little Help needed to walk in hospital room?: A Lot Help needed climbing 3-5 steps with a railing? : Total 6 Click Score: 15    End of Session Equipment Utilized During Treatment: Gait belt Activity Tolerance: Patient tolerated treatment well Patient left: in chair;with call bell/phone within reach;with chair alarm set;with nursing/sitter in room Nurse Communication: Mobility status PT Visit Diagnosis: Unsteadiness on feet (R26.81);History of falling (Z91.81);Muscle weakness (generalized) (M62.81);Pain Pain - Right/Left: Left Pain - part of body: Hip     Time: 5093-2671 PT Time Calculation (min) (ACUTE ONLY): 15 min  Charges:  $Therapeutic Exercise: 8-22 mins                     Gwenlyn Saran, PT, DPT 05/06/20, 11:15 AM

## 2020-05-06 NOTE — Progress Notes (Signed)
Patient is starting to go through DT's, he scored a 14 and I  gave him 2mg  of Ativan. Messaged MD to let him be aware. Then around 1800 patient scored a 19 and he received 3 mg of Ativan and MD ordered Librium.

## 2020-05-07 DIAGNOSIS — F1010 Alcohol abuse, uncomplicated: Secondary | ICD-10-CM | POA: Diagnosis not present

## 2020-05-07 DIAGNOSIS — B9732 Oncovirus as the cause of diseases classified elsewhere: Secondary | ICD-10-CM | POA: Diagnosis not present

## 2020-05-07 DIAGNOSIS — S72142D Displaced intertrochanteric fracture of left femur, subsequent encounter for closed fracture with routine healing: Secondary | ICD-10-CM | POA: Diagnosis not present

## 2020-05-07 DIAGNOSIS — M6281 Muscle weakness (generalized): Secondary | ICD-10-CM | POA: Diagnosis not present

## 2020-05-07 DIAGNOSIS — U071 COVID-19: Secondary | ICD-10-CM | POA: Diagnosis not present

## 2020-05-07 DIAGNOSIS — F411 Generalized anxiety disorder: Secondary | ICD-10-CM | POA: Diagnosis not present

## 2020-05-07 DIAGNOSIS — F419 Anxiety disorder, unspecified: Secondary | ICD-10-CM | POA: Diagnosis not present

## 2020-05-07 DIAGNOSIS — W19XXXD Unspecified fall, subsequent encounter: Secondary | ICD-10-CM | POA: Diagnosis not present

## 2020-05-07 DIAGNOSIS — S060X1D Concussion with loss of consciousness of 30 minutes or less, subsequent encounter: Secondary | ICD-10-CM | POA: Diagnosis not present

## 2020-05-07 DIAGNOSIS — E782 Mixed hyperlipidemia: Secondary | ICD-10-CM | POA: Diagnosis not present

## 2020-05-07 DIAGNOSIS — K219 Gastro-esophageal reflux disease without esophagitis: Secondary | ICD-10-CM | POA: Diagnosis not present

## 2020-05-07 DIAGNOSIS — S72002S Fracture of unspecified part of neck of left femur, sequela: Secondary | ICD-10-CM | POA: Diagnosis not present

## 2020-05-07 DIAGNOSIS — M6259 Muscle wasting and atrophy, not elsewhere classified, multiple sites: Secondary | ICD-10-CM | POA: Diagnosis not present

## 2020-05-07 DIAGNOSIS — R7981 Abnormal blood-gas level: Secondary | ICD-10-CM | POA: Diagnosis not present

## 2020-05-07 DIAGNOSIS — S72145A Nondisplaced intertrochanteric fracture of left femur, initial encounter for closed fracture: Secondary | ICD-10-CM | POA: Diagnosis not present

## 2020-05-07 DIAGNOSIS — Z741 Need for assistance with personal care: Secondary | ICD-10-CM | POA: Diagnosis not present

## 2020-05-07 DIAGNOSIS — Z4789 Encounter for other orthopedic aftercare: Secondary | ICD-10-CM | POA: Diagnosis not present

## 2020-05-07 DIAGNOSIS — S72002D Fracture of unspecified part of neck of left femur, subsequent encounter for closed fracture with routine healing: Secondary | ICD-10-CM | POA: Diagnosis not present

## 2020-05-07 DIAGNOSIS — R41841 Cognitive communication deficit: Secondary | ICD-10-CM | POA: Diagnosis not present

## 2020-05-07 DIAGNOSIS — M25552 Pain in left hip: Secondary | ICD-10-CM | POA: Diagnosis not present

## 2020-05-07 DIAGNOSIS — R2681 Unsteadiness on feet: Secondary | ICD-10-CM | POA: Diagnosis not present

## 2020-05-07 DIAGNOSIS — I10 Essential (primary) hypertension: Secondary | ICD-10-CM | POA: Diagnosis not present

## 2020-05-07 DIAGNOSIS — F10931 Alcohol use, unspecified with withdrawal delirium: Secondary | ICD-10-CM | POA: Diagnosis not present

## 2020-05-07 DIAGNOSIS — R278 Other lack of coordination: Secondary | ICD-10-CM | POA: Diagnosis not present

## 2020-05-07 LAB — CBC WITH DIFFERENTIAL/PLATELET
Abs Immature Granulocytes: 0.2 10*3/uL — ABNORMAL HIGH (ref 0.00–0.07)
Basophils Absolute: 0 10*3/uL (ref 0.0–0.1)
Basophils Relative: 0 %
Eosinophils Absolute: 0.1 10*3/uL (ref 0.0–0.5)
Eosinophils Relative: 2 %
HCT: 26.8 % — ABNORMAL LOW (ref 39.0–52.0)
Hemoglobin: 9.1 g/dL — ABNORMAL LOW (ref 13.0–17.0)
Immature Granulocytes: 4 %
Lymphocytes Relative: 31 %
Lymphs Abs: 1.6 10*3/uL (ref 0.7–4.0)
MCH: 31.6 pg (ref 26.0–34.0)
MCHC: 34 g/dL (ref 30.0–36.0)
MCV: 93.1 fL (ref 80.0–100.0)
Monocytes Absolute: 0.8 10*3/uL (ref 0.1–1.0)
Monocytes Relative: 15 %
Neutro Abs: 2.5 10*3/uL (ref 1.7–7.7)
Neutrophils Relative %: 48 %
Platelets: 188 10*3/uL (ref 150–400)
RBC: 2.88 MIL/uL — ABNORMAL LOW (ref 4.22–5.81)
RDW: 15.6 % — ABNORMAL HIGH (ref 11.5–15.5)
WBC: 5.2 10*3/uL (ref 4.0–10.5)
nRBC: 0.4 % — ABNORMAL HIGH (ref 0.0–0.2)

## 2020-05-07 LAB — BASIC METABOLIC PANEL
Anion gap: 10 (ref 5–15)
BUN: 7 mg/dL — ABNORMAL LOW (ref 8–23)
CO2: 32 mmol/L (ref 22–32)
Calcium: 8.7 mg/dL — ABNORMAL LOW (ref 8.9–10.3)
Chloride: 90 mmol/L — ABNORMAL LOW (ref 98–111)
Creatinine, Ser: 0.57 mg/dL — ABNORMAL LOW (ref 0.61–1.24)
GFR calc Af Amer: >60 mL/min (ref >60)
GFR calc non Af Amer: >60 mL/min (ref >60)
Glucose, Bld: 108 mg/dL — ABNORMAL HIGH (ref 70–99)
Potassium: 4.3 mmol/L (ref 3.5–5.1)
Sodium: 132 mmol/L — ABNORMAL LOW (ref 135–145)

## 2020-05-07 LAB — GLUCOSE, CAPILLARY
Glucose-Capillary: 125 mg/dL — ABNORMAL HIGH (ref 70–99)
Glucose-Capillary: 140 mg/dL — ABNORMAL HIGH (ref 70–99)
Glucose-Capillary: 160 mg/dL — ABNORMAL HIGH (ref 70–99)
Glucose-Capillary: 92 mg/dL (ref 70–99)
Glucose-Capillary: 99 mg/dL (ref 70–99)

## 2020-05-07 LAB — MAGNESIUM: Magnesium: 1.5 mg/dL — ABNORMAL LOW (ref 1.7–2.4)

## 2020-05-07 MED ORDER — MAGNESIUM SULFATE 4 GM/100ML IV SOLN
4.0000 g | Freq: Once | INTRAVENOUS | Status: AC
  Administered 2020-05-07: 11:00:00 4 g via INTRAVENOUS
  Filled 2020-05-07: qty 100

## 2020-05-07 MED ORDER — OXYCODONE HCL 5 MG PO TABS: 2.5000 mg | ORAL_TABLET | ORAL | 0 refills | Status: DC | PRN

## 2020-05-07 MED ORDER — TRAMADOL HCL 50 MG PO TABS: 50.0000 mg | ORAL_TABLET | Freq: Four times a day (QID) | ORAL | Status: DC | PRN

## 2020-05-07 MED ORDER — OXYCODONE HCL 5 MG PO TABS: 2.5000 mg | ORAL_TABLET | ORAL | Status: DC | PRN

## 2020-05-07 MED ORDER — CHLORDIAZEPOXIDE HCL 10 MG PO CAPS
10.0000 mg | ORAL_CAPSULE | Freq: Three times a day (TID) | ORAL | 0 refills | Status: AC | PRN
Start: 2020-05-07 — End: 2020-05-09

## 2020-05-07 NOTE — Progress Notes (Signed)
Sheldon and gave report to Theodoro Parma LPN  [phone], patient is going to room  805 A

## 2020-05-07 NOTE — Discharge Summary (Addendum)
Physician Discharge Summary  Derrick Jones WJX:[id number] DOB: 11-Jul-1946 DOA: 05/02/2020  PCP: Cletis Athens, MD  Admit date: 05/02/2020 Discharge date: 05/07/2020  Admitted From: Home Disposition: SNF  Recommendations for Outpatient Follow-up:  1. Follow up with PCP in 1-2 weeks 2. Follow-up with orthopedics in 2 weeks 3. Please obtain BMP/CBC in one week 4. Please follow up with your PCP on the following pending results: Unresulted Labs (From admission, onward)         None       Home Health: None Equipment/Devices: None  Discharge Condition: Stable CODE STATUS: Full code Diet recommendation: Cardiac  Subjective: Seen and examined.  Feels much better.  Alert and oriented.  No withdrawal symptoms.  Brief/Interim Summary: Derrick Jones a 74 y.o.malewith medical history significant foralcohol abuse, GERD hypertension and anxiety disorder who presented to the ER for evaluation of pain in his left hip and difficulty with ambulation. Patient hadbeen drinking 1 night prior to his admission and around 9 PM that night he lost balance and fell in his yard. He developed severe pain in his left hip and was unable to ambulate since then. He was unable to bear weight on the lower extremity.  Labs reveal sodium 131, potassium 3.6, chloride 93, bicarb 21, BUN 14, creatinine 0.89, calcium 8.9, AST 104, ALT 43, white count 26.8, hemoglobin 13.2, hematocrit 38.3, MCV 91.4, RDW 14.6, platelet count 283. Chest x-ray reviewed by me shows chest x-ray reviewed by me shows no obvious infiltrate or effusion. Left hip x-ray shows comminuted intertrochanteric fracture of the left proximal femur with varus deformity and apex anterior angulation with foreshortening. Signs of prior trauma to the left femur with healed left femoral fracture and evidence of previous intramedullary nailing, hardware not in place currently. Left knee x-ray shows no abnormality COVID-19 PCR test was positive.  Patient was  admitted under hospitalist service and orthopedics were consulted.  Due to being asymptomatic and Covid positive, patient received monoclonal antibody.  He underwent intramedullary nailing of left femur with cephalomedullary device on 05/03/2020. Preoperatively, he was withdrawing from alcohol.  He was treated with CIWA protocol and as needed Ativan.  He was seen by PT OT who recommended SNF.  He did well up until yesterday afternoon when he had an episode of alcohol withdrawal where he required couple doses of IV Ativan and then he was started on Librium.  His last dose of as needed Ativan was yesterday around 6 PM.  When seen this morning, patient is completely alert and oriented.  He has no symptoms.  He has no withdrawal symptoms at all.  He is a stable for discharge.  Pain medications and Lovenox for DVT prophylaxis has been prescribed to him by orthopedics.  He was cleared by them and will follow with them in 2 weeks.  I am discharging him in 2 more days of Librium 10 mg p.o. 3 times daily.  His last drink of alcohol was 5 days ago so I do not suspect any further alcohol withdrawal.   Discharge Diagnoses:  Principal Problem:   Intertrochanteric fracture of left femur (HCC) Active Problems:   Anxiety   Hypertension   Alcohol abuse   Leukocytosis   Fall   COVID-19 virus detected   Intertrochanteric fracture of left hip (HCC)   Closed displaced intertrochanteric fracture of left femur (HCC)   Alcohol use with withdrawal delirium   Closed left hip fracture [facility])    Discharge Instructions  Discharge Instructions  Discharge patient   Complete by: As directed    Discharge disposition: 03-Skilled Nursing Facility   Discharge patient date: 05/07/2020     Allergies as of 05/07/2020   No Known Allergies     Medication List    TAKE these medications   atenolol 100 MG tablet Commonly known as: TENORMIN Take 100 mg by mouth daily.   atorvastatin 20 MG tablet Commonly known as:  LIPITOR Take 20 mg by mouth daily.   chlordiazePOXIDE 10 MG capsule Commonly known as: LIBRIUM Take 1 capsule (10 mg total) by mouth 3 (three) times daily as needed for up to 2 days for anxiety.   enoxaparin 40 MG/0.4ML injection Commonly known as: LOVENOX Inject 0.4 mLs (40 mg total) into the skin daily for 14 days.   melatonin 3 MG Tabs tablet Take 3 mg by mouth at bedtime.   metFORMIN 500 MG tablet Commonly known as: GLUCOPHAGE Take 1 tablet by mouth once daily   omeprazole 20 MG capsule Commonly known as: PRILOSEC Take 20 mg by mouth daily.   omeprazole 40 MG capsule Commonly known as: PRILOSEC Take 1 capsule (40 mg total) by mouth 2 (two) times daily before a meal.   oxyCODONE 5 MG immediate release tablet Commonly known as: Oxy IR/ROXICODONE Take 0.5-1 tablets (2.5-5 mg total) by mouth every 4 (four) hours as needed for moderate pain or severe pain.   traMADol 50 MG tablet Commonly known as: ULTRAM Take 1 tablet (50 mg total) by mouth every 6 (six) hours as needed for moderate pain.       Follow-up Information    Reche [place], [place]. Schedule an appointment as soon as possible for a visit in 2 week(s).   Specialty: Orthopedic Surgery Why: For left femur x-rays and staple removal Contact information: [address] [place] [address] [phone]        Cletis Athens, MD Follow up in 1 week(s).   Specialties: Internal Medicine, Cardiology Contact information: [place] [address] [phone]              No Known Allergies  Consultations: Orthopedics   Procedures/Studies: DG Chest 1 View  Result Date: 05/02/2020 CLINICAL DATA:  Fall, pain to LEFT knee. EXAM: CHEST  1 VIEW COMPARISON:  02/12/2020 FINDINGS: Cardiomediastinal contours and hilar structures are normal. No consolidation.  No pleural effusion. Nodular opacity projecting over the LEFT upper lobe, over the inferior border of the scapula.  Findings may represent confluence of shadows are small LEFT upper lobe nodule. No effusion. No acute skeletal process on limited assessment. IMPRESSION: 1. No acute cardiopulmonary disease. 2. Nodular opacity projecting over the LEFT upper lobe may represent confluence of shadows or small LEFT upper lobe nodule. Consider follow-up PA and lateral chest radiograph for further evaluation when the patient is able. Electronically Signed   By: Zetta Bills M.D.   On: 05/02/2020 08:11   DG Knee 1-2 Views Left  Result Date: 05/02/2020 CLINICAL DATA:  Trauma EXAM: LEFT KNEE - 1-2 VIEW COMPARISON:  None. FINDINGS: Physiologic alignment. No fracture. Tricompartmental joint space loss, subchondral sclerosis and osteophytosis predominantly involving the medial compartment. No joint effusion. Vascular calcifications. IMPRESSION: No acute osseous abnormality. Moderate medial predominant osteoarthrosis. Electronically Signed   By: Primitivo Gauze M.D.   On: 05/02/2020 08:04   DG Chest Port 1 View  Result Date: 05/04/2020 CLINICAL DATA:  COVID-19. EXAM: PORTABLE CHEST 1 VIEW COMPARISON:  May 02, 2020 FINDINGS: The heart, hila,  and mediastinum are normal. No pneumothorax. No pulmonary nodules or masses. Minimal opacity in left base. No other acute abnormalities. IMPRESSION: Minimal opacity in left base could represent developing infiltrate versus atelectasis. No other acute interval changes or abnormalities. Electronically Signed   By: Dorise Bullion III M.D   On: 05/04/2020 11:00   DG HIP OPERATIVE UNILAT W OR W/O PELVIS LEFT  Result Date: 05/03/2020 CLINICAL DATA:  Intraoperative evaluation. EXAM: OPERATIVE LEFT HIP (WITH PELVIS IF PERFORMED)  VIEWS TECHNIQUE: Fluoroscopic spot image(s) were submitted for interpretation post-operatively. COMPARISON:  May 02, 2020 FINDINGS: A radiopaque intramedullary rod and compression screw device are seen within the proximal left femur. An acute fracture of the  inter trochanteric region of the proximal left femur is also noted. There is gross anatomic alignment. There is no evidence of dislocation. IMPRESSION: Status post open reduction and internal fixation of the proximal left femur. Electronically Signed   By: Virgina Norfolk M.D.   On: 05/03/2020 16:43   DG Hip Unilat W or Wo Pelvis 2-3 Views Left  Result Date: 05/02/2020 CLINICAL DATA:  Fall, pain in LEFT hip and knee with positive shortening of the LEFT lower extremity EXAM: DG HIP (WITH OR WITHOUT PELVIS) 2-3V LEFT COMPARISON:  CT abdomen and pelvis of 2014 FINDINGS: Comminuted inter trochanteric fracture of the LEFT proximal femur with varus deformity and foreshortening. Signs of prior trauma to the LEFT femur. Previous imaging shows evidence of prior intramedullary nailing and there is expansion of the cortex and signs of prior fracture to the proximal femoral diaphysis. No fracture of the bony pelvis. RIGHT hip appears located on single view. Cross-table lateral shows apex anterior angulation at the site of LEFT femoral fracture. IMPRESSION: 1. Comminuted intertrochanteric fracture of the LEFT proximal femur with varus deformity and apex anterior angulation with foreshortening. 2. Signs of prior trauma to the LEFT femur with healed LEFT femoral fracture and evidence of previous intramedullary nailing, hardware not in place currently. Areas incompletely imaged on the current study. Electronically Signed   By: Zetta Bills M.D.   On: 05/02/2020 08:08   DG Hip Unilat W or Wo Pelvis 2-3 Views Right  Result Date: 05/02/2020 CLINICAL DATA:  Fall, left hip pain EXAM: DG HIP (WITH OR WITHOUT PELVIS) 2-3V RIGHT COMPARISON:  None. FINDINGS: There is a comminuted fracture through the left femoral intertrochanteric region with varus angulation. No subluxation or dislocation. No acute bony abnormality within the right hip. Hip joints and SI joints are symmetric. IMPRESSION: Left femoral intertrochanteric fracture  with varus angulation and comminution. Electronically Signed   By: Rolm Baptise M.D.   On: 05/02/2020 10:39   DG Femur Min 2 Views Left  Result Date: 05/02/2020 CLINICAL DATA:  Fall, pain EXAM: LEFT FEMUR 2 VIEWS COMPARISON:  None. FINDINGS: There is a left femoral intertrochanteric fracture. Mildly comminuted with varus angulation. No subluxation or dislocation. Deformity in the mid femur likely related to old healed fracture. IMPRESSION: Left intertrochanteric fracture with varus angulation. Electronically Signed   By: Rolm Baptise M.D.   On: 05/02/2020 10:41     Discharge Exam: Vitals:   05/07/20 0835 05/07/20 1148  BP: (!) 150/84 119/66  Pulse: 80 63  Resp: 17 17  Temp: 97.7 F (36.5 C) 98.1 F (36.7 C)  SpO2: 96% 94%   Vitals:   05/07/20 0501 05/07/20 0600 05/07/20 0835 05/07/20 1148  BP: 118/79  (!) 150/84 119/66  Pulse: 70  80 63  Resp: 16  17 17  Temp: 97.9 F (36.6 C)  97.7 F (36.5 C) 98.1 F (36.7 C)  TempSrc:   Oral   SpO2: 99% 99% 96% 94%  Weight:      Height:        General: Pt is alert, awake, not in acute distress Cardiovascular: RRR, S1/S2 +, no rubs, no gallops Respiratory: CTA bilaterally, no wheezing, no rhonchi Abdominal: Soft, NT, ND, bowel sounds + Extremities: no edema, no cyanosis    The results of significant diagnostics from this hospitalization (including imaging, microbiology, ancillary and laboratory) are listed below for reference.     Microbiology: Recent Results (from the past 240 hour(s))  SARS Coronavirus 2 by RT PCR (hospital order, performed in [facility] hospital lab) Nasopharyngeal Nasopharyngeal Swab     Status: Abnormal   Collection Time: 05/02/20  8:00 AM   Specimen: Nasopharyngeal Swab  Result Value Ref Range Status   SARS Coronavirus 2 POSITIVE (A) NEGATIVE Final    Comment: RESULT CALLED TO, READ BACK BY AND VERIFIED WITH: STEPHANIE RUDD AT 8588 ON 05/02/2020 [place]. (NOTE) SARS-CoV-2 target nucleic acids are  DETECTED  SARS-CoV-2 RNA is generally detectable in upper respiratory specimens  during the acute phase of infection.  Positive results are indicative  of the presence of the identified virus, but do not rule out bacterial infection or co-infection with other pathogens not detected by the test.  Clinical correlation with patient history and  other diagnostic information is necessary to determine patient infection status.  The expected result is negative.  Fact Sheet for Patients:   StrictlyIdeas.no   Fact Sheet for Healthcare Providers:   BankingDealers.co.za    This test is not yet approved or cleared by the [place] FDA and  has been authorized for detection and/or diagnosis of SARS-CoV-2 by FDA under an Emergency Use Authorization (EUA).  This EUA will remain in effect (meaning t his test can be used) for the duration of  the COVID-19 declaration under Section 564(b)(1) of the Act, 21 U.S.C. section 360-bbb-3(b)(1), unless the authorization is terminated or revoked sooner.  Performed at [facility], [address], [place], [place] [address]   Urine Culture     Status: None   Collection Time: 05/02/20  4:19 PM   Specimen: Urine, Random  Result Value Ref Range Status   Specimen Description   Final    URINE, RANDOM Performed at [facility], [address], [place], [place] [address]    Special Requests   Final    NONE Performed at [facility], [address] [place], [place] [address]    Culture   Final    NO GROWTH Performed at [facility], [place] [address] [place], [place] [address]    Report Status 05/05/2020 FINAL  Final     Labs: BNP (last 3 results) No results for input(s): BNP in the last 8760 hours. Basic Metabolic Panel: Recent Labs  Lab 05/04/20 0555 05/05/20 0607 05/05/20 1254 05/06/20 0447 05/07/20 0757  NA 137 136 135 134* 132*  K 3.8 3.3* 3.6 4.1 4.3  CL  99 98 97* 92* 90*  CO2 25 30 30 29  32  GLUCOSE 99 115* 144* 94 108*  BUN 17 18 12 8  7*  CREATININE 0.88 0.51* 0.54* 0.55* 0.57*  CALCIUM 8.2* 8.2* 8.1* 8.6* 8.7*  MG  --   --  1.5*  --  1.5*  PHOS  --   --  1.5*  --   --  Liver Function Tests: Recent Labs  Lab 05/02/20 0800 05/05/20 1254  AST 104* 37  ALT 43 25  ALKPHOS 41 54  BILITOT 1.4* 0.6  PROT 6.8 5.7*  ALBUMIN 3.7 2.8*   No results for input(s): LIPASE, AMYLASE in the last 168 hours. No results for input(s): AMMONIA in the last 168 hours. CBC: Recent Labs  Lab 05/04/20 0555 05/05/20 0607 05/05/20 1254 05/06/20 0447 05/07/20 0757  WBC 13.0* 8.6 9.4 7.8 5.2  NEUTROABS  --   --   --   --  2.5  HGB 10.1* 8.3* 8.6* 8.5* 9.1*  HCT 29.5* 24.4* 25.4* 24.5* 26.8*  MCV 92.5 93.5 94.1 90.4 93.1  PLT 219 170 177 167 188   Cardiac Enzymes: No results for input(s): CKTOTAL, CKMB, CKMBINDEX, TROPONINI in the last 168 hours. BNP: Invalid input(s): POCBNP CBG: Recent Labs  Lab 05/06/20 2339 05/07/20 0050 05/07/20 0502 05/07/20 0837 05/07/20 1149  GLUCAP 160* 125* 99 92 140*   D-Dimer No results for input(s): DDIMER in the last 72 hours. Hgb A1c No results for input(s): HGBA1C in the last 72 hours. Lipid Profile No results for input(s): CHOL, HDL, LDLCALC, TRIG, CHOLHDL, LDLDIRECT in the last 72 hours. Thyroid function studies No results for input(s): TSH, T4TOTAL, T3FREE, THYROIDAB in the last 72 hours.  Invalid input(s): FREET3 Anemia work up No results for input(s): VITAMINB12, FOLATE, FERRITIN, TIBC, IRON, RETICCTPCT in the last 72 hours. Urinalysis    Component Value Date/Time   COLORURINE AMBER (A) 05/02/2020 1619   APPEARANCEUR CLEAR (A) 05/02/2020 1619   APPEARANCEUR Clear 02/08/2013 1549   LABSPEC 1.017 05/02/2020 1619   LABSPEC 1.015 02/08/2013 1549   PHURINE 6.0 05/02/2020 1619   GLUCOSEU NEGATIVE 05/02/2020 1619   GLUCOSEU Negative 02/08/2013 1549   HGBUR NEGATIVE 05/02/2020 1619    BILIRUBINUR NEGATIVE 05/02/2020 1619   BILIRUBINUR Negative 02/08/2013 1549   KETONESUR NEGATIVE 05/02/2020 1619   PROTEINUR NEGATIVE 05/02/2020 1619   NITRITE POSITIVE (A) 05/02/2020 1619   LEUKOCYTESUR NEGATIVE 05/02/2020 1619   LEUKOCYTESUR Negative 02/08/2013 1549   Sepsis Labs Invalid input(s): PROCALCITONIN,  WBC,  LACTICIDVEN Microbiology Recent Results (from the past 240 hour(s))  SARS Coronavirus 2 by RT PCR (hospital order, performed in [facility] hospital lab) Nasopharyngeal Nasopharyngeal Swab     Status: Abnormal   Collection Time: 05/02/20  8:00 AM   Specimen: Nasopharyngeal Swab  Result Value Ref Range Status   SARS Coronavirus 2 POSITIVE (A) NEGATIVE Final    Comment: RESULT CALLED TO, READ BACK BY AND VERIFIED WITH: STEPHANIE RUDD AT 7829 ON 05/02/2020 [place]. (NOTE) SARS-CoV-2 target nucleic acids are DETECTED  SARS-CoV-2 RNA is generally detectable in upper respiratory specimens  during the acute phase of infection.  Positive results are indicative  of the presence of the identified virus, but do not rule out bacterial infection or co-infection with other pathogens not detected by the test.  Clinical correlation with patient history and  other diagnostic information is necessary to determine patient infection status.  The expected result is negative.  Fact Sheet for Patients:   StrictlyIdeas.no   Fact Sheet for Healthcare Providers:   BankingDealers.co.za    This test is not yet approved or cleared by the [place] FDA and  has been authorized for detection and/or diagnosis of SARS-CoV-2 by FDA under an Emergency Use Authorization (EUA).  This EUA will remain in effect (meaning t his test can be used) for the duration of  the COVID-19 declaration under Section 564(b)(1)  of the Act, 21 U.S.C. section 360-bbb-3(b)(1), unless the authorization is terminated or revoked sooner.  Performed at [facility], [address] [place], [place] [address]   Urine Culture     Status: None   Collection Time: 05/02/20  4:19 PM   Specimen: Urine, Random  Result Value Ref Range Status   Specimen Description   Final    URINE, RANDOM Performed at [facility], [address] [place], [place] [address]    Special Requests   Final    NONE Performed at [facility], [address] [place], [place] [address]    Culture   Final    NO GROWTH Performed at [facility], [place] [address] [place], [place] [address]    Report Status 05/05/2020 FINAL  Final     Time coordinating discharge: Over 30 minutes  SIGNED:   Darliss Cheney, MD  [facility] 05/07/2020, 12:30 PM  If 7PM-7AM, please contact night-coverage [email or web address]

## 2020-05-07 NOTE — Progress Notes (Signed)
Physical Therapy Treatment Patient Details Name: Derrick Jones MRN: [id number] DOB: [birth year] Today's Date: 05/07/2020    History of Present Illness Derrick Jones is a 74 y.o. male with medical history significant for alcohol abuse, GERD hypertension and anxiety disorder who presented to the ER 9/16/21for evaluation of pain in his left hip and difficulty with ambulation.  Patient had been drinking 1 night prior to his admission and around 9 PM that night he lost balance and fell in his yard. Found to have intertrochanteric fracture of left femur and underwent intramedullary nailing 05/03/20. Toe touch weightbearing status with flat foot L LE (only pressure to rest foot, not support body). Patient found to be positive for COVID19 upon admission.    PT Comments    Pt was long sitting in bed upon arriving. He agrees to PT session and is cooperative throughout. Does endorse 7/10 pain. RN entered room to give meds during session. Pt was able to exit R side of bed with min assist.Stood and ambulated with CGA while maintaining proper TDWB. Tolerated performing there ex well. Overall pt is improving and progressing towards PT goals. Current POC appropriate at DC. Pt wants to go home but is willing to go to rehab. Will benefit form continues skilled PT at DC to improve strength, balance, and safe functional mobility deficits. He was sitting in chair, with chair alarm in place, and call bell in reach at conclusion of session.     Follow Up Recommendations  SNF     Equipment Recommendations  Other (comment) (defer to next level of care)    Recommendations for Other Services       Precautions / Restrictions Precautions Precautions: Fall Restrictions Weight Bearing Restrictions: Yes LLE Weight Bearing: Touchdown weight bearing Other Position/Activity Restrictions: LLE Touchdown WB with flat foot (weight of leg for balance)    Mobility  Bed Mobility Overal bed mobility: Needs Assistance Bed Mobility:  Supine to Sit     Supine to sit: HOB elevated;Min assist     General bed mobility comments: Pt was able to exit R side of bed with min assist + increased time. vcs for improved sequencing and safety.  Transfers Overall transfer level: Needs assistance Equipment used: Rolling walker (2 wheeled) Transfers: Sit to/from Stand Sit to Stand: Min guard         General transfer comment: CGA for safety   Ambulation/Gait Ambulation/Gait assistance: Min guard Gait Distance (Feet): 45 Feet Assistive device: Rolling walker (2 wheeled) Gait Pattern/deviations: Step-to pattern (TDWB) Gait velocity: decreased   General Gait Details: does well with WB status.   Stairs Stairs:  (educated on proper stair navigation)         Balance Overall balance assessment: Needs assistance Sitting-balance support: Feet supported Sitting balance-Leahy Scale: Good Sitting balance - Comments: no LOB seated EOB. RN issued meds and pt was able to maintain balance without UE support   Standing balance support: Bilateral upper extremity supported;During functional activity Standing balance-Leahy Scale: Fair Standing balance comment: Pt does rely oin UE support to maintain balance           Cognition Arousal/Alertness: Awake/alert Behavior During Therapy: Flat affect Overall Cognitive Status: No family/caregiver present to determine baseline cognitive functioning                                 General Comments: alert and able to follow cuing      Exercises  General Exercises - Lower Extremity Ankle Circles/Pumps: AROM;Strengthening;Both;10 reps;Supine Quad Sets: AROM;Strengthening;Both;10 reps;Supine Gluteal Sets: AROM;Strengthening;Both;10 reps;Supine Long Arc Quad: AROM;Strengthening;Both;10 reps;Seated Heel Slides: AAROM;Left;Strengthening;10 reps;Supine Hip ABduction/ADduction: AAROM;Left;Strengthening;10 reps;Supine Straight Leg Raises: AAROM;Left;Strengthening;10  reps;Supine        Pertinent Vitals/Pain Pain Assessment: 0-10 Pain Score: 7  Faces Pain Scale: Hurts even more Pain Location: Left Hip Pain Descriptors / Indicators: Grimacing;Guarding Pain Intervention(s): Limited activity within patient's tolerance;Monitored during session;Repositioned           PT Goals (current goals can now be found in the care plan section) Acute Rehab PT Goals Patient Stated Goal: get better Progress towards PT goals: Progressing toward goals    Frequency    BID      PT Plan Current plan remains appropriate    Co-evaluation     PT goals addressed during session: Mobility/safety with mobility;Strengthening/ROM;Proper use of DME;Balance        AM-PAC PT "6 Clicks" Mobility   Outcome Measure  Help needed turning from your back to your side while in a flat bed without using bedrails?: A Little Help needed moving from lying on your back to sitting on the side of a flat bed without using bedrails?: A Little Help needed moving to and from a bed to a chair (including a wheelchair)?: A Little Help needed standing up from a chair using your arms (e.g., wheelchair or bedside chair)?: A Little Help needed to walk in hospital room?: A Little Help needed climbing 3-5 steps with a railing? : A Lot 6 Click Score: 17    End of Session Equipment Utilized During Treatment: Gait belt Activity Tolerance: Patient tolerated treatment well Patient left: in chair;with call bell/phone within reach;with chair alarm set;with nursing/sitter in room Nurse Communication: Mobility status PT Visit Diagnosis: Unsteadiness on feet (R26.81);History of falling (Z91.81);Muscle weakness (generalized) (M62.81);Pain Pain - Right/Left: Left Pain - part of body: Hip     Time: 8453-6468 PT Time Calculation (min) (ACUTE ONLY): 24 min  Charges:  $Gait Training: 8-22 mins $Therapeutic Exercise: 8-22 mins                     Julaine Fusi PTA 05/07/20, 9:50 AM

## 2020-05-07 NOTE — Care Management Important Message (Signed)
Important Message  Patient Details  Name: Derrick Jones MRN: [id number] Date of Birth: July 20, 1946   Medicare Important Message Given:  Yes  Reviewed with patient over room phone due to isolation status.  Declined copy of Medicare IM as one already mailed to home address by Admitting.     [name] 05/07/2020, 12:54 PM

## 2020-05-07 NOTE — TOC Transition Note (Signed)
Transition of Care [facility]) - CM/SW Discharge Note   Patient Details  Name: Derrick Jones MRN: [id number] Date of Birth: 06/12/1946  Transition of Care [facility]) CM/SW Contact:  Shelbie Hutching, RN Phone Number: 05/07/2020, 10:42 AM   Clinical Narrative:    Patient is medically cleared to discharge to [facility] for short term rehab today.  Patient will be going to room 805A and bedside RN will call report to [phone].  [employer] has approved for patient to go to SNF for 7 days start today, auth number is [id number].  [employer] denied auth for ambulance transport- wife notified of this and she and her daughter will be picking the patient up and transporting to facility.    Final next level of care: [place] Barriers to Discharge: Barriers Resolved   Patient Goals and CMS Choice Patient states their goals for this hospitalization and ongoing recovery are:: Patient agrees to SNF and is okay with [facility] CMS Medicare.gov Compare Post Acute Care list provided to:: Patient Choice offered to / list presented to : Patient  Discharge Placement              Patient chooses bed at: [facility] Patient to be transferred to facility by: Patient's wife will pick him up and transport Name of family member notified: Letha Patient and family notified of of transfer: 05/07/20  Discharge Plan and Services     Post Acute Care Choice: [place] Arranged: NA          Social Determinants of Health (SDOH) Interventions     Readmission Risk Interventions No flowsheet data found.

## 2020-05-08 DIAGNOSIS — W19XXXD Unspecified fall, subsequent encounter: Secondary | ICD-10-CM | POA: Diagnosis not present

## 2020-05-08 DIAGNOSIS — S72002D Fracture of unspecified part of neck of left femur, subsequent encounter for closed fracture with routine healing: Secondary | ICD-10-CM | POA: Diagnosis not present

## 2020-05-08 DIAGNOSIS — S72142D Displaced intertrochanteric fracture of left femur, subsequent encounter for closed fracture with routine healing: Secondary | ICD-10-CM | POA: Diagnosis not present

## 2020-05-08 DIAGNOSIS — M6259 Muscle wasting and atrophy, not elsewhere classified, multiple sites: Secondary | ICD-10-CM | POA: Diagnosis not present

## 2020-05-09 DIAGNOSIS — R2681 Unsteadiness on feet: Secondary | ICD-10-CM | POA: Diagnosis not present

## 2020-05-09 DIAGNOSIS — F411 Generalized anxiety disorder: Secondary | ICD-10-CM | POA: Diagnosis not present

## 2020-05-09 DIAGNOSIS — M6281 Muscle weakness (generalized): Secondary | ICD-10-CM | POA: Diagnosis not present

## 2020-05-09 DIAGNOSIS — S72002S Fracture of unspecified part of neck of left femur, sequela: Secondary | ICD-10-CM | POA: Diagnosis not present

## 2020-05-09 DIAGNOSIS — B9732 Oncovirus as the cause of diseases classified elsewhere: Secondary | ICD-10-CM | POA: Diagnosis not present

## 2020-05-09 DIAGNOSIS — E782 Mixed hyperlipidemia: Secondary | ICD-10-CM | POA: Diagnosis not present

## 2020-05-09 DIAGNOSIS — F1010 Alcohol abuse, uncomplicated: Secondary | ICD-10-CM | POA: Diagnosis not present

## 2020-05-09 DIAGNOSIS — K219 Gastro-esophageal reflux disease without esophagitis: Secondary | ICD-10-CM | POA: Diagnosis not present

## 2020-05-09 DIAGNOSIS — I10 Essential (primary) hypertension: Secondary | ICD-10-CM | POA: Diagnosis not present

## 2020-05-14 DIAGNOSIS — F411 Generalized anxiety disorder: Secondary | ICD-10-CM | POA: Diagnosis not present

## 2020-05-14 DIAGNOSIS — S72002D Fracture of unspecified part of neck of left femur, subsequent encounter for closed fracture with routine healing: Secondary | ICD-10-CM | POA: Diagnosis not present

## 2020-05-14 DIAGNOSIS — M6259 Muscle wasting and atrophy, not elsewhere classified, multiple sites: Secondary | ICD-10-CM | POA: Diagnosis not present

## 2020-05-14 DIAGNOSIS — R2681 Unsteadiness on feet: Secondary | ICD-10-CM | POA: Diagnosis not present

## 2020-05-14 DIAGNOSIS — I10 Essential (primary) hypertension: Secondary | ICD-10-CM | POA: Diagnosis not present

## 2020-05-14 DIAGNOSIS — K219 Gastro-esophageal reflux disease without esophagitis: Secondary | ICD-10-CM | POA: Diagnosis not present

## 2020-05-14 DIAGNOSIS — B9732 Oncovirus as the cause of diseases classified elsewhere: Secondary | ICD-10-CM | POA: Diagnosis not present

## 2020-05-14 DIAGNOSIS — F1010 Alcohol abuse, uncomplicated: Secondary | ICD-10-CM | POA: Diagnosis not present

## 2020-05-14 DIAGNOSIS — F10931 Alcohol use, unspecified with withdrawal delirium: Secondary | ICD-10-CM | POA: Diagnosis not present

## 2020-05-14 DIAGNOSIS — M6281 Muscle weakness (generalized): Secondary | ICD-10-CM | POA: Diagnosis not present

## 2020-05-14 DIAGNOSIS — U071 COVID-19: Secondary | ICD-10-CM | POA: Diagnosis not present

## 2020-05-14 DIAGNOSIS — E782 Mixed hyperlipidemia: Secondary | ICD-10-CM | POA: Diagnosis not present

## 2020-05-16 DIAGNOSIS — E782 Mixed hyperlipidemia: Secondary | ICD-10-CM | POA: Diagnosis not present

## 2020-05-16 DIAGNOSIS — K219 Gastro-esophageal reflux disease without esophagitis: Secondary | ICD-10-CM | POA: Diagnosis not present

## 2020-05-16 DIAGNOSIS — S72002D Fracture of unspecified part of neck of left femur, subsequent encounter for closed fracture with routine healing: Secondary | ICD-10-CM | POA: Diagnosis not present

## 2020-05-16 DIAGNOSIS — F411 Generalized anxiety disorder: Secondary | ICD-10-CM | POA: Diagnosis not present

## 2020-05-16 DIAGNOSIS — I10 Essential (primary) hypertension: Secondary | ICD-10-CM | POA: Diagnosis not present

## 2020-05-16 DIAGNOSIS — F1010 Alcohol abuse, uncomplicated: Secondary | ICD-10-CM | POA: Diagnosis not present

## 2020-05-16 DIAGNOSIS — B9732 Oncovirus as the cause of diseases classified elsewhere: Secondary | ICD-10-CM | POA: Diagnosis not present

## 2020-05-16 DIAGNOSIS — M6281 Muscle weakness (generalized): Secondary | ICD-10-CM | POA: Diagnosis not present

## 2020-05-16 DIAGNOSIS — R2681 Unsteadiness on feet: Secondary | ICD-10-CM | POA: Diagnosis not present

## 2020-05-20 DIAGNOSIS — S72002D Fracture of unspecified part of neck of left femur, subsequent encounter for closed fracture with routine healing: Secondary | ICD-10-CM | POA: Diagnosis not present

## 2020-05-20 DIAGNOSIS — K219 Gastro-esophageal reflux disease without esophagitis: Secondary | ICD-10-CM | POA: Diagnosis not present

## 2020-05-20 DIAGNOSIS — F411 Generalized anxiety disorder: Secondary | ICD-10-CM | POA: Diagnosis not present

## 2020-05-20 DIAGNOSIS — B9732 Oncovirus as the cause of diseases classified elsewhere: Secondary | ICD-10-CM | POA: Diagnosis not present

## 2020-05-20 DIAGNOSIS — I10 Essential (primary) hypertension: Secondary | ICD-10-CM | POA: Diagnosis not present

## 2020-05-20 DIAGNOSIS — R2681 Unsteadiness on feet: Secondary | ICD-10-CM | POA: Diagnosis not present

## 2020-05-20 DIAGNOSIS — E782 Mixed hyperlipidemia: Secondary | ICD-10-CM | POA: Diagnosis not present

## 2020-05-20 DIAGNOSIS — F1010 Alcohol abuse, uncomplicated: Secondary | ICD-10-CM | POA: Diagnosis not present

## 2020-05-20 DIAGNOSIS — M6281 Muscle weakness (generalized): Secondary | ICD-10-CM | POA: Diagnosis not present

## 2020-05-22 DIAGNOSIS — R2681 Unsteadiness on feet: Secondary | ICD-10-CM | POA: Diagnosis not present

## 2020-05-22 DIAGNOSIS — E782 Mixed hyperlipidemia: Secondary | ICD-10-CM | POA: Diagnosis not present

## 2020-05-22 DIAGNOSIS — F411 Generalized anxiety disorder: Secondary | ICD-10-CM | POA: Diagnosis not present

## 2020-05-22 DIAGNOSIS — F1010 Alcohol abuse, uncomplicated: Secondary | ICD-10-CM | POA: Diagnosis not present

## 2020-05-22 DIAGNOSIS — B9732 Oncovirus as the cause of diseases classified elsewhere: Secondary | ICD-10-CM | POA: Diagnosis not present

## 2020-05-22 DIAGNOSIS — K219 Gastro-esophageal reflux disease without esophagitis: Secondary | ICD-10-CM | POA: Diagnosis not present

## 2020-05-22 DIAGNOSIS — M6281 Muscle weakness (generalized): Secondary | ICD-10-CM | POA: Diagnosis not present

## 2020-05-22 DIAGNOSIS — S72002D Fracture of unspecified part of neck of left femur, subsequent encounter for closed fracture with routine healing: Secondary | ICD-10-CM | POA: Diagnosis not present

## 2020-05-22 DIAGNOSIS — I10 Essential (primary) hypertension: Secondary | ICD-10-CM | POA: Diagnosis not present

## 2020-05-23 DIAGNOSIS — M25552 Pain in left hip: Secondary | ICD-10-CM | POA: Diagnosis not present

## 2020-05-24 DIAGNOSIS — B9732 Oncovirus as the cause of diseases classified elsewhere: Secondary | ICD-10-CM | POA: Diagnosis not present

## 2020-05-24 DIAGNOSIS — F411 Generalized anxiety disorder: Secondary | ICD-10-CM | POA: Diagnosis not present

## 2020-05-24 DIAGNOSIS — E782 Mixed hyperlipidemia: Secondary | ICD-10-CM | POA: Diagnosis not present

## 2020-05-24 DIAGNOSIS — S72002D Fracture of unspecified part of neck of left femur, subsequent encounter for closed fracture with routine healing: Secondary | ICD-10-CM | POA: Diagnosis not present

## 2020-05-24 DIAGNOSIS — I10 Essential (primary) hypertension: Secondary | ICD-10-CM | POA: Diagnosis not present

## 2020-05-24 DIAGNOSIS — K219 Gastro-esophageal reflux disease without esophagitis: Secondary | ICD-10-CM | POA: Diagnosis not present

## 2020-05-24 DIAGNOSIS — F1010 Alcohol abuse, uncomplicated: Secondary | ICD-10-CM | POA: Diagnosis not present

## 2020-05-24 DIAGNOSIS — R2681 Unsteadiness on feet: Secondary | ICD-10-CM | POA: Diagnosis not present

## 2020-05-24 DIAGNOSIS — M6281 Muscle weakness (generalized): Secondary | ICD-10-CM | POA: Diagnosis not present

## 2020-05-29 DIAGNOSIS — G4700 Insomnia, unspecified: Secondary | ICD-10-CM | POA: Diagnosis not present

## 2020-05-29 DIAGNOSIS — Z8616 Personal history of COVID-19: Secondary | ICD-10-CM | POA: Diagnosis not present

## 2020-05-29 DIAGNOSIS — K5900 Constipation, unspecified: Secondary | ICD-10-CM | POA: Diagnosis not present

## 2020-05-29 DIAGNOSIS — M6259 Muscle wasting and atrophy, not elsewhere classified, multiple sites: Secondary | ICD-10-CM | POA: Diagnosis not present

## 2020-05-29 DIAGNOSIS — F4323 Adjustment disorder with mixed anxiety and depressed mood: Secondary | ICD-10-CM | POA: Diagnosis not present

## 2020-05-29 DIAGNOSIS — Z9181 History of falling: Secondary | ICD-10-CM | POA: Diagnosis not present

## 2020-05-29 DIAGNOSIS — I10 Essential (primary) hypertension: Secondary | ICD-10-CM | POA: Diagnosis not present

## 2020-05-29 DIAGNOSIS — E119 Type 2 diabetes mellitus without complications: Secondary | ICD-10-CM | POA: Diagnosis not present

## 2020-05-29 DIAGNOSIS — S72142D Displaced intertrochanteric fracture of left femur, subsequent encounter for closed fracture with routine healing: Secondary | ICD-10-CM | POA: Diagnosis not present

## 2020-05-29 DIAGNOSIS — F419 Anxiety disorder, unspecified: Secondary | ICD-10-CM | POA: Diagnosis not present

## 2020-05-29 DIAGNOSIS — Z7984 Long term (current) use of oral hypoglycemic drugs: Secondary | ICD-10-CM | POA: Diagnosis not present

## 2020-05-29 DIAGNOSIS — E785 Hyperlipidemia, unspecified: Secondary | ICD-10-CM | POA: Diagnosis not present

## 2020-06-04 DIAGNOSIS — E785 Hyperlipidemia, unspecified: Secondary | ICD-10-CM | POA: Diagnosis not present

## 2020-06-04 DIAGNOSIS — I10 Essential (primary) hypertension: Secondary | ICD-10-CM | POA: Diagnosis not present

## 2020-06-04 DIAGNOSIS — M6259 Muscle wasting and atrophy, not elsewhere classified, multiple sites: Secondary | ICD-10-CM | POA: Diagnosis not present

## 2020-06-04 DIAGNOSIS — G4700 Insomnia, unspecified: Secondary | ICD-10-CM | POA: Diagnosis not present

## 2020-06-04 DIAGNOSIS — K5900 Constipation, unspecified: Secondary | ICD-10-CM | POA: Diagnosis not present

## 2020-06-04 DIAGNOSIS — Z9181 History of falling: Secondary | ICD-10-CM | POA: Diagnosis not present

## 2020-06-04 DIAGNOSIS — Z8616 Personal history of COVID-19: Secondary | ICD-10-CM | POA: Diagnosis not present

## 2020-06-04 DIAGNOSIS — F419 Anxiety disorder, unspecified: Secondary | ICD-10-CM | POA: Diagnosis not present

## 2020-06-04 DIAGNOSIS — S72142D Displaced intertrochanteric fracture of left femur, subsequent encounter for closed fracture with routine healing: Secondary | ICD-10-CM | POA: Diagnosis not present

## 2020-06-04 DIAGNOSIS — E119 Type 2 diabetes mellitus without complications: Secondary | ICD-10-CM | POA: Diagnosis not present

## 2020-06-04 DIAGNOSIS — F4323 Adjustment disorder with mixed anxiety and depressed mood: Secondary | ICD-10-CM | POA: Diagnosis not present

## 2020-06-04 DIAGNOSIS — Z7984 Long term (current) use of oral hypoglycemic drugs: Secondary | ICD-10-CM | POA: Diagnosis not present

## 2020-06-05 ENCOUNTER — Encounter: Payer: Self-pay | Admitting: Gastroenterology

## 2020-06-05 ENCOUNTER — Other Ambulatory Visit: Payer: Self-pay

## 2020-06-05 ENCOUNTER — Ambulatory Visit: Payer: PPO | Admitting: Gastroenterology

## 2020-06-05 VITALS — BP 144/79 | HR 79 | Temp 97.7°F | Ht 68.0 in | Wt 123.0 lb

## 2020-06-05 DIAGNOSIS — F1010 Alcohol abuse, uncomplicated: Secondary | ICD-10-CM

## 2020-06-05 DIAGNOSIS — K5900 Constipation, unspecified: Secondary | ICD-10-CM | POA: Diagnosis not present

## 2020-06-05 DIAGNOSIS — K222 Esophageal obstruction: Secondary | ICD-10-CM

## 2020-06-05 DIAGNOSIS — M6259 Muscle wasting and atrophy, not elsewhere classified, multiple sites: Secondary | ICD-10-CM | POA: Diagnosis not present

## 2020-06-05 DIAGNOSIS — Z9181 History of falling: Secondary | ICD-10-CM | POA: Diagnosis not present

## 2020-06-05 DIAGNOSIS — F4323 Adjustment disorder with mixed anxiety and depressed mood: Secondary | ICD-10-CM | POA: Diagnosis not present

## 2020-06-05 DIAGNOSIS — F419 Anxiety disorder, unspecified: Secondary | ICD-10-CM | POA: Diagnosis not present

## 2020-06-05 DIAGNOSIS — E785 Hyperlipidemia, unspecified: Secondary | ICD-10-CM | POA: Diagnosis not present

## 2020-06-05 DIAGNOSIS — E119 Type 2 diabetes mellitus without complications: Secondary | ICD-10-CM | POA: Diagnosis not present

## 2020-06-05 DIAGNOSIS — S72142D Displaced intertrochanteric fracture of left femur, subsequent encounter for closed fracture with routine healing: Secondary | ICD-10-CM | POA: Diagnosis not present

## 2020-06-05 DIAGNOSIS — Z8616 Personal history of COVID-19: Secondary | ICD-10-CM | POA: Diagnosis not present

## 2020-06-05 DIAGNOSIS — I10 Essential (primary) hypertension: Secondary | ICD-10-CM | POA: Diagnosis not present

## 2020-06-05 DIAGNOSIS — G4700 Insomnia, unspecified: Secondary | ICD-10-CM | POA: Diagnosis not present

## 2020-06-05 DIAGNOSIS — Z7984 Long term (current) use of oral hypoglycemic drugs: Secondary | ICD-10-CM | POA: Diagnosis not present

## 2020-06-05 NOTE — Progress Notes (Signed)
Jonathon Bellows MD, MRCP(U.K) [address]  [place]  [place], [place] [address]  Main: [phone]  Fax: [phone]   Primary Care Physician: Cletis Athens, MD  Primary Gastroenterologist:  Dr. Jonathon Bellows   Dysphagia follow up   HPI: Derrick Jones is a 74 y.o. male   Summary of history :  Initially referred and seen in May 2021 for dysphagia ongoing for a few months affecting solid foods every time he eats.  Weight loss of 40 pounds.  History of smoking.Colonoscopy 07/2019: 4 polyps resected, tubular adenomas.  03/11/2020 EGD: Significant stenosis was seen at the GE junction concerning for neoplasm 1 cm in length.  Biopsies were taken with forceps.  Pathology showed squamous atypia in one 1 mm detached fragment.  Concerned that adequate sample may not have been obtained.  Interval history 04/10/2020-06/06/2020  04/11/2020: EGD: Few superficial ulcers with stigmata of recent bleeding were found at the GE junction biopsies were taken with forceps for histology benign stenosis found at the GE junction biopsies taken for histology Pathology demonstrated squamocolumnar mucosa with ulceration and reactive epithelial changes and foreign material.  No definite atypia or malignancy seen  Plan was to obtain a CT scan of the chest which was ordered but not obtained This is a following better after his last visit.  He has lost 9 pounds since his last visit  He was admitted to hospital and discharged on 05/07/2020 after he had a fall, found to be positive with COVID-19 and found to have a intertrochanteric fracture of the left proximal femur.  During hospitalization he had alcohol withdrawal.  He has been vaccinated against COVID-19 and is due for his booster shot next week. He says he has absolutely no issues swallowing able to eat a piece of steak. He is taking his Prilosec daily. Once a day. No other complaints. He has stopped drinking all alcohol. He has gained 3 pounds since his last  visit Current Outpatient Medications  Medication Sig Dispense Refill   atenolol (TENORMIN) 100 MG tablet Take 100 mg by mouth daily.     atorvastatin (LIPITOR) 20 MG tablet Take 20 mg by mouth daily.     melatonin 3 MG TABS tablet Take 3 mg by mouth at bedtime.     metFORMIN (GLUCOPHAGE) 500 MG tablet Take 1 tablet by mouth once daily 30 tablet 0   omeprazole (PRILOSEC) 20 MG capsule Take 20 mg by mouth daily.     omeprazole (PRILOSEC) 40 MG capsule Take 1 capsule (40 mg total) by mouth 2 (two) times daily before a meal. 60 capsule 1   enoxaparin (LOVENOX) 40 MG/0.4ML injection Inject 0.4 mLs (40 mg total) into the skin daily for 14 days. 5.6 mL 0   oxyCODONE (OXY IR/ROXICODONE) 5 MG immediate release tablet Take 0.5-1 tablets (2.5-5 mg total) by mouth every 4 (four) hours as needed for moderate pain or severe pain. (Patient not taking: Reported on 06/05/2020) 30 tablet 0   traMADol (ULTRAM) 50 MG tablet Take 1 tablet (50 mg total) by mouth every 6 (six) hours as needed for moderate pain. (Patient not taking: Reported on 06/05/2020) 30 tablet 0   No current facility-administered medications for this visit.    Allergies as of 06/05/2020   (No Known Allergies)    ROS:  General: Negative for anorexia, weight loss, fever, chills, fatigue, weakness. ENT: Negative for hoarseness, difficulty swallowing , nasal congestion. CV: Negative for chest pain, angina, palpitations, dyspnea on exertion, peripheral edema.  Respiratory: Negative for dyspnea at rest, dyspnea on exertion, cough, sputum, wheezing.  GI: See history of present illness. GU:  Negative for dysuria, hematuria, urinary incontinence, urinary frequency, nocturnal urination.  Endo: Negative for unusual weight change.    Physical Examination:   BP (!) 144/79 (BP Location: Left Arm, Patient Position: Sitting, Cuff Size: Normal)    Pulse 79    Temp 97.7 F (36.5 C) (Oral)    Ht 5\' 8"  (1.727 m)    Wt 123 lb (55.8 kg)    BMI  18.70 kg/m   General: Appears thin but healthy walking with a frame Eyes: No icterus. Conjunctivae pink. Mouth: Oropharyngeal mucosa moist and pink , no lesions erythema or exudate. Lungs: Clear to auscultation bilaterally. Non-labored. Heart: Regular rate and rhythm, no murmurs rubs or gallops.  Abdomen: Bowel sounds are normal, nontender, nondistended, no hepatosplenomegaly or masses, no abdominal bruits or hernia , no rebound or guarding.   Neuro: Alert and oriented x 3.  Grossly intact. Skin: Warm and dry, no jaundice.   Psych: Alert and cooperative, normal mood and affect.   Imaging Studies: No results found.  Assessment and Plan:   Derrick Jones is a 74 y.o. y/o male  here today to see me as a follow-up for dysphagia.  Initially seen in May 2021.  He has had a history of unintentional weight loss of over 40 pounds.  I performed an EGD in July 2021 which showed a significant stenosis at the GE junction and appearance concerning for neoplasm.  Biopsies were taken and only one small fragment showed some squamous atypia on pathology report.  Concerned that the pathology specimen may have been inadequate or may not have targeted the right area, repeat EGD in 04/05/2020 showed stenosis ulceration of the GE junction but biopsies showed only inflammation but no atypical cells.  No dilation was performed due to active inflammation.  Plan was to obtain an outpatient CT scan of the chest to rule out any extra luminal compression but it was not done.  Recently was admitted to the hospital after a fall and had a intertrochanteric fracture, diagnosed with COVID-19.  Plan 1.  Stop all alcohol use 2.  Prilosec 40 mg twice daily 3.  Await CT scan of the chest repeat EGD in 6 to 8 weeks aim to dilate the stenosis 4.  Check CBC, iron studies, B12, folate, CMP INR  I have discussed alternative options, risks & benefits,  which include, but are not limited to, bleeding, infection, perforation,respiratory  complication & drug reaction.  The patient agrees with this plan & written consent will be obtained.     Dr Jonathon Bellows  MD,MRCP [facility]) Follow up in 8-12 weeks

## 2020-06-06 ENCOUNTER — Other Ambulatory Visit: Payer: Self-pay | Admitting: Internal Medicine

## 2020-06-06 ENCOUNTER — Telehealth: Payer: Self-pay

## 2020-06-06 LAB — CBC WITH DIFFERENTIAL/PLATELET
Basophils Absolute: 0 10*3/uL (ref 0.0–0.2)
Basos: 1 %
EOS (ABSOLUTE): 0.2 10*3/uL (ref 0.0–0.4)
Eos: 3 %
Hematocrit: 35.8 % — ABNORMAL LOW (ref 37.5–51.0)
Hemoglobin: 11.8 g/dL — ABNORMAL LOW (ref 13.0–17.7)
Immature Grans (Abs): 0 10*3/uL (ref 0.0–0.1)
Immature Granulocytes: 0 %
Lymphocytes Absolute: 0.9 10*3/uL (ref 0.7–3.1)
Lymphs: 17 %
MCH: 31.1 pg (ref 26.6–33.0)
MCHC: 33 g/dL (ref 31.5–35.7)
MCV: 95 fL (ref 79–97)
Monocytes Absolute: 0.5 10*3/uL (ref 0.1–0.9)
Monocytes: 8 %
Neutrophils Absolute: 4 10*3/uL (ref 1.4–7.0)
Neutrophils: 71 %
Platelets: 274 10*3/uL (ref 150–450)
RBC: 3.79 x10E6/uL — ABNORMAL LOW (ref 4.14–5.80)
RDW: 14.2 % (ref 11.6–15.4)
WBC: 5.5 10*3/uL (ref 3.4–10.8)

## 2020-06-06 LAB — COMPREHENSIVE METABOLIC PANEL
ALT: 12 IU/L (ref 0–44)
AST: 24 IU/L (ref 0–40)
Albumin/Globulin Ratio: 1.9 (ref 1.2–2.2)
Albumin: 4.3 g/dL (ref 3.7–4.7)
Alkaline Phosphatase: 88 IU/L (ref 44–121)
BUN/Creatinine Ratio: 11 (ref 10–24)
BUN: 8 mg/dL (ref 8–27)
Bilirubin Total: 0.4 mg/dL (ref 0.0–1.2)
CO2: 25 mmol/L (ref 20–29)
Calcium: 9.3 mg/dL (ref 8.6–10.2)
Chloride: 97 mmol/L (ref 96–106)
Creatinine, Ser: 0.75 mg/dL — ABNORMAL LOW (ref 0.76–1.27)
GFR calc Af Amer: 104 mL/min/{1.73_m2} (ref >59)
GFR calc non Af Amer: 90 mL/min/{1.73_m2} (ref >59)
Globulin, Total: 2.3 g/dL (ref 1.5–4.5)
Glucose: 160 mg/dL — ABNORMAL HIGH (ref 65–99)
Potassium: 4.1 mmol/L (ref 3.5–5.2)
Sodium: 136 mmol/L (ref 134–144)
Total Protein: 6.6 g/dL (ref 6.0–8.5)

## 2020-06-06 LAB — HEPATITIS B SURFACE ANTIGEN: Hepatitis B Surface Ag: NEGATIVE

## 2020-06-06 LAB — IRON,TIBC AND FERRITIN PANEL
Ferritin: 77 ng/mL (ref 30–400)
Iron Saturation: 11 % — ABNORMAL LOW (ref 15–55)
Iron: 41 ug/dL (ref 38–169)
Total Iron Binding Capacity: 367 ug/dL (ref 250–450)
UIBC: 326 ug/dL (ref 111–343)

## 2020-06-06 LAB — PROTIME-INR
INR: 1 (ref 0.9–1.2)
Prothrombin Time: 10.1 s (ref 9.1–12.0)

## 2020-06-06 LAB — HEPATITIS C ANTIBODY: Hep C Virus Ab: <0.1 s/co ratio (ref 0.0–0.9)

## 2020-06-06 LAB — HEPATITIS B SURFACE ANTIBODY,QUALITATIVE: Hep B Surface Ab, Qual: NONREACTIVE

## 2020-06-06 LAB — B12 AND FOLATE PANEL
Folate: 6.8 ng/mL (ref >3.0)
Vitamin B-12: 694 pg/mL (ref 232–1245)

## 2020-06-06 NOTE — Telephone Encounter (Signed)
Called patient to inform him his CT scan has been scheduled for 11/05. LVM to call office back with any questions.

## 2020-06-17 ENCOUNTER — Encounter: Payer: Self-pay | Admitting: Gastroenterology

## 2020-06-19 DIAGNOSIS — S72142D Displaced intertrochanteric fracture of left femur, subsequent encounter for closed fracture with routine healing: Secondary | ICD-10-CM | POA: Diagnosis not present

## 2020-06-19 DIAGNOSIS — F4323 Adjustment disorder with mixed anxiety and depressed mood: Secondary | ICD-10-CM | POA: Diagnosis not present

## 2020-06-19 DIAGNOSIS — I10 Essential (primary) hypertension: Secondary | ICD-10-CM | POA: Diagnosis not present

## 2020-06-19 DIAGNOSIS — K5900 Constipation, unspecified: Secondary | ICD-10-CM | POA: Diagnosis not present

## 2020-06-19 DIAGNOSIS — E119 Type 2 diabetes mellitus without complications: Secondary | ICD-10-CM | POA: Diagnosis not present

## 2020-06-19 DIAGNOSIS — F419 Anxiety disorder, unspecified: Secondary | ICD-10-CM | POA: Diagnosis not present

## 2020-06-19 DIAGNOSIS — G4700 Insomnia, unspecified: Secondary | ICD-10-CM | POA: Diagnosis not present

## 2020-06-19 DIAGNOSIS — M6259 Muscle wasting and atrophy, not elsewhere classified, multiple sites: Secondary | ICD-10-CM | POA: Diagnosis not present

## 2020-06-19 DIAGNOSIS — Z7984 Long term (current) use of oral hypoglycemic drugs: Secondary | ICD-10-CM | POA: Diagnosis not present

## 2020-06-19 DIAGNOSIS — Z9181 History of falling: Secondary | ICD-10-CM | POA: Diagnosis not present

## 2020-06-19 DIAGNOSIS — Z8616 Personal history of COVID-19: Secondary | ICD-10-CM | POA: Diagnosis not present

## 2020-06-19 DIAGNOSIS — E785 Hyperlipidemia, unspecified: Secondary | ICD-10-CM | POA: Diagnosis not present

## 2020-06-21 ENCOUNTER — Ambulatory Visit: Admission: RE | Admit: 2020-06-21 | Payer: PPO | Source: Ambulatory Visit

## 2020-06-21 ENCOUNTER — Telehealth: Payer: Self-pay | Admitting: Gastroenterology

## 2020-06-21 NOTE — Telephone Encounter (Signed)
Patient states the place where his CT scan was scheduled at today 11.5.21 was going to charge him $100+ up front, which he states he does not have. Pt wants to know if there is anywhere else he can have his CT done where his ins covers and he doesn't have to pay that much. Please advise and let pt know.

## 2020-06-24 DIAGNOSIS — Z9889 Other specified postprocedural states: Secondary | ICD-10-CM | POA: Diagnosis not present

## 2020-06-24 DIAGNOSIS — Z8781 Personal history of (healed) traumatic fracture: Secondary | ICD-10-CM | POA: Diagnosis not present

## 2020-06-24 NOTE — Telephone Encounter (Signed)
Any patient assistance available?

## 2020-06-24 NOTE — Telephone Encounter (Signed)
Unfortunately, that was probably his co-pay and I can't do anything about that. They don't offer patient assistance for that.

## 2020-07-03 ENCOUNTER — Other Ambulatory Visit: Payer: Self-pay

## 2020-07-03 DIAGNOSIS — K222 Esophageal obstruction: Secondary | ICD-10-CM

## 2020-07-25 ENCOUNTER — Other Ambulatory Visit: Payer: Self-pay

## 2020-07-25 ENCOUNTER — Other Ambulatory Visit
Admission: RE | Admit: 2020-07-25 | Discharge: 2020-07-25 | Disposition: A | Discharge status: Home / Self Care | Payer: PPO | Source: Ambulatory Visit | Attending: Gastroenterology | Admitting: Gastroenterology

## 2020-07-25 DIAGNOSIS — Z01812 Encounter for preprocedural laboratory examination: Secondary | ICD-10-CM | POA: Insufficient documentation

## 2020-07-25 DIAGNOSIS — Z20822 Contact with and (suspected) exposure to covid-19: Secondary | ICD-10-CM | POA: Insufficient documentation

## 2020-07-26 LAB — SARS CORONAVIRUS 2 (TAT 6-24 HRS): SARS Coronavirus 2: NEGATIVE

## 2020-07-29 ENCOUNTER — Ambulatory Visit: Payer: PPO | Admitting: Certified Registered"

## 2020-07-29 ENCOUNTER — Ambulatory Visit
Admission: RE | Admit: 2020-07-29 | Discharge: 2020-07-29 | Disposition: A | Discharge status: Home / Self Care | Payer: PPO | Attending: Gastroenterology | Admitting: Gastroenterology

## 2020-07-29 ENCOUNTER — Encounter
Admission: RE | Disposition: A | Discharge status: Home / Self Care | Payer: Self-pay | Source: Home / Self Care | Attending: Gastroenterology

## 2020-07-29 ENCOUNTER — Encounter: Payer: Self-pay | Admitting: Gastroenterology

## 2020-07-29 DIAGNOSIS — E7800 Pure hypercholesterolemia, unspecified: Secondary | ICD-10-CM | POA: Diagnosis not present

## 2020-07-29 DIAGNOSIS — Z79899 Other long term (current) drug therapy: Secondary | ICD-10-CM | POA: Diagnosis not present

## 2020-07-29 DIAGNOSIS — F17210 Nicotine dependence, cigarettes, uncomplicated: Secondary | ICD-10-CM | POA: Diagnosis not present

## 2020-07-29 DIAGNOSIS — Z7984 Long term (current) use of oral hypoglycemic drugs: Secondary | ICD-10-CM | POA: Insufficient documentation

## 2020-07-29 DIAGNOSIS — R1310 Dysphagia, unspecified: Secondary | ICD-10-CM | POA: Insufficient documentation

## 2020-07-29 DIAGNOSIS — K219 Gastro-esophageal reflux disease without esophagitis: Secondary | ICD-10-CM | POA: Diagnosis not present

## 2020-07-29 DIAGNOSIS — E119 Type 2 diabetes mellitus without complications: Secondary | ICD-10-CM | POA: Diagnosis not present

## 2020-07-29 DIAGNOSIS — I10 Essential (primary) hypertension: Secondary | ICD-10-CM | POA: Diagnosis not present

## 2020-07-29 DIAGNOSIS — K222 Esophageal obstruction: Secondary | ICD-10-CM | POA: Diagnosis not present

## 2020-07-29 DIAGNOSIS — Z7901 Long term (current) use of anticoagulants: Secondary | ICD-10-CM | POA: Diagnosis not present

## 2020-07-29 HISTORY — PX: ESOPHAGOGASTRODUODENOSCOPY (EGD) WITH PROPOFOL: SHX5813

## 2020-07-29 LAB — GLUCOSE, CAPILLARY: Glucose-Capillary: 131 mg/dL — ABNORMAL HIGH (ref 70–99)

## 2020-07-29 SURGERY — ESOPHAGOGASTRODUODENOSCOPY (EGD) WITH PROPOFOL
Anesthesia: General

## 2020-07-29 MED ORDER — GLYCOPYRROLATE 0.2 MG/ML IJ SOLN
INTRAMUSCULAR | Status: DC | PRN
  Administered 2020-07-29: .2 mg via INTRAVENOUS

## 2020-07-29 MED ORDER — PROPOFOL 10 MG/ML IV BOLUS
INTRAVENOUS | Status: DC | PRN
  Administered 2020-07-29 (×2): 50 mg via INTRAVENOUS

## 2020-07-29 MED ORDER — LIDOCAINE HCL (PF) 2 % IJ SOLN
INTRAMUSCULAR | Status: AC
  Filled 2020-07-29: qty 5

## 2020-07-29 MED ORDER — METHOHEXITAL SODIUM 0.5 G IJ SOLR
INTRAMUSCULAR | Status: AC
  Filled 2020-07-29: qty 500

## 2020-07-29 MED ORDER — LIDOCAINE HCL (CARDIAC) PF 100 MG/5ML IV SOSY
PREFILLED_SYRINGE | INTRAVENOUS | Status: DC | PRN
  Administered 2020-07-29: 25 mg via INTRAVENOUS

## 2020-07-29 MED ORDER — GLYCOPYRROLATE 0.2 MG/ML IJ SOLN
INTRAMUSCULAR | Status: AC
  Filled 2020-07-29: qty 1

## 2020-07-29 MED ORDER — SODIUM CHLORIDE 0.9 % IV SOLN
INTRAVENOUS | Status: DC
  Administered 2020-07-29: 08:00:00 1000 mL via INTRAVENOUS

## 2020-07-29 MED ORDER — PROPOFOL 500 MG/50ML IV EMUL
INTRAVENOUS | Status: AC
  Filled 2020-07-29: qty 50

## 2020-07-29 NOTE — Anesthesia Postprocedure Evaluation (Signed)
Anesthesia Post Note  Patient: Derrick Jones  Procedure(s) Performed: ESOPHAGOGASTRODUODENOSCOPY (EGD) WITH PROPOFOL (N/A )  Patient location during evaluation: Endoscopy Anesthesia Type: General Level of consciousness: awake and alert Pain management: pain level controlled Vital Signs Assessment: post-procedure vital signs reviewed and stable Respiratory status: spontaneous breathing, nonlabored ventilation, respiratory function stable and patient connected to nasal cannula oxygen Cardiovascular status: blood pressure returned to baseline and stable Postop Assessment: no apparent nausea or vomiting Anesthetic complications: no   No complications documented.   Last Vitals:  Vitals:   07/29/20 0722 07/29/20 0842  BP: (!) 122/94 97/67  Pulse: 77 72  Resp: 16 (!) 26  Temp: (!) 36.1 C 36.7 C  SpO2: 100% 98%    Last Pain:  Vitals:   07/29/20 0912  TempSrc:   PainSc: 0-No pain                 Arita Miss

## 2020-07-29 NOTE — Op Note (Signed)
[facility] Gastroenterology Patient Name: Derrick Jones Procedure Date: 07/29/2020 8:30 AM MRN: [id number] Account #: [id number] Date of Birth: 04/11/1946 Admit Type: Outpatient Age: 74 Room: [facility] ENDO ROOM 4 Gender: Male Note Status: Finalized Procedure:             Upper GI endoscopy Indications:           Dysphagia Providers:             Jonathon Bellows MD, MD Referring MD:          Cletis Athens, MD (Referring MD) Medicines:             Monitored Anesthesia Care Complications:         No immediate complications. Procedure:             Pre-Anesthesia Assessment:                        - Prior to the procedure, a History and Physical was                         performed, and patient medications, allergies and                         sensitivities were reviewed. The patient's tolerance                         of previous anesthesia was reviewed.                        - The risks and benefits of the procedure and the                         sedation options and risks were discussed with the                         patient. All questions were answered and informed                         consent was obtained.                        - ASA Grade Assessment: II - A patient with mild                         systemic disease.                        After obtaining informed consent, the endoscope was                         passed under direct vision. Throughout the procedure,                         the patient's blood pressure, pulse, and oxygen                         saturations were monitored continuously. The Endoscope                         was introduced through the mouth, and advanced to  the                         third part of duodenum. The upper GI endoscopy was                         accomplished with ease. The patient tolerated the                         procedure well. Findings:      One benign-appearing, intrinsic severe (stenosis; an endoscope cannot        pass) stenosis was found at the gastroesophageal junction. This stenosis       measured 9 mm (inner diameter) x 1 cm (in length). The stenosis was       traversed after dilation. A TTS dilator was passed through the scope.       Dilation with an 03-25-09 mm balloon dilator was performed to 10 mm. The       dilation site was examined and showed moderate mucosal disruption.      The exam was otherwise without abnormality.      The stomach was normal.      The examined duodenum was normal.      The cardia and gastric fundus were normal on retroflexion. Impression:            - Benign-appearing esophageal stenosis. Dilated.                        - The examination was otherwise normal.                        - Normal stomach.                        - Normal examined duodenum.                        - No specimens collected. Recommendation:        - Discharge patient to home (with escort).                        - Resume previous diet.                        - Continue present medications.                        - Repeat upper endoscopy in 2 weeks for retreatment. Procedure Code(s):     --- Professional ---                        [phone], Esophagogastroduodenoscopy, flexible,                         transoral; with transendoscopic balloon dilation of                         esophagus (less than 30 mm diameter) Diagnosis Code(s):     --- Professional ---                        K22.2, Esophageal obstruction  R13.10, Dysphagia, unspecified CPT copyright 2019 American Medical Association. All rights reserved. The codes documented in this report are preliminary and upon coder review may  be revised to meet current compliance requirements. Jonathon Bellows, MD Jonathon Bellows MD, MD 07/29/2020 8:40:21 AM This report has been signed electronically. Number of Addenda: 0 Note Initiated On: 07/29/2020 8:30 AM      [facility]

## 2020-07-29 NOTE — Anesthesia Preprocedure Evaluation (Signed)
Anesthesia Evaluation  Patient identified by MRN, date of birth, ID band Patient awake    Reviewed: Allergy & Precautions, H&P , NPO status , Patient's Chart, lab work & pertinent test results  History of Anesthesia Complications Negative for: history of anesthetic complications  Airway Mallampati: III  TM Distance: >3 FB Neck ROM: Full    Dental  (+) Edentulous Upper, Partial Lower, Poor Dentition   Pulmonary neg COPD, Current Smoker and Patient abstained from smoking.,  Inhalers daily, never hospitalized for any kind of COPD    breath sounds clear to auscultation       Cardiovascular hypertension, (-) angina(-) Past MI  Rhythm:regular Rate:Normal     Neuro/Psych PSYCHIATRIC DISORDERS Anxiety negative neurological ROS     GI/Hepatic GERD  ,(+)     substance abuse  alcohol use,   Endo/Other  diabetes  Renal/GU negative Renal ROS  negative genitourinary   Musculoskeletal   Abdominal   Peds negative pediatric ROS (+)  Hematology negative hematology ROS (+)   Anesthesia Other Findings Past Medical History: No date: Diabetes mellitus without complication (HCC) No date: GERD (gastroesophageal reflux disease) No date: Hypertension  Past Surgical History: 07/21/2019: COLONOSCOPY WITH PROPOFOL; N/A     Comment:  Procedure: COLONOSCOPY WITH PROPOFOL;  Surgeon: Jonathon Bellows, MD;  Location: [facility] ENDOSCOPY;  Service:               Gastroenterology;  Laterality: N/A; No date: EYE SURGERY No date: FEMUR FRACTURE SURGERY; Left  BMI    Body Mass Index: 16.98 kg/m      Reproductive/Obstetrics negative OB ROS                             Anesthesia Physical  Anesthesia Plan  ASA: III  Anesthesia Plan: General   Post-op Pain Management:    Induction: Intravenous  PONV Risk Score and Plan: 1 and Propofol infusion and TIVA  Airway Management Planned: Nasal  Cannula  Additional Equipment: None  Intra-op Plan:   Post-operative Plan:   Informed Consent: I have reviewed the patients History and Physical, chart, labs and discussed the procedure including the risks, benefits and alternatives for the proposed anesthesia with the patient or authorized representative who has indicated his/her understanding and acceptance.     Dental Advisory Given  Plan Discussed with: Anesthesiologist, CRNA and Surgeon  Anesthesia Plan Comments: (Discussed risks of anesthesia with patient, including possibility of difficulty with spontaneous ventilation under anesthesia necessitating airway intervention, PONV, and rare risks such as cardiac or respiratory or neurological events. Patient understands.)        Anesthesia Quick Evaluation

## 2020-07-29 NOTE — Transfer of Care (Signed)
Immediate Anesthesia Transfer of Care Note  Patient: Derrick Jones  Procedure(s) Performed: ESOPHAGOGASTRODUODENOSCOPY (EGD) WITH PROPOFOL (N/A )  Patient Location: PACU and Endoscopy Unit  Anesthesia Type:General  Level of Consciousness: drowsy  Airway & Oxygen Therapy: Patient Spontanous Breathing  Post-op Assessment: Report given to RN  Post vital signs: stable  Last Vitals:  Vitals Value Taken Time  BP    Temp    Pulse    Resp    SpO2      Last Pain:  Vitals:   07/29/20 0722  TempSrc: Temporal  PainSc: 0-No pain         Complications: No complications documented.

## 2020-07-29 NOTE — H&P (Signed)
Jonathon Bellows, MD [address], [place], [place], [place], [address] [address], [place], [place], [place], [address] Phone: [phone]  Fax: [phone]  Primary Care Physician:  Cletis Athens, MD   Pre-Procedure History & Physical: HPI:  Derrick Jones is a 74 y.o. male is here for an endoscopy    Past Medical History:  Diagnosis Date  . Diabetes mellitus without complication ([place])   . GERD (gastroesophageal reflux disease)   . Hypertension     Past Surgical History:  Procedure Laterality Date  . COLONOSCOPY WITH PROPOFOL N/A 07/21/2019   Procedure: COLONOSCOPY WITH PROPOFOL;  Surgeon: Jonathon Bellows, MD;  Location: [facility] ENDOSCOPY;  Service: Gastroenterology;  Laterality: N/A;  . ESOPHAGOGASTRODUODENOSCOPY (EGD) WITH PROPOFOL N/A 03/11/2020   Procedure: ESOPHAGOGASTRODUODENOSCOPY (EGD) WITH PROPOFOL;  Surgeon: Jonathon Bellows, MD;  Location: [facility] ENDOSCOPY;  Service: Gastroenterology;  Laterality: N/A;  . ESOPHAGOGASTRODUODENOSCOPY (EGD) WITH PROPOFOL N/A 04/11/2020   Procedure: ESOPHAGOGASTRODUODENOSCOPY (EGD) WITH PROPOFOL;  Surgeon: Jonathon Bellows, MD;  Location: [facility] ENDOSCOPY;  Service: Gastroenterology;  Laterality: N/A;  . EYE SURGERY    . FEMUR FRACTURE SURGERY Left   . FRACTURE SURGERY    . INTRAMEDULLARY (IM) NAIL INTERTROCHANTERIC Left 05/03/2020   Procedure: INTRAMEDULLARY (IM) NAIL INTERTROCHANTRIC;  Surgeon: Leim Fabry, MD;  Location: [facility] ORS;  Service: Orthopedics;  Laterality: Left;    Prior to Admission medications   Medication Sig Start Date End Date Taking? Authorizing Provider  atenolol (TENORMIN) 100 MG tablet Take 100 mg by mouth daily. 02/06/20  Yes [provider]  atorvastatin (LIPITOR) 20 MG tablet Take 20 mg by mouth daily. 11/14/19  Yes [provider]  metFORMIN (GLUCOPHAGE) 500 MG tablet Take 1 tablet by mouth once daily 06/06/20  Yes Beckie Salts, FNP  omeprazole (PRILOSEC) 20 MG capsule Take 20 mg by mouth daily.   Yes  [provider]  enoxaparin (LOVENOX) 40 MG/0.4ML injection Inject 0.4 mLs (40 mg total) into the skin daily for 14 days. 05/06/20 05/20/20  Reche Dixon, PA-C  melatonin 3 MG TABS tablet Take 3 mg by mouth at bedtime. Patient not taking: Reported on 07/29/2020    [provider]  omeprazole (PRILOSEC) 40 MG capsule Take 1 capsule (40 mg total) by mouth 2 (two) times daily before a meal. 05/06/20   Darliss Cheney, MD  oxyCODONE (OXY IR/ROXICODONE) 5 MG immediate release tablet Take 0.5-1 tablets (2.5-5 mg total) by mouth every 4 (four) hours as needed for moderate pain or severe pain. Patient not taking: Reported on 06/05/2020 05/07/20   Darliss Cheney, MD  traMADol (ULTRAM) 50 MG tablet Take 1 tablet (50 mg total) by mouth every 6 (six) hours as needed for moderate pain. Patient not taking: Reported on 06/05/2020 05/05/20   Reche Dixon, PA-C    Allergies as of 07/03/2020  . (No Known Allergies)    Family History  Problem Relation Age of Onset  . Breast cancer Sister   . Ovarian cancer Sister     Social History   Socioeconomic History  . Marital status: Married    Spouse name: Not on file  . Number of children: Not on file  . Years of education: Not on file  . Highest education level: Not on file  Occupational History  . Not on file  Tobacco Use  . Smoking status: Current Every Day Smoker    Packs/day: 1.00    Years: 53.00    Pack years: 53.00    Types: Cigarettes  .  Smokeless tobacco: Never Used  Vaping Use  . Vaping Use: Never used  Substance and Sexual Activity  . Alcohol use: Yes  . Drug use: No  . Sexual activity: Not on file  Other Topics Concern  . Not on file  Social History Narrative  . Not on file   Social Determinants of Health   Financial Resource Strain: Not on file  Food Insecurity: Not on file  Transportation Needs: Not on file  Physical Activity: Not on file  Stress: Not on file  Social Connections: Not on file  Intimate Partner  Violence: Not on file    Review of Systems: See HPI, otherwise negative ROS  Physical Exam: BP (!) 122/94   Pulse 77   Temp (!) 96.9 F (36.1 C) (Temporal)   Resp 16   Ht 5\' 9"  (1.753 m)   Wt 60.5 kg   SpO2 100%   BMI 19.68 kg/m  General:   Alert,  pleasant and cooperative in NAD Head:  Normocephalic and atraumatic. Neck:  Supple; no masses or thyromegaly. Lungs:  Clear throughout to auscultation, normal respiratory effort.    Heart:  +S1, +S2, Regular rate and rhythm, No edema. Abdomen:  Soft, nontender and nondistended. Normal bowel sounds, without guarding, and without rebound.   Neurologic:  Alert and  oriented x4;  grossly normal neurologically.  Impression/Plan: Derrick Jones is here for an endoscopy  to be performed for  evaluation of abnormal GE junction.     Risks, benefits, limitations, and alternatives regarding endoscopy have been reviewed with the patient.  Questions have been answered.  All parties agreeable.   Jonathon Bellows, MD  07/29/2020, 8:29 AM

## 2020-07-30 ENCOUNTER — Telehealth: Payer: Self-pay | Admitting: Gastroenterology

## 2020-07-30 ENCOUNTER — Encounter: Payer: Self-pay | Admitting: Gastroenterology

## 2020-07-30 NOTE — Telephone Encounter (Signed)
-----   Message from Rushie Chestnut, [place] sent at 07/30/2020  4:47 PM EST ----- Regarding: Cancel pt appt Per Dr. Vicente Males, cancel pt's 07-31-20 visit, pt will reschedule after his next procedure...the patient is aware

## 2020-07-30 NOTE — Telephone Encounter (Signed)
Pt appt was canceled for tomorrow 12.15.21. FYI

## 2020-07-31 ENCOUNTER — Ambulatory Visit: Payer: PPO | Admitting: Gastroenterology

## 2020-09-10 ENCOUNTER — Other Ambulatory Visit: Payer: Self-pay | Admitting: Family Medicine

## 2020-11-25 ENCOUNTER — Other Ambulatory Visit: Payer: Self-pay | Admitting: Internal Medicine

## 2020-12-02 ENCOUNTER — Other Ambulatory Visit: Payer: Self-pay | Admitting: *Deleted

## 2020-12-02 MED ORDER — METFORMIN HCL 500 MG PO TABS: 1.0000 | ORAL_TABLET | Freq: Every day | ORAL | 3 refills | Status: DC

## 2020-12-31 DIAGNOSIS — M5030 Other cervical disc degeneration, unspecified cervical region: Secondary | ICD-10-CM | POA: Diagnosis not present

## 2020-12-31 DIAGNOSIS — S161XXA Strain of muscle, fascia and tendon at neck level, initial encounter: Secondary | ICD-10-CM | POA: Diagnosis not present

## 2020-12-31 DIAGNOSIS — M542 Cervicalgia: Secondary | ICD-10-CM | POA: Diagnosis not present

## 2021-02-03 ENCOUNTER — Encounter: Payer: Self-pay | Admitting: Emergency Medicine

## 2021-02-03 ENCOUNTER — Other Ambulatory Visit: Payer: Self-pay

## 2021-02-03 ENCOUNTER — Inpatient Hospital Stay
Admission: EM | Admit: 2021-02-03 | Discharge: 2021-02-05 | DRG: 897 | Disposition: A | Discharge status: Home / Self Care | Payer: PPO | Attending: Internal Medicine | Admitting: Internal Medicine

## 2021-02-03 DIAGNOSIS — E785 Hyperlipidemia, unspecified: Secondary | ICD-10-CM | POA: Diagnosis present

## 2021-02-03 DIAGNOSIS — F10230 Alcohol dependence with withdrawal, uncomplicated: Secondary | ICD-10-CM | POA: Diagnosis not present

## 2021-02-03 DIAGNOSIS — F10231 Alcohol dependence with withdrawal delirium: Principal | ICD-10-CM | POA: Diagnosis present

## 2021-02-03 DIAGNOSIS — R339 Retention of urine, unspecified: Secondary | ICD-10-CM | POA: Diagnosis not present

## 2021-02-03 DIAGNOSIS — Z7984 Long term (current) use of oral hypoglycemic drugs: Secondary | ICD-10-CM | POA: Diagnosis not present

## 2021-02-03 DIAGNOSIS — E871 Hypo-osmolality and hyponatremia: Secondary | ICD-10-CM

## 2021-02-03 DIAGNOSIS — F1010 Alcohol abuse, uncomplicated: Secondary | ICD-10-CM | POA: Diagnosis present

## 2021-02-03 DIAGNOSIS — Z7902 Long term (current) use of antithrombotics/antiplatelets: Secondary | ICD-10-CM

## 2021-02-03 DIAGNOSIS — R338 Other retention of urine: Secondary | ICD-10-CM

## 2021-02-03 DIAGNOSIS — Y900 Blood alcohol level of less than 20 mg/100 ml: Secondary | ICD-10-CM | POA: Diagnosis present

## 2021-02-03 DIAGNOSIS — R509 Fever, unspecified: Secondary | ICD-10-CM | POA: Diagnosis not present

## 2021-02-03 DIAGNOSIS — F10239 Alcohol dependence with withdrawal, unspecified: Secondary | ICD-10-CM | POA: Diagnosis not present

## 2021-02-03 DIAGNOSIS — Z20822 Contact with and (suspected) exposure to covid-19: Secondary | ICD-10-CM | POA: Diagnosis present

## 2021-02-03 DIAGNOSIS — Z79899 Other long term (current) drug therapy: Secondary | ICD-10-CM

## 2021-02-03 DIAGNOSIS — K219 Gastro-esophageal reflux disease without esophagitis: Secondary | ICD-10-CM | POA: Diagnosis present

## 2021-02-03 DIAGNOSIS — E1169 Type 2 diabetes mellitus with other specified complication: Secondary | ICD-10-CM | POA: Diagnosis not present

## 2021-02-03 DIAGNOSIS — R45851 Suicidal ideations: Secondary | ICD-10-CM

## 2021-02-03 DIAGNOSIS — F17210 Nicotine dependence, cigarettes, uncomplicated: Secondary | ICD-10-CM | POA: Diagnosis present

## 2021-02-03 DIAGNOSIS — I10 Essential (primary) hypertension: Secondary | ICD-10-CM

## 2021-02-03 DIAGNOSIS — F10939 Alcohol use, unspecified with withdrawal, unspecified: Secondary | ICD-10-CM | POA: Diagnosis present

## 2021-02-03 HISTORY — DX: Hypo-osmolality and hyponatremia: E87.1

## 2021-02-03 LAB — CBC WITH DIFFERENTIAL/PLATELET
Abs Immature Granulocytes: 0.03 10*3/uL (ref 0.00–0.07)
Basophils Absolute: 0 10*3/uL (ref 0.0–0.1)
Basophils Relative: 0 %
Eosinophils Absolute: 0 10*3/uL (ref 0.0–0.5)
Eosinophils Relative: 0 %
HCT: 37.9 % — ABNORMAL LOW (ref 39.0–52.0)
Hemoglobin: 13.7 g/dL (ref 13.0–17.0)
Immature Granulocytes: 0 %
Lymphocytes Relative: 16 %
Lymphs Abs: 1.5 10*3/uL (ref 0.7–4.0)
MCH: 31.8 pg (ref 26.0–34.0)
MCHC: 36.1 g/dL — ABNORMAL HIGH (ref 30.0–36.0)
MCV: 87.9 fL (ref 80.0–100.0)
Monocytes Absolute: 0.4 10*3/uL (ref 0.1–1.0)
Monocytes Relative: 5 %
Neutro Abs: 7.2 10*3/uL (ref 1.7–7.7)
Neutrophils Relative %: 79 %
Platelets: 242 10*3/uL (ref 150–400)
RBC: 4.31 MIL/uL (ref 4.22–5.81)
RDW: 14.4 % (ref 11.5–15.5)
WBC: 9.2 10*3/uL (ref 4.0–10.5)
nRBC: 0 % (ref 0.0–0.2)

## 2021-02-03 LAB — RESP PANEL BY RT-PCR (FLU A&B, COVID) ARPGX2
Influenza A by PCR: NEGATIVE
Influenza B by PCR: NEGATIVE
SARS Coronavirus 2 by RT PCR: NEGATIVE

## 2021-02-03 LAB — MAGNESIUM: Magnesium: 1.9 mg/dL (ref 1.7–2.4)

## 2021-02-03 LAB — COMPREHENSIVE METABOLIC PANEL
ALT: 33 U/L (ref 0–44)
AST: 63 U/L — ABNORMAL HIGH (ref 15–41)
Albumin: 4.5 g/dL (ref 3.5–5.0)
Alkaline Phosphatase: 60 U/L (ref 38–126)
Anion gap: 11 (ref 5–15)
BUN: 14 mg/dL (ref 8–23)
CO2: 27 mmol/L (ref 22–32)
Calcium: 9.4 mg/dL (ref 8.9–10.3)
Chloride: 88 mmol/L — ABNORMAL LOW (ref 98–111)
Creatinine, Ser: 0.85 mg/dL (ref 0.61–1.24)
GFR, Estimated: >60 mL/min (ref >60)
Glucose, Bld: 109 mg/dL — ABNORMAL HIGH (ref 70–99)
Potassium: 4.6 mmol/L (ref 3.5–5.1)
Sodium: 126 mmol/L — ABNORMAL LOW (ref 135–145)
Total Bilirubin: 2.5 mg/dL — ABNORMAL HIGH (ref 0.3–1.2)
Total Protein: 7.3 g/dL (ref 6.5–8.1)

## 2021-02-03 LAB — SALICYLATE LEVEL: Salicylate Lvl: <7 mg/dL — ABNORMAL LOW (ref 7.0–30.0)

## 2021-02-03 LAB — ETHANOL: Alcohol, Ethyl (B): <10 mg/dL (ref <10)

## 2021-02-03 LAB — URINALYSIS, COMPLETE (UACMP) WITH MICROSCOPIC
Bacteria, UA: NONE SEEN
Bilirubin Urine: NEGATIVE
Glucose, UA: 50 mg/dL — AB
Hgb urine dipstick: NEGATIVE
Ketones, ur: NEGATIVE mg/dL
Leukocytes,Ua: NEGATIVE
Nitrite: NEGATIVE
Protein, ur: 30 mg/dL — AB
Specific Gravity, Urine: 1.01 (ref 1.005–1.030)
Squamous Epithelial / HPF: NONE SEEN (ref 0–5)
pH: 5 (ref 5.0–8.0)

## 2021-02-03 LAB — CBG MONITORING, ED: Glucose-Capillary: 103 mg/dL — ABNORMAL HIGH (ref 70–99)

## 2021-02-03 LAB — PHOSPHORUS: Phosphorus: 2.6 mg/dL (ref 2.5–4.6)

## 2021-02-03 LAB — AMMONIA: Ammonia: 13 umol/L (ref 9–35)

## 2021-02-03 LAB — ACETAMINOPHEN LEVEL: Acetaminophen (Tylenol), Serum: <10 ug/mL — ABNORMAL LOW (ref 10–30)

## 2021-02-03 MED ORDER — LORAZEPAM 1 MG PO TABS
1.0000 mg | ORAL_TABLET | ORAL | Status: DC | PRN
  Administered 2021-02-03 – 2021-02-04 (×5): 1 mg via ORAL
  Filled 2021-02-03 (×6): qty 1

## 2021-02-03 MED ORDER — LORAZEPAM 2 MG/ML IJ SOLN
1.0000 mg | INTRAMUSCULAR | Status: DC | PRN
  Administered 2021-02-04 – 2021-02-05 (×4): 2 mg via INTRAVENOUS
  Filled 2021-02-03 (×5): qty 1

## 2021-02-03 MED ORDER — THIAMINE HCL 100 MG/ML IJ SOLN: 100.0000 mg | Freq: Every day | INTRAMUSCULAR | Status: DC

## 2021-02-03 MED ORDER — SODIUM CHLORIDE 0.9 % IV BOLUS
1000.0000 mL | Freq: Once | INTRAVENOUS | Status: AC
  Administered 2021-02-03: 1000 mL via INTRAVENOUS

## 2021-02-03 MED ORDER — PANTOPRAZOLE SODIUM 40 MG PO TBEC
80.0000 mg | DELAYED_RELEASE_TABLET | Freq: Every day | ORAL | Status: DC
  Administered 2021-02-03 – 2021-02-05 (×3): 80 mg via ORAL
  Filled 2021-02-03 (×3): qty 2

## 2021-02-03 MED ORDER — NICOTINE 14 MG/24HR TD PT24
14.0000 mg | MEDICATED_PATCH | Freq: Every day | TRANSDERMAL | Status: DC
  Administered 2021-02-03 – 2021-02-05 (×3): 14 mg via TRANSDERMAL
  Filled 2021-02-03 (×3): qty 1

## 2021-02-03 MED ORDER — ACETAMINOPHEN 650 MG RE SUPP: 650.0000 mg | Freq: Four times a day (QID) | RECTAL | Status: DC | PRN

## 2021-02-03 MED ORDER — MELATONIN 5 MG PO TABS
2.5000 mg | ORAL_TABLET | Freq: Every day | ORAL | Status: DC
  Administered 2021-02-03: 2.5 mg via ORAL
  Filled 2021-02-03: qty 1
  Filled 2021-02-03 (×2): qty 0.5

## 2021-02-03 MED ORDER — ACETAMINOPHEN 325 MG PO TABS: 650.0000 mg | ORAL_TABLET | Freq: Four times a day (QID) | ORAL | Status: DC | PRN

## 2021-02-03 MED ORDER — FOLIC ACID 1 MG PO TABS
1.0000 mg | ORAL_TABLET | Freq: Every day | ORAL | Status: DC
  Administered 2021-02-03 – 2021-02-05 (×3): 1 mg via ORAL
  Filled 2021-02-03 (×3): qty 1

## 2021-02-03 MED ORDER — ONDANSETRON HCL 4 MG PO TABS: 4.0000 mg | ORAL_TABLET | Freq: Four times a day (QID) | ORAL | Status: DC | PRN

## 2021-02-03 MED ORDER — INSULIN ASPART 100 UNIT/ML IJ SOLN
0.0000 [IU] | Freq: Three times a day (TID) | INTRAMUSCULAR | Status: DC
Start: 2021-02-04 — End: 2021-02-05

## 2021-02-03 MED ORDER — ONDANSETRON HCL 4 MG/2ML IJ SOLN: 4.0000 mg | Freq: Four times a day (QID) | INTRAMUSCULAR | Status: DC | PRN

## 2021-02-03 MED ORDER — THIAMINE HCL 100 MG/ML IJ SOLN
100.0000 mg | Freq: Once | INTRAMUSCULAR | Status: AC
  Administered 2021-02-03: 16:00:00 100 mg via INTRAVENOUS
  Filled 2021-02-03: qty 2

## 2021-02-03 MED ORDER — DIAZEPAM 5 MG/ML IJ SOLN
5.0000 mg | Freq: Once | INTRAMUSCULAR | Status: AC
  Administered 2021-02-03: 16:00:00 5 mg via INTRAVENOUS
  Filled 2021-02-03: qty 2

## 2021-02-03 MED ORDER — ENOXAPARIN SODIUM 40 MG/0.4ML IJ SOSY
40.0000 mg | PREFILLED_SYRINGE | INTRAMUSCULAR | Status: DC
  Administered 2021-02-03 – 2021-02-04 (×2): 40 mg via SUBCUTANEOUS
  Filled 2021-02-03 (×2): qty 0.4

## 2021-02-03 MED ORDER — SODIUM CHLORIDE 0.9 % IV SOLN: INTRAVENOUS | Status: DC

## 2021-02-03 MED ORDER — ADULT MULTIVITAMIN W/MINERALS CH
1.0000 | ORAL_TABLET | Freq: Every day | ORAL | Status: DC
  Administered 2021-02-03 – 2021-02-05 (×3): 1 via ORAL
  Filled 2021-02-03 (×3): qty 1

## 2021-02-03 MED ORDER — THIAMINE HCL 100 MG PO TABS
100.0000 mg | ORAL_TABLET | Freq: Every day | ORAL | Status: DC
  Administered 2021-02-03 – 2021-02-05 (×3): 100 mg via ORAL
  Filled 2021-02-03 (×3): qty 1

## 2021-02-03 MED ORDER — ATENOLOL 100 MG PO TABS
100.0000 mg | ORAL_TABLET | Freq: Every day | ORAL | Status: DC
  Administered 2021-02-03 – 2021-02-05 (×3): 100 mg via ORAL
  Filled 2021-02-03: qty 4
  Filled 2021-02-03: qty 1
  Filled 2021-02-03 (×2): qty 4

## 2021-02-03 NOTE — ED Notes (Signed)
Pt taken to the restroom by staff and c/o being unable to void, but patient had urine incontinence on himself.

## 2021-02-03 NOTE — ED Notes (Signed)
Pt ambulated to bedside commode x1 assist.

## 2021-02-03 NOTE — ED Notes (Signed)
Patient denies SI/HI at this time

## 2021-02-03 NOTE — BH Assessment (Signed)
Comprehensive Clinical Assessment (CCA) Note  02/03/2021 Derrick Jones [id number]  Chief Complaint: Patient is a 75 year old male presenting to [facility] ED voluntarily for alcohol detox. Per triage note Pt in via EMS from home. Pt reports detoxing from ETOH by himself at home and called EMS because he could mot urinate. EMS reports pt able to urinate on their stretcher. During assessment patient appears alert and oriented x4, calm and cooperative, mood appears pleasant. While patient was in the ED he had expressed some SI to his nurse earlier. Patient reports he has been drinking since he was 75 years old. Patient reports that he has no prior sobriety and has been drinking daily. Patient reports he has a history of depression and is being prescribed Zoloft by his primary care doctor. Patient is now currently denying after asked twice during the assessment. Patient reports that he wishes to detox here in the hospital. Patient denies SI/HI/AH/VH and does not appear to be responding to any internal or external stimuli. Chief Complaint  Patient presents with   Dysuria   Delirium Tremens (DTS)   Suicidal   Visit Diagnosis: Alcohol Use Disorder, Severe    CCA Screening, Triage and Referral (STR)  Patient Reported Information How did you hear about [place]? Self  Referral name: No data recorded Referral phone number: No data recorded  Whom do you see for routine medical problems? No data recorded Practice/Facility Name: No data recorded Practice/Facility Phone Number: No data recorded Name of Contact: No data recorded Contact Number: No data recorded Contact Fax Number: No data recorded Prescriber Name: No data recorded Prescriber Address (if known): No data recorded  What Is the Reason for Your Visit/Call Today? Patient is presenting voluntary for alcohol detox  How Long Has This Been Causing You Problems? > than 6 months  What Do You Feel Would Help You the Most Today? No data recorded  Have You  Recently Been in Any Inpatient Treatment (Hospital/Detox/Crisis Center/28-Day Program)? No data recorded Name/Location of Program/Hospital:No data recorded How Long Were You There? No data recorded When Were You Discharged? No data recorded  Have You Ever Received Services From [facility] Before? No data recorded Who Do You See at [facility]? No data recorded  Have You Recently Had Any Thoughts About Hurting Yourself? No  Are You Planning to Commit Suicide/Harm Yourself At This time? No   Have you Recently Had Thoughts About [place]? No  Explanation: No data recorded  Have You Used Any Alcohol or Drugs in the Past 24 Hours? No  How Long Ago Did You Use Drugs or Alcohol? No data recorded What Did You Use and How Much? No data recorded  Do You Currently Have a Therapist/Psychiatrist? No  Name of Therapist/Psychiatrist: No data recorded  Have You Been Recently Discharged From Any Office Practice or Programs? No  Explanation of Discharge From Practice/Program: No data recorded    CCA Screening Triage Referral Assessment Type of Contact: Face-to-Face  Is this Initial or Reassessment? No data recorded Date Telepsych consult ordered in [facility]:  No data recorded Time Telepsych consult ordered in [facility]:  No data recorded  Patient Reported Information Reviewed? No data recorded Patient Left Without Being Seen? No data recorded Reason for Not Completing Assessment: No data recorded  Collateral Involvement: No data recorded  Does Patient Have a Robins? No data recorded Name and Contact of Legal Guardian: No data recorded If Minor and Not Living with Parent(s), Who has  Custody? No data recorded Is CPS involved or ever been involved? Never  Is APS involved or ever been involved? Never   Patient Determined To Be At Risk for Harm To Self or Others Based on Review of Patient Reported Information or Presenting Complaint? No  Method: No data  recorded Availability of Means: No data recorded Intent: No data recorded Notification Required: No data recorded Additional Information for Danger to Others Potential: No data recorded Additional Comments for Danger to Others Potential: No data recorded Are There Guns or Other Weapons in Your Home? No data recorded Types of Guns/Weapons: No data recorded Are These Weapons Safely Secured?                            No data recorded Who Could Verify You Are Able To Have These Secured: No data recorded Do You Have any Outstanding Charges, Pending Court Dates, Parole/Probation? No data recorded Contacted To Inform of Risk of Harm To Self or Others: No data recorded  Location of Assessment: [facility] ED   Does Patient Present under Involuntary Commitment? No  IVC Papers Initial File Date: No data recorded  [place] of Residence: [place]   Patient Currently Receiving the Following Services: No data recorded  Determination of Need: Emergent (2 hours)   Options For Referral: No data recorded    CCA Biopsychosocial Intake/Chief Complaint:  No data recorded Current Symptoms/Problems: No data recorded  Patient Reported Schizophrenia/Schizoaffective Diagnosis in Past: No   Strengths: Patient is able to communicate  Preferences: No data recorded Abilities: No data recorded  Type of Services Patient Feels are Needed: No data recorded  Initial Clinical Notes/Concerns: No data recorded  Mental Health Symptoms Depression:   None   Duration of Depressive symptoms: No data recorded  Mania:   None   Anxiety:    None   Psychosis:   None   Duration of Psychotic symptoms: No data recorded  Trauma:   None   Obsessions:   None   Compulsions:   None   Inattention:   None   Hyperactivity/Impulsivity:   None   Oppositional/Defiant Behaviors:   None   Emotional Irregularity:   None   Other Mood/Personality Symptoms:  No data recorded   Mental Status  Exam Appearance and self-care  Stature:   Average   Weight:   Average weight   Clothing:   Casual   Grooming:   Normal   Cosmetic use:   None   Posture/gait:   Normal   Motor activity:   Not Remarkable   Sensorium  Attention:   Normal   Concentration:   Normal   Orientation:   X5   Recall/memory:   Normal   Affect and Mood  Affect:   Appropriate   Mood:   Other (Comment)   Relating  Eye contact:   Normal   Facial expression:   Responsive   Attitude toward examiner:   Cooperative   Thought and Language  Speech flow:  Clear and Coherent   Thought content:   Appropriate to Mood and Circumstances   Preoccupation:   None   Hallucinations:   None   Organization:  No data recorded  [employer] of Knowledge:   Good   Intelligence:   Average   Abstraction:   Normal   Judgement:   Good   Reality Testing:   Realistic   Insight:   Good   Decision Making:  Normal   Social Functioning  Social Maturity:   Responsible   Social Judgement:   Normal   Stress  Stressors:   Other (Comment)   Coping Ability:   Normal   Skill Deficits:   None   Supports:   Family     Religion: Religion/Spirituality Are You A Religious Person?: No  Leisure/Recreation: Leisure / Recreation Do You Have Hobbies?: No  Exercise/Diet: Exercise/Diet Do You Exercise?: No Have You Gained or Lost A Significant Amount of Weight in the Past Six Months?: No Do You Follow a Special Diet?: No Do You Have Any Trouble Sleeping?: No   CCA Employment/Education Employment/Work Situation: Employment / Work Technical sales engineer: Retired Has Patient ever Been in Passenger transport manager?: No  Education: Education Is Patient Currently Attending School?: No Did You Have An Individualized Education Program (IIEP): No Did You Have Any Difficulty At [employer]?: No Patient's Education Has Been Impacted by Current Illness: No   CCA  Family/Childhood History Family and Relationship History: Family history Marital status: Married What types of issues is patient dealing with in the relationship?: None reported Additional relationship information: None reported  Childhood History:  Childhood History Did patient suffer any verbal/emotional/physical/sexual abuse as a child?: No Did patient suffer from severe childhood neglect?: No Has patient ever been sexually abused/assaulted/raped as an adolescent or adult?: No Was the patient ever a victim of a crime or a disaster?: No Witnessed domestic violence?: No Has patient been affected by domestic violence as an adult?: No  Child/Adolescent Assessment:     CCA Substance Use Alcohol/Drug Use: Alcohol / Drug Use Pain Medications: See MAR Prescriptions: See MAR Over the Counter: See MAR History of alcohol / drug use?: Yes Withdrawal Symptoms: Change in blood pressure, Nausea / Vomiting, Patient aware of relationship between substance abuse and physical/medical complications Substance #1 Name of Substance 1: Alcohol 1 - Age of First Use: 11 1 - Amount (size/oz): Unknown 1 - Frequency: daily 1 - Last Use / Amount: 02/02/21 1- Route of Use: Oral                       ASAM's:  Six Dimensions of Multidimensional Assessment  Dimension 1:  Acute Intoxication and/or Withdrawal Potential:      Dimension 2:  Biomedical Conditions and Complications:      Dimension 3:  Emotional, Behavioral, or Cognitive Conditions and Complications:     Dimension 4:  Readiness to Change:     Dimension 5:  Relapse, Continued use, or Continued Problem Potential:     Dimension 6:  Recovery/Living Environment:     ASAM Severity Score:    ASAM Recommended Level of Treatment:     Substance use Disorder (SUD) Substance Use Disorder (SUD)  Checklist Symptoms of Substance Use: Continued use despite having a persistent/recurrent physical/psychological problem caused/exacerbated by use,  Evidence of tolerance, Evidence of withdrawal (Comment), Presence of craving or strong urge to use, Social, occupational, recreational activities given up or reduced due to use, Recurrent use that results in a failure to fulfill major role obligations (work, school, home), Large amounts of time spent to obtain, use or recover from the substance(s), Substance(s) often taken in larger amounts or over longer times than was intended, Continued use despite persistent or recurrent social, interpersonal problems, caused or exacerbated by use, Persistent desire or unsuccessful efforts to cut down or control use, Repeated use in physically hazardous situations  Recommendations for Services/Supports/Treatments:  Patient is being admitted medically for  alcohol detox, patient did express the need to go to long term rehab once he has completed detox here in the hospital. [facility] has been consulted to assist with that. If patient expresses SI again a Psyc consult will need to be placed. TTS consult completed  DSM5 Diagnoses: Patient Active Problem List   Diagnosis Date Noted   Alcohol withdrawal ([place]) 02/03/2021   Suicidal ideation 02/03/2021   Hyponatremia 02/03/2021   Closed left hip fracture ([place]) 05/03/2020   Intertrochanteric fracture of left femur ([place]) 05/02/2020   Leukocytosis 05/02/2020   Fall 05/02/2020   COVID-19 virus detected 05/02/2020   Intertrochanteric fracture of left hip ([place]) 05/02/2020   Closed displaced intertrochanteric fracture of left femur ([place]) 05/02/2020   Alcohol use with withdrawal delirium ([place]) 05/02/2020   Alcohol abuse 02/27/2020   Concussion wth loss of consciousness of 30 minutes or less 07/86/7544   Alcoholic ketoacidosis 92/08/69   History of alcohol abuse 08/02/2017   Hypercholesterolemia 08/02/2017   Heartburn 07/02/2017   Prediabetes 05/27/2017   Adjustment disorder with mixed anxiety and depressed mood 05/11/2017   Anxiety 05/11/2017   Hypertension 05/11/2017    Insomnia 05/11/2017   History of gastric ulcer 05/11/2017   History of esophageal stricture 05/11/2017    Patient Centered Plan: Patient is on the following Treatment Plan(s):  Substance Abuse   Referrals to Alternative Service(s): Referred to Alternative Service(s):   Place:   Date:   Time:    Referred to Alternative Service(s):   Place:   Date:   Time:    Referred to Alternative Service(s):   Place:   Date:   Time:    Referred to Alternative Service(s):   Place:   Date:   Time:     Raziah Funnell A Dennice Tindol, LCAS-A

## 2021-02-03 NOTE — ED Notes (Signed)
While administering medication, pt states "What if a man wants to kill himself?" When asked if patient is having thoughts of harming himself, pt initially replied "no" and laughed it off. After reassuring pt that the ER can help if he is having suicidal ideations, pt became tearful the replied "yes, I am having thoughts like that and I think I need some help." Pt denies a plan to harm himself at this time. PA notified.

## 2021-02-03 NOTE — ED Triage Notes (Signed)
Pt in via EMS from home. Pt reports detoxing from ETOH by himself at home and called EMS because he could mot urinate. EMS reports pt able to urinate on their stretcher.

## 2021-02-03 NOTE — ED Triage Notes (Addendum)
C/O being unable to void x 2 days. Patient noted to be incontinent of urine.  Skin care given.. While in triage, patient having urge to void, only able to pass small amount of urine.  Patient also c/o shaking.  STates he had something to drink last week and he has been shaking since.  C/O being anxious.

## 2021-02-03 NOTE — ED Provider Notes (Signed)
[facility]-EMERGENCY DEPARTMENT  ____________________________________________  Time seen: Approximately 3:55 PM  I have reviewed the triage vital signs and the nursing notes.   HISTORY  Chief Complaint Dysuria, Delirium Tremens (DTS), and Suicidal   Historian Patient    HPI Derrick Jones is a 75 y.o. male with a history of diabetes and hypertension and alcohol use, presents to the emergency department with withdrawal symptoms from alcohol.  Patient reports that he quit drinking on Friday.  He drinks about a fifth of liquor a day.  Patient reports that he has had excruciating headache at home, whole body tremors and difficulty voiding.  He denies chest pain, chest tightness or abdominal pain.  Wife reports that he has been agitated at home and experiencing drenching sweats.  He denies visual or auditory hallucinations.  He has been alert and oriented according to his wife.  Patient is interested in receiving admission for alcohol withdrawal.   Past Medical History:  Diagnosis Date   Diabetes mellitus without complication ([place])    GERD (gastroesophageal reflux disease)    Hypertension      Immunizations up to date:  Yes.     Past Medical History:  Diagnosis Date   Diabetes mellitus without complication ([place])    GERD (gastroesophageal reflux disease)    Hypertension     Patient Active Problem List   Diagnosis Date Noted   Alcohol withdrawal ([place]) 02/03/2021   Suicidal ideation 02/03/2021   Hyponatremia 02/03/2021   Closed left hip fracture ([place]) 05/03/2020   Intertrochanteric fracture of left femur ([place]) 05/02/2020   Leukocytosis 05/02/2020   Fall 05/02/2020   COVID-19 virus detected 05/02/2020   Intertrochanteric fracture of left hip ([place]) 05/02/2020   Closed displaced intertrochanteric fracture of left femur ([place]) 05/02/2020   Alcohol use with withdrawal delirium ([place]) 05/02/2020   Alcohol abuse 02/27/2020   Concussion wth loss of consciousness of 30 minutes or less  07/37/1062   Alcoholic ketoacidosis 69/48/5462   History of alcohol abuse 08/02/2017   Hypercholesterolemia 08/02/2017   Heartburn 07/02/2017   Prediabetes 05/27/2017   Adjustment disorder with mixed anxiety and depressed mood 05/11/2017   Anxiety 05/11/2017   Hypertension 05/11/2017   Insomnia 05/11/2017   History of gastric ulcer 05/11/2017   History of esophageal stricture 05/11/2017    Past Surgical History:  Procedure Laterality Date   COLONOSCOPY WITH PROPOFOL N/A 07/21/2019   Procedure: COLONOSCOPY WITH PROPOFOL;  Surgeon: Jonathon Bellows, MD;  Location[facility] ENDOSCOPY;  Service: Gastroenterology;  Laterality: N/A;   ESOPHAGOGASTRODUODENOSCOPY (EGD) WITH PROPOFOL N/A 03/11/2020   Procedure: ESOPHAGOGASTRODUODENOSCOPY (EGD) WITH PROPOFOL;  Surgeon: Jonathon Bellows, MD;  Location: [facility] ENDOSCOPY;  Service: Gastroenterology;  Laterality: N/A;   ESOPHAGOGASTRODUODENOSCOPY (EGD) WITH PROPOFOL N/A 04/11/2020   Procedure: ESOPHAGOGASTRODUODENOSCOPY (EGD) WITH PROPOFOL;  Surgeon: Jonathon Bellows, MD;  Location: [facility] ENDOSCOPY;  Service: Gastroenterology;  Laterality: N/A;   ESOPHAGOGASTRODUODENOSCOPY (EGD) WITH PROPOFOL N/A 07/29/2020   Procedure: ESOPHAGOGASTRODUODENOSCOPY (EGD) WITH PROPOFOL;  Surgeon: Jonathon Bellows, MD;  Location: [facility] ENDOSCOPY;  Service: Gastroenterology;  Laterality: N/A;   EYE SURGERY     FEMUR FRACTURE SURGERY Left    FRACTURE SURGERY     INTRAMEDULLARY (IM) NAIL INTERTROCHANTERIC Left 05/03/2020   Procedure: INTRAMEDULLARY (IM) NAIL INTERTROCHANTRIC;  Surgeon: Leim Fabry, MD;  Location: [facility] ORS;  Service: Orthopedics;  Laterality: Left;    Prior to Admission medications   Medication Sig Start Date End Date Taking? Authorizing Provider  atenolol (TENORMIN) 100 MG tablet Take 1 tablet by mouth once daily 11/25/20  Cletis Athens, MD  atorvastatin (LIPITOR) 20 MG tablet Take 20 mg by mouth daily. 11/14/19   [provider]  enoxaparin (LOVENOX) 40 MG/0.4ML injection  Inject 0.4 mLs (40 mg total) into the skin daily for 14 days. 05/06/20 05/20/20  Reche Dixon, PA-C  melatonin 3 MG TABS tablet Take 3 mg by mouth at bedtime. Patient not taking: Reported on 07/29/2020    [provider]  metFORMIN (GLUCOPHAGE) 500 MG tablet Take 1 tablet (500 mg total) by mouth daily. 12/02/20   Cletis Athens, MD  omeprazole (PRILOSEC) 20 MG capsule Take 20 mg by mouth daily.    [provider]  omeprazole (PRILOSEC) 40 MG capsule Take 1 capsule (40 mg total) by mouth 2 (two) times daily before a meal. 05/06/20   Darliss Cheney, MD  oxyCODONE (OXY IR/ROXICODONE) 5 MG immediate release tablet Take 0.5-1 tablets (2.5-5 mg total) by mouth every 4 (four) hours as needed for moderate pain or severe pain. Patient not taking: Reported on 06/05/2020 05/07/20   Darliss Cheney, MD  traMADol (ULTRAM) 50 MG tablet Take 1 tablet (50 mg total) by mouth every 6 (six) hours as needed for moderate pain. Patient not taking: Reported on 06/05/2020 05/05/20   Reche Dixon, PA-C    Allergies Patient has no known allergies.  Family History  Problem Relation Age of Onset   Breast cancer Sister    Ovarian cancer Sister     Social History Social History   Tobacco Use   Smoking status: Every Day    Packs/day: 1.00    Years: 53.00    Pack years: 53.00    Types: Cigarettes   Smokeless tobacco: Never  Vaping Use   Vaping Use: Never used  Substance Use Topics   Alcohol use: Yes   Drug use: No     Review of Systems  Constitutional: No fever/chills Eyes:  No discharge ENT: No upper respiratory complaints. Respiratory: no cough. No SOB/ use of accessory muscles to breath Gastrointestinal: Patient has nausea, vomiting and dry heaves. Musculoskeletal: Negative for musculoskeletal pain. Skin: Negative for rash, abrasions, lacerations, ecchymosis.    ____________________________________________   PHYSICAL EXAM:  VITAL SIGNS: ED Triage Vitals  Enc Vitals Group     BP  02/03/21 1110 (!) 163/94     Pulse Rate 02/03/21 1110 88     Resp 02/03/21 1110 20     Temp 02/03/21 1110 97.7 F (36.5 C)     Temp Source 02/03/21 1110 Oral     SpO2 02/03/21 1110 100 %     Weight 02/03/21 1116 133 lb 6.1 oz (60.5 kg)     Height 02/03/21 1116 5\' 9"  (1.753 m)     Head Circumference --      Peak Flow --      Pain Score 02/03/21 1111 0     Pain Loc --      Pain Edu? --      Excl. in [place]? --      Constitutional: Alert and oriented. Well appearing and in no acute distress. Eyes: Conjunctivae are normal. PERRL. EOMI. Head: Atraumatic. ENT:      Ears: TMs are pearly.      Nose: No congestion/rhinnorhea.      Mouth/Throat: Mucous membranes are moist.  Neck: No stridor.  No cervical spine tenderness to palpation. Cardiovascular: Normal rate, regular rhythm. Normal S1 and S2.  Good peripheral circulation. Respiratory: Normal respiratory effort without tachypnea or retractions. Lungs CTAB. Good air entry to the bases  with no decreased or absent breath sounds Gastrointestinal: Bowel sounds x 4 quadrants. Soft and nontender to palpation. No guarding or rigidity. No distention. Musculoskeletal: Full range of motion to all extremities. No obvious deformities noted Neurologic:  Normal for age. No gross focal neurologic deficits are appreciated.  Skin:  Skin is warm, dry and intact. No rash noted. Psychiatric: Mood and affect are normal for age. Speech and behavior are normal.   ____________________________________________   LABS (all labs ordered are listed, but only abnormal results are displayed)  Labs Reviewed  URINALYSIS, COMPLETE (UACMP) WITH MICROSCOPIC - Abnormal; Notable for the following components:      Result Value   Color, Urine YELLOW (*)    APPearance HAZY (*)    Glucose, UA 50 (*)    Protein, ur 30 (*)    All other components within normal limits  CBC WITH DIFFERENTIAL/PLATELET - Abnormal; Notable for the following components:   HCT 37.9 (*)    MCHC  36.1 (*)    All other components within normal limits  COMPREHENSIVE METABOLIC PANEL - Abnormal; Notable for the following components:   Sodium 126 (*)    Chloride 88 (*)    Glucose, Bld 109 (*)    AST 63 (*)    Total Bilirubin 2.5 (*)    All other components within normal limits  RESP PANEL BY RT-PCR (FLU A&B, COVID) ARPGX2  AMMONIA  MAGNESIUM  ETHANOL  ACETAMINOPHEN LEVEL  SALICYLATE LEVEL   ____________________________________________  EKG   ____________________________________________  RADIOLOGY Unk Pinto, personally viewed and evaluated these images (plain radiographs) as part of my medical decision making, as well as reviewing the written report by the radiologist.    No results found.  ____________________________________________    PROCEDURES  Procedure(s) performed:     Procedures     Medications  thiamine (B-1) injection 100 mg (100 mg Intravenous Given 02/03/21 1621)  diazepam (VALIUM) injection 5 mg (5 mg Intravenous Given 02/03/21 1621)  sodium chloride 0.9 % bolus 1,000 mL (1,000 mLs Intravenous Bolus from Bag 02/03/21 1708)     ____________________________________________   INITIAL IMPRESSION / ASSESSMENT AND PLAN / ED COURSE  Pertinent labs & imaging results that were available during my care of the patient were reviewed by me and considered in my medical decision making (see chart for details).       Assessment and plan Difficulty voiding Alcohol withdrawal Headache 75 year old male currently consuming 25 ounces of liquor a day presents to the emergency department with alcohol withdrawal symptoms after abstaining from alcohol starting on Friday.  Patient was hypertensive at triage but vital signs were otherwise reassuring.  Patient had tremor even while at rest and even without hands extended.  He reported increased agitation at home, night sweats, headache and dry heaving/nausea.  Patient is requesting resources with helping  him to withdraw from alcohol.  Patient has no history of seizures and has not had any seizures since patient started alcohol withdrawal process.  Patient's sodium was 126 and T bili elevated at 2.5.  Normal saline was given in the emergency department.  I reached out to hospitalist on-call, Dr. Eliseo Squires and patient was accepted for admission.  Psych consult is in process at this time as patient told RN Thedore Mins "what with the hospital do if a man was thinking of killing himself?".  Patient denied suicidal ideation.    ____________________________________________  FINAL CLINICAL IMPRESSION(S) / ED DIAGNOSES  Final diagnoses:  Alcohol withdrawal syndrome with complication ([place])  Hyponatremia      NEW MEDICATIONS STARTED DURING THIS VISIT:  ED Discharge Orders     None           This chart was dictated using voice recognition software/Dragon. Despite best efforts to proofread, errors can occur which can change the meaning. Any change was purely unintentional.     Lannie Fields, PA-C 02/03/21 1729    Blake Divine, MD 02/03/21 2245

## 2021-02-03 NOTE — ED Notes (Signed)
Patients slippers given to patients wife to take home

## 2021-02-03 NOTE — H&P (Signed)
History and Physical    Derrick Jones ZJI:[id number] DOB: Oct 16, 1945 DOA: 02/03/2021  I have briefly reviewed the patient's prior medical records in [place]  PCP: Cletis Athens, MD  Patient coming from: home  Chief Complaint: alcohol withdrawal  HPI: Derrick Jones is a 75 y.o. male with medical history significant of DM, HTN and binge alcohol use.  Patient says he has been on an alcohol binge for last 1 week-- 1/5th/day.    He has lost 23 pounds in the last week.  He called EMS today as he could not urinate Appears last alcoholic drink was "last week- Friday".    He reports HA, tremors.  Wife reported to the ER agitation and sweats.    In the ER, there were reports of ? Suicidal ideations but patient denies.     Review of Systems: As per HPI otherwise 10 point review of systems negative.   Past Medical History:  Diagnosis Date   Diabetes mellitus without complication (HCC)    GERD (gastroesophageal reflux disease)    Hypertension     Past Surgical History:  Procedure Laterality Date   COLONOSCOPY WITH PROPOFOL N/A 07/21/2019   Procedure: COLONOSCOPY WITH PROPOFOL;  Surgeon: Jonathon Bellows, MD;  Location: [facility] ENDOSCOPY;  Service: Gastroenterology;  Laterality: N/A;   ESOPHAGOGASTRODUODENOSCOPY (EGD) WITH PROPOFOL N/A 03/11/2020   Procedure: ESOPHAGOGASTRODUODENOSCOPY (EGD) WITH PROPOFOL;  Surgeon: Jonathon Bellows, MD;  Location: [facility] ENDOSCOPY;  Service: Gastroenterology;  Laterality: N/A;   ESOPHAGOGASTRODUODENOSCOPY (EGD) WITH PROPOFOL N/A 04/11/2020   Procedure: ESOPHAGOGASTRODUODENOSCOPY (EGD) WITH PROPOFOL;  Surgeon: Jonathon Bellows, MD;  Location: [facility] ENDOSCOPY;  Service: Gastroenterology;  Laterality: N/A;   ESOPHAGOGASTRODUODENOSCOPY (EGD) WITH PROPOFOL N/A 07/29/2020   Procedure: ESOPHAGOGASTRODUODENOSCOPY (EGD) WITH PROPOFOL;  Surgeon: Jonathon Bellows, MD;  Location: [facility] ENDOSCOPY;  Service: Gastroenterology;  Laterality: N/A;   EYE SURGERY     FEMUR FRACTURE SURGERY Left     FRACTURE SURGERY     INTRAMEDULLARY (IM) NAIL INTERTROCHANTERIC Left 05/03/2020   Procedure: INTRAMEDULLARY (IM) NAIL INTERTROCHANTRIC;  Surgeon: Leim Fabry, MD;  Location: [facility] ORS;  Service: Orthopedics;  Laterality: Left;     reports that he has been smoking cigarettes. He has a 53.00 pack-year smoking history. He has never used smokeless tobacco. He reports current alcohol use. He reports that he does not use drugs.  No Known Allergies  Family History  Problem Relation Age of Onset   Breast cancer Sister    Ovarian cancer Sister     Prior to Admission medications   Medication Sig Start Date End Date Taking? Authorizing Provider  atenolol (TENORMIN) 100 MG tablet Take 1 tablet by mouth once daily 11/25/20   Cletis Athens, MD  atorvastatin (LIPITOR) 20 MG tablet Take 20 mg by mouth daily. 11/14/19   [provider]  enoxaparin (LOVENOX) 40 MG/0.4ML injection Inject 0.4 mLs (40 mg total) into the skin daily for 14 days. 05/06/20 05/20/20  Reche Dixon, PA-C  melatonin 3 MG TABS tablet Take 3 mg by mouth at bedtime. Patient not taking: Reported on 07/29/2020    [provider]  metFORMIN (GLUCOPHAGE) 500 MG tablet Take 1 tablet (500 mg total) by mouth daily. 12/02/20   Cletis Athens, MD  omeprazole (PRILOSEC) 20 MG capsule Take 20 mg by mouth daily.    [provider]  omeprazole (PRILOSEC) 40 MG capsule Take 1 capsule (40 mg total) by mouth 2 (two) times daily before a meal. 05/06/20   Darliss Cheney, MD  oxyCODONE (OXY IR/ROXICODONE)  5 MG immediate release tablet Take 0.5-1 tablets (2.5-5 mg total) by mouth every 4 (four) hours as needed for moderate pain or severe pain. Patient not taking: Reported on 06/05/2020 05/07/20   Darliss Cheney, MD  traMADol (ULTRAM) 50 MG tablet Take 1 tablet (50 mg total) by mouth every 6 (six) hours as needed for moderate pain. Patient not taking: Reported on 06/05/2020 05/05/20   Reche Dixon, PA-C    Physical Exam: Vitals:   02/03/21  1110 02/03/21 1116  BP: (!) 163/94   Pulse: 88   Resp: 20   Temp: 97.7 F (36.5 C)   TempSrc: Oral   SpO2: 100%   Weight:  60.5 kg  Height:  5\' 9"  (1.753 m)      Constitutional: NAD, calm, comfortable- tremulous Eyes: PERRL, lids and conjunctivae normal Respiratory: clear to auscultation bilaterally, no wheezing, no crackles. Normal respiratory effort. No accessory muscle use.  Cardiovascular: regular but tachy at times Abdomen: no tenderness, no masses palpated. Bowel sounds positive.  Musculoskeletal: thin male. Normal muscle tone.  Neurologic: has tremor Psychiatric: denies suicidal or homicidal ideations  Labs on Admission: I have personally reviewed following labs and imaging studies  CBC: Recent Labs  Lab 02/03/21 1542  WBC 9.2  NEUTROABS 7.2  HGB 13.7  HCT 37.9*  MCV 87.9  PLT 735   Basic Metabolic Panel: Recent Labs  Lab 02/03/21 1542  NA 126*  K 4.6  CL 88*  CO2 27  GLUCOSE 109*  BUN 14  CREATININE 0.85  CALCIUM 9.4  MG 1.9   GFR: Estimated Creatinine Clearance: 65.2 mL/min (by C-G formula based on SCr of 0.85 mg/dL). Liver Function Tests: Recent Labs  Lab 02/03/21 1542  AST 63*  ALT 33  ALKPHOS 60  BILITOT 2.5*  PROT 7.3  ALBUMIN 4.5   No results for input(s): LIPASE, AMYLASE in the last 168 hours. Recent Labs  Lab 02/03/21 1542  AMMONIA 13   Coagulation Profile: No results for input(s): INR, PROTIME in the last 168 hours. Cardiac Enzymes: No results for input(s): CKTOTAL, CKMB, CKMBINDEX, TROPONINI in the last 168 hours. BNP (last 3 results) No results for input(s): PROBNP in the last 8760 hours. HbA1C: No results for input(s): HGBA1C in the last 72 hours. CBG: No results for input(s): GLUCAP in the last 168 hours. Lipid Profile: No results for input(s): CHOL, HDL, LDLCALC, TRIG, CHOLHDL, LDLDIRECT in the last 72 hours. Thyroid Function Tests: No results for input(s): TSH, T4TOTAL, FREET4, T3FREE, THYROIDAB in the last 72  hours. Anemia Panel: No results for input(s): VITAMINB12, FOLATE, FERRITIN, TIBC, IRON, RETICCTPCT in the last 72 hours. Urine analysis:    Component Value Date/Time   COLORURINE YELLOW (A) 02/03/2021 1123   APPEARANCEUR HAZY (A) 02/03/2021 1123   APPEARANCEUR Clear 02/08/2013 1549   LABSPEC 1.010 02/03/2021 1123   LABSPEC 1.015 02/08/2013 1549   PHURINE 5.0 02/03/2021 1123   GLUCOSEU 50 (A) 02/03/2021 1123   GLUCOSEU Negative 02/08/2013 1549   HGBUR NEGATIVE 02/03/2021 1123   BILIRUBINUR NEGATIVE 02/03/2021 1123   BILIRUBINUR Negative 02/08/2013 1549   KETONESUR NEGATIVE 02/03/2021 1123   PROTEINUR 30 (A) 02/03/2021 1123   NITRITE NEGATIVE 02/03/2021 1123   LEUKOCYTESUR NEGATIVE 02/03/2021 1123   LEUKOCYTESUR Negative 02/08/2013 1549     Radiological Exams on Admission: No results found.   Assessment/Plan Active Problems:   Hypertension   Alcohol abuse   Alcohol withdrawal (HCC)   Suicidal ideation   Hyponatremia    Acute alcohol withdrawal -  CIWA protocol -TOC consult  Hyponatremia -daily labs -gentle IVF overnight -regular diet  DM -SSI -hold metformin  HTN -resume home meds  Weight loss -nutrition consult -regular diet (may need carb mod)  Denies suicidal ideations -psych consulted in ER  Difficulty urinating -check PVR -may need flomax  Tobacco abuse -patch ordered -encouraged cessation   DVT prophylaxis: lovenox Code Status: full  Family Communication: none at bedside Disposition Plan: home ? Consults called: psych (ER called for reported suicidal ideations-- patient denies)    Admission status: inpt    At the time of admission, it appears that the appropriate admission status for this patient is INPATIENT. This is judged to be reasonable and necessary in order to provide the required high service intensity to ensure the patient's safety given the presenting symptoms, physical exam findings, and initial radiographic and laboratory  data in the context of their chronic comorbidities. Current circumstances are alcohol withdrawal, and it is felt to place patient at high risk for further clinical deterioration threatening life, limb, or organ. Moreover, it is my clinical judgment that the patient will require inpatient hospital care spanning beyond 2 midnights from the point of admission and that early discharge would result in unnecessary risk of decompensation and readmission or threat to life, limb or bodily function.   Geradine Girt [facility]   How to contact the [facility] Attending or Consulting provider [place] or covering provider during after hours [place], for this patient?  Check the care team in [facility] and look for a) attending/consulting [facility] provider listed and b) the [facility] team listed Log into [email or web address] and use [place]'s universal password to access. If you do not have the password, please contact the hospital operator. Locate the [facility] provider you are looking for under [facility] and page to a number that you can be directly reached. If you still have difficulty reaching the provider, please page the [facility] (Director on Call) for the Hospitalists listed on amion for assistance.  02/03/2021, 5:09 PM

## 2021-02-04 ENCOUNTER — Encounter: Payer: Self-pay | Admitting: Internal Medicine

## 2021-02-04 DIAGNOSIS — F10230 Alcohol dependence with withdrawal, uncomplicated: Secondary | ICD-10-CM

## 2021-02-04 LAB — COMPREHENSIVE METABOLIC PANEL
ALT: 27 U/L (ref 0–44)
AST: 49 U/L — ABNORMAL HIGH (ref 15–41)
Albumin: 4 g/dL (ref 3.5–5.0)
Alkaline Phosphatase: 57 U/L (ref 38–126)
Anion gap: 9 (ref 5–15)
BUN: 11 mg/dL (ref 8–23)
CO2: 25 mmol/L (ref 22–32)
Calcium: 8.7 mg/dL — ABNORMAL LOW (ref 8.9–10.3)
Chloride: 95 mmol/L — ABNORMAL LOW (ref 98–111)
Creatinine, Ser: 0.68 mg/dL (ref 0.61–1.24)
GFR, Estimated: >60 mL/min (ref >60)
Glucose, Bld: 95 mg/dL (ref 70–99)
Potassium: 4.1 mmol/L (ref 3.5–5.1)
Sodium: 129 mmol/L — ABNORMAL LOW (ref 135–145)
Total Bilirubin: 2.4 mg/dL — ABNORMAL HIGH (ref 0.3–1.2)
Total Protein: 7 g/dL (ref 6.5–8.1)

## 2021-02-04 LAB — GLUCOSE, CAPILLARY: Glucose-Capillary: 92 mg/dL (ref 70–99)

## 2021-02-04 LAB — CBC
HCT: 39.5 % (ref 39.0–52.0)
Hemoglobin: 13.7 g/dL (ref 13.0–17.0)
MCH: 30.9 pg (ref 26.0–34.0)
MCHC: 34.7 g/dL (ref 30.0–36.0)
MCV: 89.2 fL (ref 80.0–100.0)
Platelets: 243 10*3/uL (ref 150–400)
RBC: 4.43 MIL/uL (ref 4.22–5.81)
RDW: 14.4 % (ref 11.5–15.5)
WBC: 9.3 10*3/uL (ref 4.0–10.5)
nRBC: 0 % (ref 0.0–0.2)

## 2021-02-04 LAB — CBG MONITORING, ED
Glucose-Capillary: 125 mg/dL — ABNORMAL HIGH (ref 70–99)
Glucose-Capillary: 124 mg/dL — ABNORMAL HIGH (ref 70–99)

## 2021-02-04 MED ORDER — TAMSULOSIN HCL 0.4 MG PO CAPS
0.4000 mg | ORAL_CAPSULE | Freq: Every day | ORAL | Status: DC
  Administered 2021-02-04: 0.4 mg via ORAL
  Filled 2021-02-04 (×2): qty 1

## 2021-02-04 MED ORDER — HYDRALAZINE HCL 10 MG PO TABS
10.0000 mg | ORAL_TABLET | Freq: Three times a day (TID) | ORAL | Status: DC
  Administered 2021-02-05: 06:00:00 10 mg via ORAL
  Filled 2021-02-04 (×4): qty 1

## 2021-02-04 MED ORDER — HYDRALAZINE HCL 20 MG/ML IJ SOLN
5.0000 mg | Freq: Four times a day (QID) | INTRAMUSCULAR | Status: DC | PRN
  Administered 2021-02-04: 5 mg via INTRAVENOUS
  Filled 2021-02-04: qty 1

## 2021-02-04 MED ORDER — CHLORHEXIDINE GLUCONATE CLOTH 2 % EX PADS
6.0000 | MEDICATED_PAD | Freq: Every day | CUTANEOUS | Status: DC
  Administered 2021-02-04 – 2021-02-05 (×2): 6 via TOPICAL

## 2021-02-04 NOTE — Progress Notes (Signed)
Patient arrived to unit in stable condition from ER.

## 2021-02-04 NOTE — ED Notes (Signed)
Dee RN aware of assigned bed 

## 2021-02-04 NOTE — Progress Notes (Signed)
Patient states he stopped drinking due to "feeling so sick." Pt states his last drink was "over a week ago." Pt states he normally drinks both liquor and beer. He states the last time he drank he drank "excessively" because he "had some extra money." Pt also endorses having not taken any of his medications in "about a week." When asked why he just shrugs his shoulders and says "I was drinking instead." Pt denies any suicidal or homicidal ideations at present. Pt encouraged to verbalize any occurrence of these to staff. Pt agreeable.

## 2021-02-04 NOTE — Progress Notes (Signed)
PROGRESS NOTE    Derrick Jones  YQI:[id number] DOB: April 08, 1946 DOA: 02/03/2021 PCP: Cletis Athens, MD   Chief Complaint  Patient presents with   Dysuria   Delirium Tremens (DTS)   Suicidal    Brief Narrative:    ROBI Jones is a 75 y.o. male with medical history significant of DM, HTN and binge alcohol use.  Patient says he has been on an alcohol binge for last 1 week-- 1/5th/day.    He has lost 23 pounds in the last week.  He called EMS today as he could not urinate, he had urinary retention, there was some questionable suicidal ideation, patient denies that, he was seen by psych with no recommendation for IVC or inpatient psych placement.   Assessment & Plan:   Active Problems:   Hypertension   Alcohol abuse   Alcohol withdrawal (HCC)   Suicidal ideation   Hyponatremia  Alcohol abuse/withdrawals -  continue with thiamine, folic acid and CIWA protocol, he did require so far 6 mg of Ativan and less than 24 hours    Hyponatremia -Likely due to alcohol abuse, improving with IV fluids  Urinary retention -Bladder scan showing 1200 cc, requiring Foley catheter insertion, he was started on Flomax.   DM -SSI -hold metformin   HTN -Continue with home medications   Questionable suicidal ideations  -Patient denies any suicidal thoughts or ideations, he was seen by psych, no indication for IVC or inpatient hospitalization  Tobacco abuse -patch ordered -encouraged cessation   DVT prophylaxis: Lovenox Code Status: (Full Family Communication: Wife at bedside Disposition:   Status is: Inpatient  Not inpatient appropriate, will call UM team and downgrade to OBS.   Dispo: The patient is from: Home              Anticipated d/c is to: Home              Patient currently is not medically stable to d/c.   Difficult to place patient No       Consultants:  Psychiatry   Subjective:  Patient reports difficulty to urination, he denies any suicidal thoughts or  ideations.  Objective: Vitals:   02/04/21 0547 02/04/21 1200 02/04/21 1247 02/04/21 1406  BP: (!) 161/109 (!) 136/111  (!) 140/93  Pulse: 84 90 92 88  Resp: 16 17  18   Temp:  98.2 F (36.8 C)  98.1 F (36.7 C)  TempSrc:  Oral  Oral  SpO2: 96% 100%  100%  Weight:      Height:        Intake/Output Summary (Last 24 hours) at 02/04/2021 1427 Last data filed at 02/04/2021 1425 Gross per 24 hour  Intake 1588.74 ml  Output 1025 ml  Net 563.74 ml   Filed Weights   02/03/21 1116  Weight: 60.5 kg    Examination:  Awake Alert, Oriented X 3, with some confusion, and decreased cognition and insight  symmetrical Chest wall movement, Good air movement bilaterally, CTAB RRR,No Gallops,Rubs or new Murmurs, No Parasternal Heave +ve B.Sounds, Abd Soft, suprapubic fullness and tenderness, no rebound - guarding or rigidity. No Cyanosis, Clubbing or edema, No new Rash or bruise      Data Reviewed: I have personally reviewed following labs and imaging studies  CBC: Recent Labs  Lab 02/03/21 1542 02/04/21 0344  WBC 9.2 9.3  NEUTROABS 7.2  --   HGB 13.7 13.7  HCT 37.9* 39.5  MCV 87.9 89.2  PLT 242 243    Basic  Metabolic Panel: Recent Labs  Lab 02/03/21 1542 02/03/21 1830 02/04/21 0344  NA 126*  --  129*  K 4.6  --  4.1  CL 88*  --  95*  CO2 27  --  25  GLUCOSE 109*  --  95  BUN 14  --  11  CREATININE 0.85  --  0.68  CALCIUM 9.4  --  8.7*  MG 1.9  --   --   PHOS  --  2.6  --     GFR: Estimated Creatinine Clearance: 69.3 mL/min (by C-G formula based on SCr of 0.68 mg/dL).  Liver Function Tests: Recent Labs  Lab 02/03/21 1542 02/04/21 0344  AST 63* 49*  ALT 33 27  ALKPHOS 60 57  BILITOT 2.5* 2.4*  PROT 7.3 7.0  ALBUMIN 4.5 4.0    CBG: Recent Labs  Lab 02/03/21 2113 02/04/21 0918 02/04/21 1208  GLUCAP 103* 125* 124*     Recent Results (from the past 240 hour(s))  Resp Panel by RT-PCR (Flu A&B, Covid) Nasopharyngeal Swab     Status: None    Collection Time: 02/03/21  5:18 PM   Specimen: Nasopharyngeal Swab; Nasopharyngeal(NP) swabs in vial transport medium  Result Value Ref Range Status   SARS Coronavirus 2 by RT PCR NEGATIVE NEGATIVE Final    Comment: (NOTE) SARS-CoV-2 target nucleic acids are NOT DETECTED.  The SARS-CoV-2 RNA is generally detectable in upper respiratory specimens during the acute phase of infection. The lowest concentration of SARS-CoV-2 viral copies this assay can detect is 138 copies/mL. A negative result does not preclude SARS-Cov-2 infection and should not be used as the sole basis for treatment or other patient management decisions. A negative result may occur with  improper specimen collection/handling, submission of specimen other than nasopharyngeal swab, presence of viral mutation(s) within the areas targeted by this assay, and inadequate number of viral copies(<138 copies/mL). A negative result must be combined with clinical observations, patient history, and epidemiological information. The expected result is Negative.  Fact Sheet for Patients:  EntrepreneurPulse.com.au  Fact Sheet for Healthcare Providers:  IncredibleEmployment.be  This test is no t yet approved or cleared by the [place] FDA and  has been authorized for detection and/or diagnosis of SARS-CoV-2 by FDA under an Emergency Use Authorization (EUA). This EUA will remain  in effect (meaning this test can be used) for the duration of the COVID-19 declaration under Section 564(b)(1) of the Act, 21 U.S.C.section 360bbb-3(b)(1), unless the authorization is terminated  or revoked sooner.       Influenza A by PCR NEGATIVE NEGATIVE Final   Influenza B by PCR NEGATIVE NEGATIVE Final    Comment: (NOTE) The Xpert Xpress SARS-CoV-2/FLU/RSV plus assay is intended as an aid in the diagnosis of influenza from Nasopharyngeal swab specimens and should not be used as a sole basis for treatment.  Nasal washings and aspirates are unacceptable for Xpert Xpress SARS-CoV-2/FLU/RSV testing.  Fact Sheet for Patients: EntrepreneurPulse.com.au  Fact Sheet for Healthcare Providers: IncredibleEmployment.be  This test is not yet approved or cleared by the [place] FDA and has been authorized for detection and/or diagnosis of SARS-CoV-2 by FDA under an Emergency Use Authorization (EUA). This EUA will remain in effect (meaning this test can be used) for the duration of the COVID-19 declaration under Section 564(b)(1) of the Act, 21 U.S.C. section 360bbb-3(b)(1), unless the authorization is terminated or revoked.  Performed at [facility], [address] [place], [place] [address]  Radiology Studies: No results found.      Scheduled Meds:  atenolol  100 mg Oral Daily   enoxaparin (LOVENOX) injection  40 mg Subcutaneous X32G   folic acid  1 mg Oral Daily   insulin aspart  0-9 Units Subcutaneous TID WC   melatonin  2.5 mg Oral QHS   multivitamin with minerals  1 tablet Oral Daily   nicotine  14 mg Transdermal Daily   pantoprazole  80 mg Oral Daily   tamsulosin  0.4 mg Oral QPC supper   thiamine  100 mg Oral Daily   Or   thiamine  100 mg Intravenous Daily   Continuous Infusions:  sodium chloride 75 mL/hr at 02/04/21 1215     LOS: 1 day       Phillips Climes, MD [facility]   To contact the attending provider between 7A-7P or the covering provider during after hours 7P-7A, please log into the web site [email or web address] and access using universal [place] password for that web site. If you do not have the password, please call the hospital operator.  02/04/2021, 2:27 PM

## 2021-02-04 NOTE — ED Notes (Signed)
Pt's underwear and pants soiled, pt cleaned by this tech and changed into new underwear and gown.

## 2021-02-05 DIAGNOSIS — R338 Other retention of urine: Secondary | ICD-10-CM

## 2021-02-05 DIAGNOSIS — E785 Hyperlipidemia, unspecified: Secondary | ICD-10-CM

## 2021-02-05 DIAGNOSIS — F10239 Alcohol dependence with withdrawal, unspecified: Secondary | ICD-10-CM

## 2021-02-05 DIAGNOSIS — I10 Essential (primary) hypertension: Secondary | ICD-10-CM

## 2021-02-05 DIAGNOSIS — E1169 Type 2 diabetes mellitus with other specified complication: Secondary | ICD-10-CM

## 2021-02-05 DIAGNOSIS — E871 Hypo-osmolality and hyponatremia: Secondary | ICD-10-CM

## 2021-02-05 LAB — BASIC METABOLIC PANEL
Anion gap: 11 (ref 5–15)
BUN: 10 mg/dL (ref 8–23)
CO2: 25 mmol/L (ref 22–32)
Calcium: 8.6 mg/dL — ABNORMAL LOW (ref 8.9–10.3)
Chloride: 97 mmol/L — ABNORMAL LOW (ref 98–111)
Creatinine, Ser: 0.77 mg/dL (ref 0.61–1.24)
GFR, Estimated: >60 mL/min (ref >60)
Glucose, Bld: 95 mg/dL (ref 70–99)
Potassium: 3.9 mmol/L (ref 3.5–5.1)
Sodium: 133 mmol/L — ABNORMAL LOW (ref 135–145)

## 2021-02-05 LAB — CBC
HCT: 37.4 % — ABNORMAL LOW (ref 39.0–52.0)
Hemoglobin: 13.4 g/dL (ref 13.0–17.0)
MCH: 32.4 pg (ref 26.0–34.0)
MCHC: 35.8 g/dL (ref 30.0–36.0)
MCV: 90.6 fL (ref 80.0–100.0)
Platelets: 203 10*3/uL (ref 150–400)
RBC: 4.13 MIL/uL — ABNORMAL LOW (ref 4.22–5.81)
RDW: 14.7 % (ref 11.5–15.5)
WBC: 9.2 10*3/uL (ref 4.0–10.5)
nRBC: 0 % (ref 0.0–0.2)

## 2021-02-05 LAB — GLUCOSE, CAPILLARY
Glucose-Capillary: 85 mg/dL (ref 70–99)
Glucose-Capillary: 136 mg/dL — ABNORMAL HIGH (ref 70–99)

## 2021-02-05 MED ORDER — ADULT MULTIVITAMIN W/MINERALS CH: 1.0000 | ORAL_TABLET | Freq: Every day | ORAL | Status: AC

## 2021-02-05 MED ORDER — IPRATROPIUM-ALBUTEROL 0.5-2.5 (3) MG/3ML IN SOLN: 3.0000 mL | Freq: Four times a day (QID) | RESPIRATORY_TRACT | Status: DC

## 2021-02-05 MED ORDER — FLUTICASONE PROPIONATE HFA 110 MCG/ACT IN AERO: 1.0000 | INHALATION_SPRAY | Freq: Two times a day (BID) | RESPIRATORY_TRACT | 12 refills | Status: AC

## 2021-02-05 MED ORDER — TAMSULOSIN HCL 0.4 MG PO CAPS: 0.4000 mg | ORAL_CAPSULE | Freq: Every day | ORAL | 0 refills | Status: AC

## 2021-02-05 MED ORDER — THIAMINE HCL 100 MG PO TABS: 100.0000 mg | ORAL_TABLET | Freq: Every day | ORAL | 0 refills | Status: AC

## 2021-02-05 MED ORDER — FOLIC ACID 1 MG PO TABS: 1.0000 mg | ORAL_TABLET | Freq: Every day | ORAL | 0 refills | Status: DC

## 2021-02-05 MED ORDER — LORAZEPAM 0.5 MG PO TABS: ORAL_TABLET | ORAL | 0 refills | Status: DC

## 2021-02-05 MED ORDER — IPRATROPIUM-ALBUTEROL 20-100 MCG/ACT IN AERS: 2.0000 | INHALATION_SPRAY | Freq: Four times a day (QID) | RESPIRATORY_TRACT | Status: DC

## 2021-02-05 NOTE — Evaluation (Signed)
Physical Therapy Evaluation Patient Details Name: Derrick Jones MRN: [id number] DOB: 20-Jan-1946 Today's Date: 02/05/2021   History of Present Illness  Derrick Jones is a 75 y.o. male with medical history significant of DM, HTN and binge alcohol use.  Patient says he has been on an alcohol binge for last 1 week-- 1/5th/day.    He has lost 23 pounds in the last week.  He called EMS today as he could not urinate  Clinical Impression  Patient received in bed, he is pleasant and agreeable. Patient's wife in room. He is mod independent with bed mobility, transfers with min guard. Ambulated with min guard initially, then progressed to supervision. Patient ambulates without AD. He is slightly unsteady, but no LOB. He will benefit from continued skilled PT while here to improve functional independence and safety with mobility.      Follow Up Recommendations No PT follow up    Equipment Recommendations  None recommended by PT    Recommendations for Other Services       Precautions / Restrictions Precautions Precautions: Fall Restrictions Weight Bearing Restrictions: No      Mobility  Bed Mobility Overal bed mobility: Modified Independent                  Transfers Overall transfer level: Needs assistance Equipment used: None Transfers: Sit to/from Stand Sit to Stand: Min guard         General transfer comment: required increased effort to stand from low bed.  Ambulation/Gait Ambulation/Gait assistance: Min guard;Supervision Gait Distance (Feet): 175 Feet Assistive device: None Gait Pattern/deviations: Step-through pattern;Narrow base of support;Drifts right/left Gait velocity: WFL   General Gait Details: patient is slightly unsteady initially with ambulation, appeared to improve with increased distance. Requiring min guard/supervision  Stairs            Wheelchair Mobility    Modified Rankin (Stroke Patients Only)       Balance Overall balance assessment:  Needs assistance Sitting-balance support: Feet supported Sitting balance-Leahy Scale: Good     Standing balance support: During functional activity;No upper extremity supported Standing balance-Leahy Scale: Good Standing balance comment: no overt lob, slight unsteadiness                             Pertinent Vitals/Pain Pain Assessment: No/denies pain    Home Living Family/patient expects to be discharged to:: Private residence Living Arrangements: Spouse/significant other Available Help at Discharge: Family;Available 24 hours/day Type of Home: House Home Access: Stairs to enter Entrance Stairs-Rails: Chemical engineer of Steps: a few Home Layout: One level Home Equipment: None      Prior Function Level of Independence: Independent               Hand Dominance        Extremity/Trunk Assessment   Upper Extremity Assessment Upper Extremity Assessment: Generalized weakness    Lower Extremity Assessment Lower Extremity Assessment: Generalized weakness    Cervical / Trunk Assessment Cervical / Trunk Assessment: Normal  Communication   Communication: No difficulties  Cognition Arousal/Alertness: Awake/alert Behavior During Therapy: WFL for tasks assessed/performed Overall Cognitive Status: Within Functional Limits for tasks assessed                                 General Comments: very pleasant and cooperative      General Comments  Exercises     Assessment/Plan    PT Assessment Patient needs continued PT services  PT Problem List Decreased strength;Decreased mobility;Decreased activity tolerance;Decreased balance       PT Treatment Interventions Gait training;Therapeutic exercise;Stair training;Functional mobility training;Therapeutic activities;Patient/family education;Balance training    PT Goals (Current goals can be found in the Care Plan section)  Acute Rehab PT Goals Patient Stated Goal: to  stop smoking and drinking, return home PT Goal Formulation: With patient Time For Goal Achievement: 02/13/21 Potential to Achieve Goals: Good    Frequency Min 2X/week   Barriers to discharge        Co-evaluation               AM-PAC PT "6 Clicks" Mobility  Outcome Measure Help needed turning from your back to your side while in a flat bed without using bedrails?: None Help needed moving from lying on your back to sitting on the side of a flat bed without using bedrails?: A Little Help needed moving to and from a bed to a chair (including a wheelchair)?: A Little Help needed standing up from a chair using your arms (e.g., wheelchair or bedside chair)?: A Little Help needed to walk in hospital room?: A Little Help needed climbing 3-5 steps with a railing? : A Little 6 Click Score: 19    End of Session   Activity Tolerance: Patient tolerated treatment well Patient left: in chair;with call bell/phone within reach;with family/visitor present Nurse Communication: Mobility status PT Visit Diagnosis: Unsteadiness on feet (R26.81);Muscle weakness (generalized) (M62.81)    Time: 1130-1150 PT Time Calculation (min) (ACUTE ONLY): 20 min   Charges:   PT Evaluation $PT Eval Moderate Complexity: 1 Mod PT Treatments $Gait Training: 8-22 mins        [employer], PT, GCS 02/05/21,12:00 PM

## 2021-02-05 NOTE — Discharge Summary (Signed)
[facility] [place] NAME: [name]    MR#:  [id number]  DATE OF BIRTH:  May 19, 1946  DATE OF ADMISSION:  02/03/2021 ADMITTING PHYSICIAN: Geradine Girt, DO  DATE OF DISCHARGE: 02/05/2021  1:04 PM  PRIMARY CARE PHYSICIAN: Cletis Athens, MD    ADMISSION DIAGNOSIS:  Alcohol withdrawal ([place]) [F10.239] Hyponatremia [E87.1] Alcohol withdrawal syndrome with complication ([place]) [R51.884]  DISCHARGE DIAGNOSIS:  Active Problems:   Hypertension   Alcohol abuse   Alcohol withdrawal ([place])   Suicidal ideation   Hyponatremia   SECONDARY DIAGNOSIS:   Past Medical History:  Diagnosis Date   Diabetes mellitus without complication (HCC)    GERD (gastroesophageal reflux disease)    Hypertension     HOSPITAL COURSE:   Alcohol abuse and alcohol withdrawal.  Continue thiamine folic acid and multivitamin as outpatient.  Patient's wife wanted some medication to go home with.  I prescribed a tapering dose of Ativan only 10 pills to go home with.  Must stop alcohol. Hyponatremia secondary to alcohol abuse.  Sodium improved from 126 up to 133. Urinary retention.  Bladder scan initially showed 1200 mL.  Foley catheter was inserted.  Started on Flomax.  Will need urology as outpatient for voiding trial.  Nursing staff to teach patient and wife how to empty Foley catheter. Type 2 diabetes mellitus with hyperlipidemia can go back on metformin as outpatient.  Hold metformin for now consider restarting as outpatient. Essential hypertension.  Continue atenolol Seen by psychiatry.  Did not meet inpatient criteria. Hold Lipitor for now.  DISCHARGE CONDITIONS:   Satisfactory  CONSULTS OBTAINED:   Psychiatry  DRUG ALLERGIES:  No Known Allergies  DISCHARGE MEDICATIONS:   Allergies as of 02/05/2021   No Known Allergies      Medication List     STOP taking these medications    atorvastatin 20 MG tablet Commonly known as: LIPITOR    diphenhydramine-acetaminophen 25-500 MG Tabs tablet Commonly known as: TYLENOL PM   enoxaparin 40 MG/0.4ML injection Commonly known as: LOVENOX   oxyCODONE 5 MG immediate release tablet Commonly known as: Oxy IR/ROXICODONE   tiZANidine 2 MG tablet Commonly known as: ZANAFLEX   traMADol 50 MG tablet Commonly known as: ULTRAM       TAKE these medications    albuterol 108 (90 Base) MCG/ACT inhaler Commonly known as: VENTOLIN HFA Inhale 2 puffs into the lungs every 4 (four) hours as needed.   atenolol 100 MG tablet Commonly known as: TENORMIN Take 1 tablet by mouth once daily   fluticasone 110 MCG/ACT inhaler Commonly known as: Flovent HFA Inhale 1 puff into the lungs 2 (two) times daily.   folic acid 1 MG tablet Commonly known as: FOLVITE Take 1 tablet (1 mg total) by mouth daily. Start taking on: February 06, 2021   LORazepam 0.5 MG tablet Commonly known as: Ativan One tab po three times a day day1; one tab po twice a day day2,3; one tab po daily day 4,5,6   metFORMIN 500 MG tablet Commonly known as: GLUCOPHAGE Take 1 tablet (500 mg total) by mouth daily.   multivitamin with minerals Tabs tablet Take 1 tablet by mouth daily. Start taking on: February 06, 2021   omeprazole 20 MG capsule Commonly known as: PRILOSEC Take 20 mg by mouth daily. What changed: Another medication with the same name was removed. Continue taking this medication, and follow the directions you see here.   tamsulosin 0.4 MG Caps capsule Commonly known  as: FLOMAX Take 1 capsule (0.4 mg total) by mouth daily after supper.   thiamine 100 MG tablet Take 1 tablet (100 mg total) by mouth daily. Start taking on: February 06, 2021         DISCHARGE INSTRUCTIONS:  Follow-up PMD 5 days Follow-up urology for voiding trial 1 week  If you experience worsening of your admission symptoms, develop shortness of breath, life threatening emergency, suicidal or homicidal thoughts you must seek medical  attention immediately by calling 911 or calling your MD immediately  if symptoms less severe.  You Must read complete instructions/literature along with all the possible adverse reactions/side effects for all the Medicines you take and that have been prescribed to you. Take any new Medicines after you have completely understood and accept all the possible adverse reactions/side effects.   Please note  You were cared for by a hospitalist during your hospital stay. If you have any questions about your discharge medications or the care you received while you were in the hospital after you are discharged, you can call the unit and asked to speak with the hospitalist on call if the hospitalist that took care of you is not available. Once you are discharged, your primary care physician will handle any further medical issues. Please note that NO REFILLS for any discharge medications will be authorized once you are discharged, as it is imperative that you return to your primary care physician (or establish a relationship with a primary care physician if you do not have one) for your aftercare needs so that they can reassess your need for medications and monitor your lab values.    Today   CHIEF COMPLAINT:   Chief Complaint  Patient presents with   Dysuria   Delirium Tremens (DTS)   Suicidal    HISTORY OF PRESENT ILLNESS:  Derrick Jones  is a 75 y.o. male came in with alcohol withdrawal and unable to urinate.   VITAL SIGNS:  Blood pressure 124/87, pulse 86, temperature 97.8 F (36.6 C), temperature source Oral, resp. rate 18, height 5\' 9"  (1.753 m), weight 60.5 kg, SpO2 98 %.  I/O:   Intake/Output Summary (Last 24 hours) at 02/05/2021 1619 Last data filed at 02/05/2021 1052 Gross per 24 hour  Intake 1305.51 ml  Output 550 ml  Net 755.51 ml    PHYSICAL EXAMINATION:  GENERAL:  75 y.o.-year-old patient lying in the bed with no acute distress.  EYES: Pupils equal, round, reactive to light and  accommodation. No scleral icterus. HEENT: Head atraumatic, normocephalic. Oropharynx and nasopharynx clear.  LUNGS: Normal breath sounds bilaterally, no wheezing, rales,rhonchi or crepitation. No use of accessory muscles of respiration.  CARDIOVASCULAR: S1, S2 normal. No murmurs, rubs, or gallops.  ABDOMEN: Soft, non-tender, non-distended. EXTREMITIES: No pedal edema.  NEUROLOGIC: Cranial nerves II through XII are intact. Muscle strength 5/5 in all extremities. Sensation intact. Gait not checked.  PSYCHIATRIC: The patient is alert and oriented x 3.  SKIN: No obvious rash, lesion, or ulcer.   DATA REVIEW:   CBC Recent Labs  Lab 02/05/21 0433  WBC 9.2  HGB 13.4  HCT 37.4*  PLT 203    Chemistries  Recent Labs  Lab 02/03/21 1542 02/04/21 0344 02/05/21 0433  NA 126* 129* 133*  K 4.6 4.1 3.9  CL 88* 95* 97*  CO2 27 25 25   GLUCOSE 109* 95 95  BUN 14 11 10   CREATININE 0.85 0.68 0.77  CALCIUM 9.4 8.7* 8.6*  MG 1.9  --   --  AST 63* 49*  --   ALT 33 27  --   ALKPHOS 60 57  --   BILITOT 2.5* 2.4*  --      Microbiology Results  Results for orders placed or performed during the hospital encounter of 02/03/21  Resp Panel by RT-PCR (Flu A&B, Covid) Nasopharyngeal Swab     Status: None   Collection Time: 02/03/21  5:18 PM   Specimen: Nasopharyngeal Swab; Nasopharyngeal(NP) swabs in vial transport medium  Result Value Ref Range Status   SARS Coronavirus 2 by RT PCR NEGATIVE NEGATIVE Final    Comment: (NOTE) SARS-CoV-2 target nucleic acids are NOT DETECTED.  The SARS-CoV-2 RNA is generally detectable in upper respiratory specimens during the acute phase of infection. The lowest concentration of SARS-CoV-2 viral copies this assay can detect is 138 copies/mL. A negative result does not preclude SARS-Cov-2 infection and should not be used as the sole basis for treatment or other patient management decisions. A negative result may occur with  improper specimen  collection/handling, submission of specimen other than nasopharyngeal swab, presence of viral mutation(s) within the areas targeted by this assay, and inadequate number of viral copies(<138 copies/mL). A negative result must be combined with clinical observations, patient history, and epidemiological information. The expected result is Negative.  Fact Sheet for Patients:  EntrepreneurPulse.com.au  Fact Sheet for Healthcare Providers:  IncredibleEmployment.be  This test is no t yet approved or cleared by the [place] FDA and  has been authorized for detection and/or diagnosis of SARS-CoV-2 by FDA under an Emergency Use Authorization (EUA). This EUA will remain  in effect (meaning this test can be used) for the duration of the COVID-19 declaration under Section 564(b)(1) of the Act, 21 U.S.C.section 360bbb-3(b)(1), unless the authorization is terminated  or revoked sooner.       Influenza A by PCR NEGATIVE NEGATIVE Final   Influenza B by PCR NEGATIVE NEGATIVE Final    Comment: (NOTE) The Xpert Xpress SARS-CoV-2/FLU/RSV plus assay is intended as an aid in the diagnosis of influenza from Nasopharyngeal swab specimens and should not be used as a sole basis for treatment. Nasal washings and aspirates are unacceptable for Xpert Xpress SARS-CoV-2/FLU/RSV testing.  Fact Sheet for Patients: EntrepreneurPulse.com.au  Fact Sheet for Healthcare Providers: IncredibleEmployment.be  This test is not yet approved or cleared by the [place] FDA and has been authorized for detection and/or diagnosis of SARS-CoV-2 by FDA under an Emergency Use Authorization (EUA). This EUA will remain in effect (meaning this test can be used) for the duration of the COVID-19 declaration under Section 564(b)(1) of the Act, 21 U.S.C. section 360bbb-3(b)(1), unless the authorization is terminated or revoked.  Performed at [facility], [address], [place], [place] [address]     Management plans discussed with the patient, family and they are in agreement.  CODE STATUS:     Code Status Orders  (From admission, onward)           Start     Ordered   02/03/21 1807  Full code  Continuous        02/03/21 1806           Code Status History     Date Active Date Inactive Code Status Order ID Comments User Context   05/02/2020 0907 05/07/2020 1927 Full Code [id number]  Collier Bullock, MD ED   05/09/2019 0622 05/10/2019 1924 Full Code [id number]  Harrie Foreman, MD ED   05/09/2019 0139 05/09/2019 0622 Full Code  [id number]  Gregor Hams, MD ED       TOTAL TIME TAKING CARE OF THIS PATIENT: 35 minutes.    Loletha Grayer M.D on 02/05/2021 at 4:19 PM  [facility]  CC: Primary care physician; Cletis Athens, MD

## 2021-02-05 NOTE — TOC Progression Note (Signed)
Transition of Care [facility]) - Progression Note    Patient Details  Name: Derrick Jones MRN: [id number] Date of Birth: 01/11/1946  Transition of Care [facility]) CM/SW [place], RN Phone Number: 02/05/2021, 2:15 PM  Clinical Narrative:   TOC spoke with patient, who was tearful.  He stated that he was sober after a short rehab stay and then started to drink again with friends.  Therapeutic listening offered by [facility].  Patient stated that he appreciated the interaction and felt confident about finding help.  Substance abuse resources in the community given to patient, who states he is confident he will find an AA meeting when he gets home to [place].  Patient's wife lives with him and states she can assist him as needed.  Neither patient nor spouse have concerns about patient being discharged home.         Expected Discharge Plan and Services           Expected Discharge Date: 02/05/21                                     Social Determinants of Health (SDOH) Interventions    Readmission Risk Interventions No flowsheet data found.

## 2021-02-12 ENCOUNTER — Encounter: Payer: Self-pay | Admitting: Urology

## 2021-02-12 ENCOUNTER — Other Ambulatory Visit: Payer: Self-pay

## 2021-02-12 ENCOUNTER — Ambulatory Visit: Payer: PPO | Admitting: Urology

## 2021-02-12 ENCOUNTER — Ambulatory Visit: Payer: Self-pay | Admitting: Urology

## 2021-02-12 VITALS — BP 159/97 | HR 84 | Ht 69.0 in | Wt 125.0 lb

## 2021-02-12 DIAGNOSIS — R338 Other retention of urine: Secondary | ICD-10-CM

## 2021-02-12 LAB — BLADDER SCAN AMB NON-IMAGING: Scan Result: 304

## 2021-02-12 NOTE — Progress Notes (Signed)
02/12/2021 2:56 PM   Derrick Jones 12/01/1945 [id number]  Referring provider: Cletis Athens, MD [address] [place],  [place] [address]  Urological history: 1. Urinary retention -previous episode 12/2019 secondary to EtOH and Xanax abuse  Chief Complaint  Patient presents with   Other    HPI: Derrick Jones is a 75 y.o. male who presents today for a hospital follow up.  He was admitted after calling EMS and urinary retention.  Unfortunately he had been on an alcohol binge for the last week drinking about 1/5 of alcohol daily.  Bladder scan was performed and noted 1200 cc in the bladder, so Foley catheter was inserted.  He was started on tamsulosin 0.4 mg daily.  Catheter Removal  Patient is present today for a catheter removal.  9 ml of water was drained from the balloon. A 16 FR foley cath was removed from the bladder no complications were noted . Patient tolerated well.  Performed by: Myself   He states he has been pushing fluids and he has had good voids during the day.  When he presented to the office this afternoon he stated he had to urinate.  His PVR was > 404 mL.  He urinated into the urinal in the exam room 300 cc.  Repeat PVR 304 cc.    PMH: Past Medical History:  Diagnosis Date   Diabetes mellitus without complication ([place])    GERD (gastroesophageal reflux disease)    Hypertension     Surgical History: Past Surgical History:  Procedure Laterality Date   COLONOSCOPY WITH PROPOFOL N/A 07/21/2019   Procedure: COLONOSCOPY WITH PROPOFOL;  Surgeon: Jonathon Bellows, MD;  Location[facility] END[facility]COPY;  Service: Gastroenterology;  Laterality: N/A;   ESOPHAGOGASTRODUODENOSCOPY (EGD) WITH PROPOFOL N/A 03/11/2020   Procedure: ESOPHAGOGASTRODUODENOSCOPY (EGD) WITH PROPOFOL;  Surgeon: Jonathon Bellows, MD;  Location: [facility] ENDOSCOPY;  Service: Gastroenterology;  Laterality: N/A;   ESOPHAGOGASTRODUODENOSCOPY (EGD) WITH PROPOFOL N/A 04/11/2020   Procedure: ESOPHAGOGASTRODUODENOSCOPY (EGD) WITH  PROPOFOL;  Surgeon: Jonathon Bellows, MD;  Location[facility] ENDOSCOPY;  Service: Gastroenterology;  Laterality: N/A;   ESOPHAGOGASTRODUODENOSCOPY (EGD) WITH PROPOFOL N/A 07/29/2020   Procedure: ESOPHAGOGASTRODUODENOSCOPY (EGD) WITH PROPOFOL;  Surgeon: Jonathon Bellows, MD;  Location[facility] ENDOSCOPY;  Service: Gastroenterology;  Laterality: N/A;   EYE SURGERY     FEMUR FRACTURE SURGERY Left    FRACTURE SURGERY     INTRAMEDULLARY (IM) NAIL INTERTROCHANTERIC Left 05/03/2020   Procedure: INTRAMEDULLARY (IM) NAIL INTERTROCHANTRIC;  Surgeon: Leim Fabry, MD;  Location: [facility] ORS;  Service: Orthopedics;  Laterality: Left;    Home Medications:  Allergies as of 02/12/2021   No Known Allergies      Medication List        Accurate as of February 12, 2021  2:56 PM. If you have any questions, ask your nurse or doctor.          albuterol 108 (90 Base) MCG/ACT inhaler Commonly known as: VENTOLIN HFA Inhale 2 puffs into the lungs every 4 (four) hours as needed.   atenolol 100 MG tablet Commonly known as: TENORMIN Take 1 tablet by mouth once daily   fluticasone 110 MCG/ACT inhaler Commonly known as: Flovent HFA Inhale 1 puff into the lungs 2 (two) times daily.   folic acid 1 MG tablet Commonly known as: FOLVITE Take 1 tablet (1 mg total) by mouth daily.   LORazepam 0.5 MG tablet Commonly known as: Ativan One tab po three times a day day1; one tab po twice a day day2,3; one tab  po daily day 4,5,6   metFORMIN 500 MG tablet Commonly known as: GLUCOPHAGE Take 1 tablet (500 mg total) by mouth daily.   multivitamin with minerals Tabs tablet Take 1 tablet by mouth daily.   omeprazole 20 MG capsule Commonly known as: PRILOSEC Take 20 mg by mouth daily.   tamsulosin 0.4 MG Caps capsule Commonly known as: FLOMAX Take 1 capsule (0.4 mg total) by mouth daily after supper.   thiamine 100 MG tablet Take 1 tablet (100 mg total) by mouth daily.        Allergies: No Known Allergies  Family  History: Family History  Problem Relation Age of Onset   Breast cancer Sister    Ovarian cancer Sister     Social History:  reports that he has been smoking cigarettes. He has a 53.00 pack-year smoking history. He has never used smokeless tobacco. He reports current alcohol use. He reports that he does not use drugs.  ROS: Pertinent ROS in HPI  Physical Exam: BP (!) 159/97   Pulse 84   Ht 5\' 9"  (1.753 m)   Wt 125 lb (56.7 kg)   BMI 18.46 kg/m   Constitutional:  Well nourished. Alert and oriented, No acute distress. HEENT: [place] AT, mask in place.  Trachea midline Cardiovascular: No clubbing, cyanosis, or edema. Respiratory: Normal respiratory effort, no increased work of breathing. GU: No CVA tenderness.  No bladder fullness or masses.  Patient with uncircumcised phallus. Foley in place.  Neurologic: Grossly intact, no focal deficits, moving all 4 extremities. Psychiatric: Normal mood and affect.  Laboratory Data: Lab Results  Component Value Date   WBC 9.2 02/05/2021   HGB 13.4 02/05/2021   HCT 37.4 (L) 02/05/2021   MCV 90.6 02/05/2021   PLT 203 02/05/2021    Lab Results  Component Value Date   CREATININE 0.77 02/05/2021   Lab Results  Component Value Date   HGBA1C 6.4 (H) 05/02/2020       Component Value Date/Time   CHOL 227 (H) 07/02/2017 1406   HDL 45 07/02/2017 1406   CHOLHDL 5.0 (H) 07/02/2017 1406   LDLCALC 145 (H) 07/02/2017 1406    Lab Results  Component Value Date   AST 49 (H) 02/04/2021   Lab Results  Component Value Date   ALT 27 02/04/2021    Urinalysis    Component Value Date/Time   COLORURINE YELLOW (A) 02/03/2021 1123   APPEARANCEUR HAZY (A) 02/03/2021 1123   APPEARANCEUR Clear 02/08/2013 1549   LABSPEC 1.010 02/03/2021 1123   LABSPEC 1.015 02/08/2013 1549   PHURINE 5.0 02/03/2021 1123   GLUCOSEU 50 (A) 02/03/2021 1123   GLUCOSEU Negative 02/08/2013 1549   HGBUR NEGATIVE 02/03/2021 1123   BILIRUBINUR NEGATIVE 02/03/2021 1123    BILIRUBINUR Negative 02/08/2013 1549   KETONESUR NEGATIVE 02/03/2021 1123   PROTEINUR 30 (A) 02/03/2021 1123   NITRITE NEGATIVE 02/03/2021 1123   LEUKOCYTESUR NEGATIVE 02/03/2021 1123   LEUKOCYTESUR Negative 02/08/2013 1549  I have reviewed the labs.   Pertinent Imaging: Results for TADEO, BESECKER (MRN [id number]) as of 02/12/2021 14:56  Ref. Range 02/12/2021 13:17  Scan Result Unknown 304 ml   Assessment & Plan:    1. Acute urinary retention:    - foley catheter removed -voiding trial today   -Discussed that the PVR was still at a moderate level and he is at risk of going into urinary retention again.  I offered replacing the Foley catheter or instructing him in self-catheterization techniques, but he stated he  will be all right and did not want either option -Continue tamsulosin 0.4 mg daily -return if unable to urinate or experiencing suprapubic discomfort  Return in about 1 month (around 03/14/2021) for IPSS and PVR.  These notes generated with voice recognition software. I apologize for typographical errors.  Zara Council, PA-C  [facility] [address]  [place], [place] [address] [phone]   I spent 30 minutes on the day of the encounter to include pre-visit record review, face-to-face time with the patient, and post-visit ordering of tests.

## 2021-03-04 ENCOUNTER — Other Ambulatory Visit: Payer: Self-pay | Admitting: *Deleted

## 2021-03-04 MED ORDER — ATENOLOL 100 MG PO TABS: 100.0000 mg | ORAL_TABLET | Freq: Every day | ORAL | 3 refills | Status: DC

## 2021-03-04 MED ORDER — OMEPRAZOLE 20 MG PO CPDR: 20.0000 mg | DELAYED_RELEASE_CAPSULE | Freq: Every day | ORAL | 3 refills | Status: AC

## 2021-03-13 NOTE — Progress Notes (Deleted)
03/14/2021 11:44 AM   Derrick Jones Dec 28, 1945 [id number]  Referring provider: Cletis Athens, MD [address] [place],  [place] [address]  Urological history: 1. Urinary retention -previous episode 12/2019 secondary to EtOH and Xanax abuse  No chief complaint on file.   HPI: Derrick Jones is a 75 y.o. male who presents today for a hospital follow up.  He was admitted after calling EMS and urinary retention.  Unfortunately he had been on an alcohol binge for the last week drinking about 1/5 of alcohol daily.  Bladder scan was performed and noted 1200 cc in the bladder, so Foley catheter was inserted.  He was started on tamsulosin 0.4 mg daily.  Catheter Removal  Patient is present today for a catheter removal.  9 ml of water was drained from the balloon. A 16 FR foley cath was removed from the bladder no complications were noted . Patient tolerated well.  Performed by: Myself   He states he has been pushing fluids and he has had good voids during the day.  When he presented to the office this afternoon he stated he had to urinate.  His PVR was > 404 mL.  He urinated into the urinal in the exam room 300 cc.  Repeat PVR 304 cc.    PMH: Past Medical History:  Diagnosis Date   Diabetes mellitus without complication ([place])    GERD (gastroesophageal reflux disease)    Hypertension     Surgical History: Past Surgical History:  Procedure Laterality Date   COLONOSCOPY WITH PROPOFOL N/A 07/21/2019   Procedure: COLONOSCOPY WITH PROPOFOL;  Surgeon: Jonathon Bellows, MD;  Location: [facility] ENDOSCOPY;  Service: Gastroenterology;  Laterality: N/A;   ESOPHAGOGASTRODUODENOSCOPY (EGD) WITH PROPOFOL N/A 03/11/2020   Procedure: ESOPHAGOGASTRODUODENOSCOPY (EGD) WITH PROPOFOL;  Surgeon: Jonathon Bellows, MD;  Location: [facility] ENDOSCOPY;  Service: Gastroenterology;  Laterality: N/A;   ESOPHAGOGASTRODUODENOSCOPY (EGD) WITH PROPOFOL N/A 04/11/2020   Procedure: ESOPHAGOGASTRODUODENOSCOPY (EGD) WITH PROPOFOL;  Surgeon:  Jonathon Bellows, MD;  Location: [facility] ENDOSCOPY;  Service: Gastroenterology;  Laterality: N/A;   ESOPHAGOGASTRODUODENOSCOPY (EGD) WITH PROPOFOL N/A 07/29/2020   Procedure: ESOPHAGOGASTRODUODENOSCOPY (EGD) WITH PROPOFOL;  Surgeon: Jonathon Bellows, MD;  Location: [facility] ENDOSCOPY;  Service: Gastroenterology;  Laterality: N/A;   EYE SURGERY     FEMUR FRACTURE SURGERY Left    FRACTURE SURGERY     INTRAMEDULLARY (IM) NAIL INTERTROCHANTERIC Left 05/03/2020   Procedure: INTRAMEDULLARY (IM) NAIL INTERTROCHANTRIC;  Surgeon: Leim Fabry, MD;  Location: [facility] ORS;  Service: Orthopedics;  Laterality: Left;    Home Medications:  Allergies as of 03/14/2021   No Known Allergies      Medication List        Accurate as of March 13, 2021 11:44 AM. If you have any questions, ask your nurse or doctor.          albuterol 108 (90 Base) MCG/ACT inhaler Commonly known as: VENTOLIN HFA Inhale 2 puffs into the lungs every 4 (four) hours as needed.   atenolol 100 MG tablet Commonly known as: TENORMIN Take 1 tablet (100 mg total) by mouth daily.   fluticasone 110 MCG/ACT inhaler Commonly known as: Flovent HFA Inhale 1 puff into the lungs 2 (two) times daily.   folic acid 1 MG tablet Commonly known as: FOLVITE Take 1 tablet (1 mg total) by mouth daily.   LORazepam 0.5 MG tablet Commonly known as: Ativan One tab po three times a day day1; one tab po twice a day day2,3; one tab po daily day 4,5,6  metFORMIN 500 MG tablet Commonly known as: GLUCOPHAGE Take 1 tablet (500 mg total) by mouth daily.   multivitamin with minerals Tabs tablet Take 1 tablet by mouth daily.   omeprazole 20 MG capsule Commonly known as: PRILOSEC Take 1 capsule (20 mg total) by mouth daily.   tamsulosin 0.4 MG Caps capsule Commonly known as: FLOMAX Take 1 capsule (0.4 mg total) by mouth daily after supper.   thiamine 100 MG tablet Take 1 tablet (100 mg total) by mouth daily.        Allergies: No Known Allergies  Family  History: Family History  Problem Relation Age of Onset   Breast cancer Sister    Ovarian cancer Sister     Social History:  reports that he has been smoking cigarettes. He has a 53.00 pack-year smoking history. He has never used smokeless tobacco. He reports current alcohol use. He reports that he does not use drugs.  ROS: Pertinent ROS in HPI  Physical Exam: There were no vitals taken for this visit.  Constitutional:  Well nourished. Alert and oriented, No acute distress. HEENT: [place] AT, mask in place.  Trachea midline Cardiovascular: No clubbing, cyanosis, or edema. Respiratory: Normal respiratory effort, no increased work of breathing. GU: No CVA tenderness.  No bladder fullness or masses.  Patient with uncircumcised phallus. Foley in place.  Neurologic: Grossly intact, no focal deficits, moving all 4 extremities. Psychiatric: Normal mood and affect.  Laboratory Data: Lab Results  Component Value Date   WBC 9.2 02/05/2021   HGB 13.4 02/05/2021   HCT 37.4 (L) 02/05/2021   MCV 90.6 02/05/2021   PLT 203 02/05/2021    Lab Results  Component Value Date   CREATININE 0.77 02/05/2021   Lab Results  Component Value Date   HGBA1C 6.4 (H) 05/02/2020       Component Value Date/Time   CHOL 227 (H) 07/02/2017 1406   HDL 45 07/02/2017 1406   CHOLHDL 5.0 (H) 07/02/2017 1406   LDLCALC 145 (H) 07/02/2017 1406    Lab Results  Component Value Date   AST 49 (H) 02/04/2021   Lab Results  Component Value Date   ALT 27 02/04/2021    Urinalysis    Component Value Date/Time   COLORURINE YELLOW (A) 02/03/2021 1123   APPEARANCEUR HAZY (A) 02/03/2021 1123   APPEARANCEUR Clear 02/08/2013 1549   LABSPEC 1.010 02/03/2021 1123   LABSPEC 1.015 02/08/2013 1549   PHURINE 5.0 02/03/2021 1123   GLUCOSEU 50 (A) 02/03/2021 1123   GLUCOSEU Negative 02/08/2013 1549   HGBUR NEGATIVE 02/03/2021 1123   BILIRUBINUR NEGATIVE 02/03/2021 1123   BILIRUBINUR Negative 02/08/2013 1549   KETONESUR  NEGATIVE 02/03/2021 1123   PROTEINUR 30 (A) 02/03/2021 1123   NITRITE NEGATIVE 02/03/2021 1123   LEUKOCYTESUR NEGATIVE 02/03/2021 1123   LEUKOCYTESUR Negative 02/08/2013 1549  I have reviewed the labs.   Pertinent Imaging: Results for MANCIL, NOIA (MRN [id number]) as of 02/12/2021 14:56  Ref. Range 02/12/2021 13:17  Scan Result Unknown 304 ml   Assessment & Plan:    1. Acute urinary retention:    - foley catheter removed -voiding trial today   -Discussed that the PVR was still at a moderate level and he is at risk of going into urinary retention again.  I offered replacing the Foley catheter or instructing him in self-catheterization techniques, but he stated he will be all right and did not want either option -Continue tamsulosin 0.4 mg daily -return if unable to urinate or experiencing  suprapubic discomfort  No follow-ups on file.  These notes generated with voice recognition software. I apologize for typographical errors.  Zara Council, PA-C  [facility] [address]  [place], [place] [address] [phone]

## 2021-03-14 ENCOUNTER — Encounter: Payer: Self-pay | Admitting: Urology

## 2021-03-14 ENCOUNTER — Ambulatory Visit: Payer: Self-pay | Admitting: Urology

## 2021-03-14 DIAGNOSIS — N401 Enlarged prostate with lower urinary tract symptoms: Secondary | ICD-10-CM

## 2021-03-14 DIAGNOSIS — R338 Other retention of urine: Secondary | ICD-10-CM

## 2021-05-06 ENCOUNTER — Other Ambulatory Visit: Payer: Self-pay

## 2021-05-06 ENCOUNTER — Encounter: Payer: Self-pay | Admitting: Internal Medicine

## 2021-05-06 ENCOUNTER — Ambulatory Visit (INDEPENDENT_AMBULATORY_CARE_PROVIDER_SITE_OTHER): Payer: PPO | Admitting: Internal Medicine

## 2021-05-06 VITALS — BP 128/82 | HR 66 | Ht 69.0 in | Wt 119.5 lb

## 2021-05-06 DIAGNOSIS — E785 Hyperlipidemia, unspecified: Secondary | ICD-10-CM | POA: Diagnosis not present

## 2021-05-06 DIAGNOSIS — F10930 Alcohol use, unspecified with withdrawal, uncomplicated: Secondary | ICD-10-CM

## 2021-05-06 DIAGNOSIS — R45851 Suicidal ideations: Secondary | ICD-10-CM

## 2021-05-06 DIAGNOSIS — I10 Essential (primary) hypertension: Secondary | ICD-10-CM

## 2021-05-06 DIAGNOSIS — I8290 Acute embolism and thrombosis of unspecified vein: Secondary | ICD-10-CM | POA: Insufficient documentation

## 2021-05-06 DIAGNOSIS — E1169 Type 2 diabetes mellitus with other specified complication: Secondary | ICD-10-CM | POA: Diagnosis not present

## 2021-05-06 DIAGNOSIS — F1011 Alcohol abuse, in remission: Secondary | ICD-10-CM

## 2021-05-06 DIAGNOSIS — F419 Anxiety disorder, unspecified: Secondary | ICD-10-CM | POA: Diagnosis not present

## 2021-05-06 DIAGNOSIS — S72145A Nondisplaced intertrochanteric fracture of left femur, initial encounter for closed fracture: Secondary | ICD-10-CM

## 2021-05-06 DIAGNOSIS — S060X1S Concussion with loss of consciousness of 30 minutes or less, sequela: Secondary | ICD-10-CM

## 2021-05-06 DIAGNOSIS — F10230 Alcohol dependence with withdrawal, uncomplicated: Secondary | ICD-10-CM | POA: Diagnosis not present

## 2021-05-06 MED ORDER — GLUCOSE BLOOD VI STRP: ORAL_STRIP | 12 refills | Status: AC

## 2021-05-06 NOTE — Assessment & Plan Note (Signed)
Stable at the present time.  He only gets anxious in the morning

## 2021-05-06 NOTE — Assessment & Plan Note (Signed)
Patient has a venous thrombosis of the vein in the left forearm there is no evidence of infection I told him to apply heat on it.  We will follow it up in 2 to 3 months.

## 2021-05-06 NOTE — Assessment & Plan Note (Signed)
He denies any history of headache dizziness or passing out

## 2021-05-06 NOTE — Assessment & Plan Note (Signed)
Patient does not drink any alcohol anymore

## 2021-05-06 NOTE — Assessment & Plan Note (Signed)
Left hip is doing well.  Patient can walk without any problem.

## 2021-05-06 NOTE — Assessment & Plan Note (Signed)

## 2021-05-06 NOTE — Assessment & Plan Note (Signed)
No suicidal ideas

## 2021-05-06 NOTE — Progress Notes (Signed)
Established Patient Office Visit  Subjective:  Patient ID: Derrick Jones, male    DOB: [birth year]  Age: 75 y.o. MRN: [id number]  CC:  Chief Complaint  Patient presents with   Follow-up    Patient is concerned about vein in left arm. Patient noticed prominent vein in lower arm, the previous arm.      HPI  Derrick Jones presents for check up/ lt arm vein is swollen and clotted , no h/o injury blood sugar is okay  Past Medical History:  Diagnosis Date   Diabetes mellitus without complication ([place])    GERD (gastroesophageal reflux disease)    Hypertension     Past Surgical History:  Procedure Laterality Date   COLONOSCOPY WITH PROPOFOL N/A 07/21/2019   Procedure: COLONOSCOPY WITH PROPOFOL;  Surgeon: Jonathon Bellows, MD;  Location[facility] ENDOSCOPY;  Service: Gastroenterology;  Laterality: N/A;   ESOPHAGOGASTRODUODENOSCOPY (EGD) WITH PROPOFOL N/A 03/11/2020   Procedure: ESOPHAGOGASTRODUODENOSCOPY (EGD) WITH PROPOFOL;  Surgeon: Jonathon Bellows, MD;  Location: [facility] ENDOSCOPY;  Service: Gastroenterology;  Laterality: N/A;   ESOPHAGOGASTRODUODENOSCOPY (EGD) WITH PROPOFOL N/A 04/11/2020   Procedure: ESOPHAGOGASTRODUODENOSCOPY (EGD) WITH PROPOFOL;  Surgeon: Jonathon Bellows, MD;  Location: [facility] ENDOSCOPY;  Service: Gastroenterology;  Laterality: N/A;   ESOPHAGOGASTRODUODENOSCOPY (EGD) WITH PROPOFOL N/A 07/29/2020   Procedure: ESOPHAGOGASTRODUODENOSCOPY (EGD) WITH PROPOFOL;  Surgeon: Jonathon Bellows, MD;  Location: [facility] ENDOSCOPY;  Service: Gastroenterology;  Laterality: N/A;   EYE SURGERY     FEMUR FRACTURE SURGERY Left    FRACTURE SURGERY     INTRAMEDULLARY (IM) NAIL INTERTROCHANTERIC Left 05/03/2020   Procedure: INTRAMEDULLARY (IM) NAIL INTERTROCHANTRIC;  Surgeon: Leim Fabry, MD;  Location: [facility] ORS;  Service: Orthopedics;  Laterality: Left;    Family History  Problem Relation Age of Onset   Breast cancer Sister    Ovarian cancer Sister     Social History   Socioeconomic History   Marital status:  Married    Spouse name: Not on file   Number of children: Not on file   Years of education: Not on file   Highest education level: Not on file  Occupational History   Not on file  Tobacco Use   Smoking status: Every Day    Packs/day: 1.00    Years: 53.00    Pack years: 53.00    Types: Cigarettes   Smokeless tobacco: Never  Vaping Use   Vaping Use: Never used  Substance and Sexual Activity   Alcohol use: Yes   Drug use: No   Sexual activity: Not on file  Other Topics Concern   Not on file  Social History Narrative   Not on file   Social Determinants of Health   Financial Resource Strain: Not on file  Food Insecurity: Not on file  Transportation Needs: Not on file  Physical Activity: Not on file  Stress: Not on file  Social Connections: Not on file  Intimate Partner Violence: Not on file     Current Outpatient Medications:    albuterol (VENTOLIN HFA) 108 (90 Base) MCG/ACT inhaler, Inhale 2 puffs into the lungs every 4 (four) hours as needed., Disp: , Rfl:    atenolol (TENORMIN) 100 MG tablet, Take 1 tablet (100 mg total) by mouth daily., Disp: 90 tablet, Rfl: 3   fluticasone (FLOVENT HFA) 110 MCG/ACT inhaler, Inhale 1 puff into the lungs 2 (two) times daily., Disp: 1 each, Rfl: 12   folic acid (FOLVITE) 1 MG tablet, Take 1 tablet (1 mg total) by mouth daily., Disp: 30 tablet,  Rfl: 0   LORazepam (ATIVAN) 0.5 MG tablet, One tab po three times a day day1; one tab po twice a day day2,3; one tab po daily day 4,5,6, Disp: 10 tablet, Rfl: 0   metFORMIN (GLUCOPHAGE) 500 MG tablet, Take 1 tablet (500 mg total) by mouth daily., Disp: 90 tablet, Rfl: 3   Multiple Vitamin (MULTIVITAMIN WITH MINERALS) TABS tablet, Take 1 tablet by mouth daily., Disp: , Rfl:    omeprazole (PRILOSEC) 20 MG capsule, Take 1 capsule (20 mg total) by mouth daily., Disp: 90 capsule, Rfl: 3   tamsulosin (FLOMAX) 0.4 MG CAPS capsule, Take 1 capsule (0.4 mg total) by mouth daily after supper., Disp: 30 capsule,  Rfl: 0   thiamine 100 MG tablet, Take 1 tablet (100 mg total) by mouth daily., Disp: 30 tablet, Rfl: 0   No Known Allergies  ROS Review of Systems  Constitutional: Negative.   HENT: Negative.    Eyes: Negative.   Respiratory: Negative.    Cardiovascular: Negative.   Gastrointestinal: Negative.   Endocrine: Negative.   Genitourinary: Negative.   Musculoskeletal: Negative.   Skin: Negative.   Allergic/Immunologic: Negative.   Neurological: Negative.   Hematological: Negative.   Psychiatric/Behavioral: Negative.    All other systems reviewed and are negative.    Objective:    Physical Exam Vitals reviewed.  Constitutional:      Appearance: Normal appearance.  HENT:     Mouth/Throat:     Mouth: Mucous membranes are moist.  Eyes:     Pupils: Pupils are equal, round, and reactive to light.  Neck:     Vascular: No carotid bruit.  Cardiovascular:     Rate and Rhythm: Normal rate and regular rhythm.     Pulses: Normal pulses.     Heart sounds: Normal heart sounds.  Pulmonary:     Effort: Pulmonary effort is normal.     Breath sounds: Normal breath sounds.  Abdominal:     General: Bowel sounds are normal.     Palpations: Abdomen is soft. There is no hepatomegaly, splenomegaly or mass.     Tenderness: There is no abdominal tenderness.     Hernia: No hernia is present.  Musculoskeletal:     Cervical back: Neck supple.     Right lower leg: No edema.     Left lower leg: No edema.  Skin:    Findings: No rash.  Neurological:     Mental Status: He is alert and oriented to person, place, and time.     Motor: No weakness.  Psychiatric:        Mood and Affect: Mood normal.        Behavior: Behavior normal.    BP 128/82   Pulse 66   Ht 5\' 9"  (1.753 m)   Wt 119 lb 8 oz (54.2 kg)   BMI 17.65 kg/m  Wt Readings from Last 3 Encounters:  05/06/21 119 lb 8 oz (54.2 kg)  02/12/21 125 lb (56.7 kg)  02/03/21 133 lb 6.1 oz (60.5 kg)     Health Maintenance Due  Topic Date  Due   COVID-19 Vaccine (1) Never done   FOOT EXAM  Never done   OPHTHALMOLOGY EXAM  Never done   URINE MICROALBUMIN  Never done   TETANUS/TDAP  Never done   Zoster Vaccines- Shingrix (1 of 2) Never done   HEMOGLOBIN A1C  10/30/2020   INFLUENZA VACCINE  03/17/2021    There are no preventive care reminders to display for this  patient.  No results found for: TSH Lab Results  Component Value Date   WBC 9.2 02/05/2021   HGB 13.4 02/05/2021   HCT 37.4 (L) 02/05/2021   MCV 90.6 02/05/2021   PLT 203 02/05/2021   Lab Results  Component Value Date   NA 133 (L) 02/05/2021   K 3.9 02/05/2021   CO2 25 02/05/2021   GLUCOSE 95 02/05/2021   BUN 10 02/05/2021   CREATININE 0.77 02/05/2021   BILITOT 2.4 (H) 02/04/2021   ALKPHOS 57 02/04/2021   AST 49 (H) 02/04/2021   ALT 27 02/04/2021   PROT 7.0 02/04/2021   ALBUMIN 4.0 02/04/2021   CALCIUM 8.6 (L) 02/05/2021   ANIONGAP 11 02/05/2021   Lab Results  Component Value Date   CHOL 227 (H) 07/02/2017   Lab Results  Component Value Date   HDL 45 07/02/2017   Lab Results  Component Value Date   LDLCALC 145 (H) 07/02/2017   Lab Results  Component Value Date   TRIG 229 (H) 07/02/2017   Lab Results  Component Value Date   CHOLHDL 5.0 (H) 07/02/2017   Lab Results  Component Value Date   HGBA1C 6.4 (H) 05/02/2020      Assessment & Plan:   Problem List Items Addressed This Visit       Cardiovascular and Mediastinum   Essential hypertension - Primary     Patient denies any chest pain or shortness of breath there is no history of palpitation or paroxysmal nocturnal dyspnea   patient was advised to follow low-salt low-cholesterol diet    ideally I want to keep systolic blood pressure below 130 mmHg, patient was asked to check blood pressure one times a week and give me a report on that.  Patient will be follow-up in 3 months  or earlier as needed, patient will call me back for any change in the cardiovascular symptoms Patient  was advised to buy a book from local bookstore concerning blood pressure and read several chapters  every day.  This will be supplemented by some of the material we will give him from the office.  Patient should also utilize other resources like YouTube and Internet to learn more about the blood pressure and the diet.      Venous thrombosis    Patient has a venous thrombosis of the vein in the left forearm there is no evidence of infection I told him to apply heat on it.  We will follow it up in 2 to 3 months.        Endocrine   Type 2 diabetes mellitus with hyperlipidemia ([place])    - The patient's blood sugar is labile on med. - The patient will continue the current treatment regimen.  - I encouraged the patient to regularly check blood sugar.  - I encouraged the patient to monitor diet. I encouraged the patient to eat low-carb and low-sugar to help prevent blood sugar spikes.  - I encouraged the patient to continue following their prescribed treatment plan for diabetes - I informed the patient to get help if blood sugar drops below 54mg /dL, or if suddenly have trouble thinking clearly or breathing.  Patient was advised to buy a book on diabetes from a local bookstore or from [place]  Patient should read 2 chapters every day to keep the motivation going, this is in addition to some of the materials we provided them from the office.  There are other resources on the Internet like YouTube and [employer]kipedia to  get an education on the diabetes        Nervous and Auditory   Concussion wth loss of consciousness of 30 minutes or less    He denies any history of headache dizziness or passing out      Alcohol withdrawal ([place])    Patient does not drink alcohol anymore        Musculoskeletal and Integument   Intertrochanteric fracture of left femur ([facility])    Left hip is doing well.  Patient can walk without any problem.        Other   Anxiety    Stable at the present time.  He only gets  anxious in the morning      History of alcohol abuse    Patient does not drink any alcohol anymore      Suicidal ideation    No suicidal ideas       No orders of the defined types were placed in this encounter.   Follow-up: No follow-ups on file.    Cletis Athens, MD

## 2021-05-06 NOTE — Assessment & Plan Note (Signed)
Patient does not drink alcohol anymore 

## 2021-05-06 NOTE — Assessment & Plan Note (Signed)

## 2021-05-08 ENCOUNTER — Other Ambulatory Visit: Payer: Self-pay | Admitting: *Deleted

## 2021-05-08 ENCOUNTER — Ambulatory Visit (INDEPENDENT_AMBULATORY_CARE_PROVIDER_SITE_OTHER): Payer: PPO | Admitting: *Deleted

## 2021-05-08 DIAGNOSIS — Z0000 Encounter for general adult medical examination without abnormal findings: Secondary | ICD-10-CM

## 2021-05-08 MED ORDER — ONETOUCH VERIO VI STRP: ORAL_STRIP | 12 refills | Status: AC

## 2021-05-08 NOTE — Progress Notes (Signed)
Subjective:   Derrick Jones is a 75 y.o. male who presents for an Initial Medicare Annual Wellness Visit.  I discussed the limitations of evaluation and management by telemedicine and the availability of in person apts. Patient expressed understanding and agreed to proceed.   Visit performed using audio  Patient:home Provider:home      Review of Systems     Defer to provider    Cardiac Risk Factors include: diabetes mellitus;advanced age (>29men, >26 women);male gender;smoking/ tobacco exposure     Objective:    There were no vitals filed for this visit. There is no height or weight on file to calculate BMI.  Advanced Directives 05/08/2021 02/03/2021 07/29/2020 05/02/2020 04/11/2020 03/11/2020 02/12/2020  Does Patient Have a Medical Advance Directive? No No No Yes Yes Yes No  Type of Advance Directive - - - - - Press photographer -  Does patient want to make changes to medical advance directive? - - - - - - -  Copy of [place] in Chart? - - - - - - -  Would patient like information on creating a medical advance directive? No - Patient declined No - Patient declined - No - Patient declined - - No - Patient declined    Current Medications (verified) Outpatient Encounter Medications as of 05/08/2021  Medication Sig   albuterol (VENTOLIN HFA) 108 (90 Base) MCG/ACT inhaler Inhale 2 puffs into the lungs every 4 (four) hours as needed.   atenolol (TENORMIN) 100 MG tablet Take 1 tablet (100 mg total) by mouth daily.   fluticasone (FLOVENT HFA) 110 MCG/ACT inhaler Inhale 1 puff into the lungs 2 (two) times daily.   folic acid (FOLVITE) 1 MG tablet Take 1 tablet (1 mg total) by mouth daily.   glucose blood test strip Check blood sugar 2 times daily   LORazepam (ATIVAN) 0.5 MG tablet One tab po three times a day day1; one tab po twice a day day2,3; one tab po daily day 4,5,6   metFORMIN (GLUCOPHAGE) 500 MG tablet Take 1 tablet (500 mg total) by mouth daily.    Multiple Vitamin (MULTIVITAMIN WITH MINERALS) TABS tablet Take 1 tablet by mouth daily.   omeprazole (PRILOSEC) 20 MG capsule Take 1 capsule (20 mg total) by mouth daily.   tamsulosin (FLOMAX) 0.4 MG CAPS capsule Take 1 capsule (0.4 mg total) by mouth daily after supper.   thiamine 100 MG tablet Take 1 tablet (100 mg total) by mouth daily.   No facility-administered encounter medications on file as of 05/08/2021.    Allergies (verified) Patient has no known allergies.   History: Past Medical History:  Diagnosis Date   Diabetes mellitus without complication (HCC)    GERD (gastroesophageal reflux disease)    Hypertension    Past Surgical History:  Procedure Laterality Date   COLONOSCOPY WITH PROPOFOL N/A 07/21/2019   Procedure: COLONOSCOPY WITH PROPOFOL;  Surgeon: Jonathon Bellows, MD;  Location: [facility] ENDOSCOPY;  Service: Gastroenterology;  Laterality: N/A;   ESOPHAGOGASTRODUODENOSCOPY (EGD) WITH PROPOFOL N/A 03/11/2020   Procedure: ESOPHAGOGASTRODUODENOSCOPY (EGD) WITH PROPOFOL;  Surgeon: Jonathon Bellows, MD;  Location: [facility] ENDOSCOPY;  Service: Gastroenterology;  Laterality: N/A;   ESOPHAGOGASTRODUODENOSCOPY (EGD) WITH PROPOFOL N/A 04/11/2020   Procedure: ESOPHAGOGASTRODUODENOSCOPY (EGD) WITH PROPOFOL;  Surgeon: Jonathon Bellows, MD;  Location: [facility] ENDOSCOPY;  Service: Gastroenterology;  Laterality: N/A;   ESOPHAGOGASTRODUODENOSCOPY (EGD) WITH PROPOFOL N/A 07/29/2020   Procedure: ESOPHAGOGASTRODUODENOSCOPY (EGD) WITH PROPOFOL;  Surgeon: Jonathon Bellows, MD;  Location: [facility] ENDOSCOPY;  Service: Gastroenterology;  Laterality: N/A;   EYE SURGERY     FEMUR FRACTURE SURGERY Left    FRACTURE SURGERY     INTRAMEDULLARY (IM) NAIL INTERTROCHANTERIC Left 05/03/2020   Procedure: INTRAMEDULLARY (IM) NAIL INTERTROCHANTRIC;  Surgeon: Leim Fabry, MD;  Location: [facility] ORS;  Service: Orthopedics;  Laterality: Left;   Family History  Problem Relation Age of Onset   Breast cancer Sister    Ovarian cancer Sister     Social History   Socioeconomic History   Marital status: Married    Spouse name: Not on file   Number of children: Not on file   Years of education: Not on file   Highest education level: Not on file  Occupational History   Not on file  Tobacco Use   Smoking status: Every Day    Packs/day: 1.00    Years: 53.00    Pack years: 53.00    Types: Cigarettes   Smokeless tobacco: Never  Vaping Use   Vaping Use: Never used  Substance and Sexual Activity   Alcohol use: Yes   Drug use: No   Sexual activity: Yes  Other Topics Concern   Not on file  Social History Narrative   Not on file   Social Determinants of Health   Financial Resource Strain: Not on file  Food Insecurity: Not on file  Transportation Needs: Not on file  Physical Activity: Not on file  Stress: Not on file  Social Connections: Not on file    Tobacco Counseling Ready to quit: Not Answered Counseling given: Not Answered   Clinical Intake:  Pre-visit preparation completed: Yes  Pain : No/denies pain     Nutritional Risks: None Diabetes: Yes CBG done?: No Did pt. bring in CBG monitor from home?: No  How often do you need to have someone help you when you read instructions, pamphlets, or other written materials from your doctor or pharmacy?: 1 - Never What is the last grade level you completed in school?: 12  Diabetic?yes  Interpreter Needed?: No  Information entered by :: Lacretia Nicks, [place] of Daily Living In your present state of health, do you have any difficulty performing the following activities: 05/08/2021 02/04/2021  Hearing? N N  Vision? N N  Difficulty concentrating or making decisions? N N  Walking or climbing stairs? N N  Dressing or bathing? N N  Doing errands, shopping? N N  Preparing Food and eating ? N -  Using the Toilet? N -  In the past six months, have you accidently leaked urine? N -  Do you have problems with loss of bowel control? N -  Managing your  Medications? N -  Managing your Finances? N -  Housekeeping or managing your Housekeeping? N -  Some recent data might be hidden    Patient Care Team: Cletis Athens, MD as PCP - General (Internal Medicine)  Indicate any recent Medical Services you may have received from other than [facility] providers in the past year (date may be approximate).     Assessment:   This is a routine wellness examination for Kermit.  Hearing/Vision screen No results found.  Dietary issues and exercise activities discussed: Current Exercise Habits: The patient does not participate in regular exercise at present, Exercise limited by: None identified   Goals Addressed   None    Depression Screen PHQ 2/9 Scores 05/08/2021 05/08/2021 05/06/2021 05/11/2017  PHQ - 2 Score 0 0 0 6  PHQ- 9 Score - - - 15  Fall Risk Fall Risk  05/08/2021 05/06/2021 05/11/2017  Falls in the past year? 0 0 No  Number falls in past yr: 0 0 -  Injury with Fall? 0 0 -  Risk for fall due to : No Fall Risks No Fall Risks -  Follow up Falls evaluation completed Falls evaluation completed -    FALL RISK PREVENTION PERTAINING TO THE HOME:  Any stairs in or around the home? No  If so, are there any without handrails? No  Home free of loose throw rugs in walkways, pet beds, electrical cords, etc? Yes  Adequate lighting in your home to reduce risk of falls? Yes   ASSISTIVE DEVICES UTILIZED TO PREVENT FALLS:  Life alert? No  Use of a cane, walker or w/c? No  Grab bars in the bathroom? No  Shower chair or bench in shower? No  Elevated toilet seat or a handicapped toilet? No   TIMED UP AND GO:  Was the test performed? No .  Length of time to ambulate: Not performed    Gait steady and fast without use of assistive device  Cognitive Function: MMSE - Mini Mental State Exam 05/08/2021  Orientation to time 5  Orientation to Place 5  Registration 3  Attention/ Calculation 5  Recall 3  Language- name 2 objects 2  Language- repeat  1  Language- follow 3 step command 3  Language- read & follow direction 1  Write a sentence 0  Copy design 0  Total score 28     6CIT Screen 05/08/2021  What Year? 0 points  What month? 0 points  What time? 0 points  Count back from 20 0 points  Months in reverse 0 points  Repeat phrase 0 points  Total Score 0    Immunizations Immunization History  Administered Date(s) Administered   Fluad Quad(high Dose 65+) 05/10/2019   Influenza, High Dose Seasonal PF 05/11/2017    TDAP status: Due, Education has been provided regarding the importance of this vaccine. Advised may receive this vaccine at local pharmacy or Health Dept. Aware to provide a copy of the vaccination record if obtained from local pharmacy or Health Dept. Verbalized acceptance and understanding.  Flu Vaccine status: Up to date  Pneumococcal vaccine status: Due, Education has been provided regarding the importance of this vaccine. Advised may receive this vaccine at local pharmacy or Health Dept. Aware to provide a copy of the vaccination record if obtained from local pharmacy or Health Dept. Verbalized acceptance and understanding.  Covid-19 vaccine status: Completed vaccines  Qualifies for Shingles Vaccine? Yes   Zostavax completed No   Shingrix Completed?: No.    Education has been provided regarding the importance of this vaccine. Patient has been advised to call insurance company to determine out of pocket expense if they have not yet received this vaccine. Advised may also receive vaccine at local pharmacy or Health Dept. Verbalized acceptance and understanding.  Screening Tests Health Maintenance  Topic Date Due   COVID-19 Vaccine (1) Never done   FOOT EXAM  Never done   OPHTHALMOLOGY EXAM  Never done   URINE MICROALBUMIN  Never done   TETANUS/TDAP  Never done   Zoster Vaccines- Shingrix (1 of 2) Never done   HEMOGLOBIN A1C  10/30/2020   INFLUENZA VACCINE  03/17/2021   COLONOSCOPY (Pts 45-32yrs  Insurance coverage will need to be confirmed)  07/20/2022   Hepatitis C Screening  Completed   HPV VACCINES  Aged Out    Health Maintenance  Health Maintenance Due  Topic Date Due   COVID-19 Vaccine (1) Never done   FOOT EXAM  Never done   OPHTHALMOLOGY EXAM  Never done   URINE MICROALBUMIN  Never done   TETANUS/TDAP  Never done   Zoster Vaccines- Shingrix (1 of 2) Never done   HEMOGLOBIN A1C  10/30/2020   INFLUENZA VACCINE  03/17/2021    Colorectal cancer screening: Type of screening: Colonoscopy. Completed 2020. Repeat every 10 years  Lung Cancer Screening: (Low Dose CT Chest recommended if Age 7-80 years, 30 pack-year currently smoking OR have quit w/in 15years.) does qualify.   Lung Cancer Screening Referral: declined   Additional Screening:  Hepatitis C Screening: does not qualify; Completed no  Vision Screening: Recommended annual ophthalmology exams for early detection of glaucoma and other disorders of the eye. Is the patient up to date with their annual eye exam?  No  Who is the provider or what is the name of the office in which the patient attends annual eye exams? [facility]  If pt is not established with a provider, would they like to be referred to a provider to establish care?  Established .   Dental Screening: Recommended annual dental exams for proper oral hygiene  Community Resource Referral / Chronic Care Management: CRR required this visit?  No   CCM required this visit?  No      Plan:     I have personally reviewed and noted the following in the patient's chart:   Medical and social history Use of alcohol, tobacco or illicit drugs  Current medications and supplements including opioid prescriptions. Patient is not currently taking opioid prescriptions. Functional ability and status Nutritional status Physical activity Advanced directives List of other physicians Hospitalizations, surgeries, and ER visits in previous 12  months Vitals Screenings to include cognitive, depression, and falls Referrals and appointments  In addition, I have reviewed and discussed with patient certain preventive protocols, quality metrics, and best practice recommendations. A written personalized care plan for preventive services as well as general preventive health recommendations were provided to patient.     Lacretia Nicks, [place]   05/08/2021   Nurse Notes:  Mr. Harari , Thank you for taking time to come for your Medicare Wellness Visit. I appreciate your ongoing commitment to your health goals. Please review the following plan we discussed and let me know if I can assist you in the future.   These are the goals we discussed:  Goals   None     This is a list of the screening recommended for you and due dates:  Health Maintenance  Topic Date Due   COVID-19 Vaccine (1) Never done   Complete foot exam   Never done   Eye exam for diabetics  Never done   Urine Protein Check  Never done   Tetanus Vaccine  Never done   Zoster (Shingles) Vaccine (1 of 2) Never done   Hemoglobin A1C  10/30/2020   Flu Shot  03/17/2021   Colon Cancer Screening  07/20/2022   Hepatitis C Screening: USPSTF Recommendation to screen - Ages 18-79 yo.  Completed   HPV Vaccine  Aged Out        Time spent with patient 30 min

## 2021-05-09 NOTE — Progress Notes (Signed)
I have reviewed this visit and agree with the documentation.   

## 2021-07-08 ENCOUNTER — Other Ambulatory Visit: Payer: Self-pay

## 2021-07-08 ENCOUNTER — Encounter: Payer: Self-pay | Admitting: Internal Medicine

## 2021-07-08 ENCOUNTER — Ambulatory Visit (INDEPENDENT_AMBULATORY_CARE_PROVIDER_SITE_OTHER): Payer: PPO | Admitting: Internal Medicine

## 2021-07-08 VITALS — BP 139/90 | HR 73 | Ht 69.0 in | Wt 121.5 lb

## 2021-07-08 DIAGNOSIS — E1169 Type 2 diabetes mellitus with other specified complication: Secondary | ICD-10-CM

## 2021-07-08 DIAGNOSIS — G4700 Insomnia, unspecified: Secondary | ICD-10-CM | POA: Diagnosis not present

## 2021-07-08 DIAGNOSIS — E785 Hyperlipidemia, unspecified: Secondary | ICD-10-CM | POA: Diagnosis not present

## 2021-07-08 DIAGNOSIS — F10931 Alcohol use, unspecified with withdrawal delirium: Secondary | ICD-10-CM

## 2021-07-08 DIAGNOSIS — F419 Anxiety disorder, unspecified: Secondary | ICD-10-CM | POA: Diagnosis not present

## 2021-07-08 DIAGNOSIS — E7800 Pure hypercholesterolemia, unspecified: Secondary | ICD-10-CM

## 2021-07-08 LAB — GLUCOSE, POCT (MANUAL RESULT ENTRY): POC Glucose: 105 mg/dl — AB (ref 70–99)

## 2021-07-08 MED ORDER — CITALOPRAM HYDROBROMIDE 10 MG PO TABS: 10.0000 mg | ORAL_TABLET | Freq: Every day | ORAL | 3 refills | Status: AC

## 2021-07-08 NOTE — Progress Notes (Signed)
Established Patient Office Visit  Subjective:  Patient ID: Derrick Jones, male    DOB: 02-23-1946  Age: 75 y.o. MRN: [id number]  CC:  Chief Complaint  Patient presents with   Insomnia    Patient states he has had difficulty sleeping and eating. Patient reports his daughter died 2 weeks ago and he has had difficulty sleeping and eating.    Insomnia   Derrick Jones presents for depression  Past Medical History:  Diagnosis Date   Diabetes mellitus without complication ([place])    GERD (gastroesophageal reflux disease)    Hypertension     Past Surgical History:  Procedure Laterality Date   COLONOSCOPY WITH PROPOFOL N/A 07/21/2019   Procedure: COLONOSCOPY WITH PROPOFOL;  Surgeon: Jonathon Bellows, MD;  Location: [facility] ENDOSCOPY;  Service: Gastroenterology;  Laterality: N/A;   ESOPHAGOGASTRODUODENOSCOPY (EGD) WITH PROPOFOL N/A 03/11/2020   Procedure: ESOPHAGOGASTRODUODENOSCOPY (EGD) WITH PROPOFOL;  Surgeon: Jonathon Bellows, MD;  Location: [facility] ENDOSCOPY;  Service: Gastroenterology;  Laterality: N/A;   ESOPHAGOGASTRODUODENOSCOPY (EGD) WITH PROPOFOL N/A 04/11/2020   Procedure: ESOPHAGOGASTRODUODENOSCOPY (EGD) WITH PROPOFOL;  Surgeon: Jonathon Bellows, MD;  Location: [facility] ENDOSCOPY;  Service: Gastroenterology;  Laterality: N/A;   ESOPHAGOGASTRODUODENOSCOPY (EGD) WITH PROPOFOL N/A 07/29/2020   Procedure: ESOPHAGOGASTRODUODENOSCOPY (EGD) WITH PROPOFOL;  Surgeon: Jonathon Bellows, MD;  Location: [facility] ENDOSCOPY;  Service: Gastroenterology;  Laterality: N/A;   EYE SURGERY     FEMUR FRACTURE SURGERY Left    FRACTURE SURGERY     INTRAMEDULLARY (IM) NAIL INTERTROCHANTERIC Left 05/03/2020   Procedure: INTRAMEDULLARY (IM) NAIL INTERTROCHANTRIC;  Surgeon: Leim Fabry, MD;  Location: [facility] ORS;  Service: Orthopedics;  Laterality: Left;    Family History  Problem Relation Age of Onset   Breast cancer Sister    Ovarian cancer Sister     Social History   Socioeconomic History   Marital status: Married    Spouse name:  Not on file   Number of children: Not on file   Years of education: Not on file   Highest education level: Not on file  Occupational History   Not on file  Tobacco Use   Smoking status: Every Day    Packs/day: 1.00    Years: 53.00    Pack years: 53.00    Types: Cigarettes   Smokeless tobacco: Never  Vaping Use   Vaping Use: Never used  Substance and Sexual Activity   Alcohol use: Yes   Drug use: No   Sexual activity: Yes  Other Topics Concern   Not on file  Social History Narrative   Not on file   Social Determinants of Health   Financial Resource Strain: Not on file  Food Insecurity: Not on file  Transportation Needs: Not on file  Physical Activity: Not on file  Stress: Not on file  Social Connections: Not on file  Intimate Partner Violence: Not At Risk   Fear of Current or Ex-Partner: No   Emotionally Abused: No   Physically Abused: No   Sexually Abused: No     Current Outpatient Medications:    albuterol (VENTOLIN HFA) 108 (90 Base) MCG/ACT inhaler, Inhale 2 puffs into the lungs every 4 (four) hours as needed., Disp: , Rfl:    atenolol (TENORMIN) 100 MG tablet, Take 1 tablet (100 mg total) by mouth daily., Disp: 90 tablet, Rfl: 3   fluticasone (FLOVENT HFA) 110 MCG/ACT inhaler, Inhale 1 puff into the lungs 2 (two) times daily., Disp: 1 each, Rfl: 12   folic acid (FOLVITE) 1 MG tablet, Take  1 tablet (1 mg total) by mouth daily., Disp: 30 tablet, Rfl: 0   glucose blood (ONETOUCH VERIO) test strip, Use as instructed, Disp: 100 each, Rfl: 12   glucose blood test strip, Check blood sugar 2 times daily, Disp: 100 each, Rfl: 12   LORazepam (ATIVAN) 0.5 MG tablet, One tab po three times a day day1; one tab po twice a day day2,3; one tab po daily day 4,5,6, Disp: 10 tablet, Rfl: 0   metFORMIN (GLUCOPHAGE) 500 MG tablet, Take 1 tablet (500 mg total) by mouth daily., Disp: 90 tablet, Rfl: 3   Multiple Vitamin (MULTIVITAMIN WITH MINERALS) TABS tablet, Take 1 tablet by mouth  daily., Disp: , Rfl:    omeprazole (PRILOSEC) 20 MG capsule, Take 1 capsule (20 mg total) by mouth daily., Disp: 90 capsule, Rfl: 3   tamsulosin (FLOMAX) 0.4 MG CAPS capsule, Take 1 capsule (0.4 mg total) by mouth daily after supper., Disp: 30 capsule, Rfl: 0   thiamine 100 MG tablet, Take 1 tablet (100 mg total) by mouth daily., Disp: 30 tablet, Rfl: 0   No Known Allergies  ROS Review of Systems  Constitutional: Negative.   HENT: Negative.    Eyes: Negative.   Respiratory: Negative.    Cardiovascular: Negative.   Gastrointestinal: Negative.   Endocrine: Negative.   Genitourinary: Negative.   Musculoskeletal: Negative.   Skin: Negative.   Allergic/Immunologic: Negative.   Neurological: Negative.   Hematological: Negative.   Psychiatric/Behavioral:  The patient has insomnia.   All other systems reviewed and are negative.    Objective:    Physical Exam Vitals reviewed.  Constitutional:      Appearance: Normal appearance.  HENT:     Mouth/Throat:     Mouth: Mucous membranes are moist.  Eyes:     Pupils: Pupils are equal, round, and reactive to light.  Neck:     Vascular: No carotid bruit.  Cardiovascular:     Rate and Rhythm: Normal rate and regular rhythm.     Pulses: Normal pulses.     Heart sounds: Normal heart sounds.  Pulmonary:     Effort: Pulmonary effort is normal.     Breath sounds: Normal breath sounds.  Abdominal:     General: Bowel sounds are normal.     Palpations: Abdomen is soft. There is no hepatomegaly, splenomegaly or mass.     Tenderness: There is no abdominal tenderness.     Hernia: No hernia is present.  Musculoskeletal:     Cervical back: Neck supple.     Right lower leg: No edema.     Left lower leg: No edema.  Skin:    Findings: No rash.  Neurological:     Mental Status: He is alert and oriented to person, place, and time.     Motor: No weakness.  Psychiatric:        Mood and Affect: Mood normal.        Behavior: Behavior normal.     BP 139/90   Pulse 73   Ht 5\' 9"  (1.753 m)   Wt 121 lb 8 oz (55.1 kg)   BMI 17.94 kg/m  Wt Readings from Last 3 Encounters:  07/08/21 121 lb 8 oz (55.1 kg)  05/06/21 119 lb 8 oz (54.2 kg)  02/12/21 125 lb (56.7 kg)     Health Maintenance Due  Topic Date Due   COVID-19 Vaccine (1) Never done   Pneumonia Vaccine 23+ Years old (1 - PCV) Never done   FOOT  EXAM  Never done   OPHTHALMOLOGY EXAM  Never done   URINE MICROALBUMIN  Never done   TETANUS/TDAP  Never done   Zoster Vaccines- Shingrix (1 of 2) Never done   HEMOGLOBIN A1C  10/30/2020   INFLUENZA VACCINE  03/17/2021    There are no preventive care reminders to display for this patient.  No results found for: TSH Lab Results  Component Value Date   WBC 9.2 02/05/2021   HGB 13.4 02/05/2021   HCT 37.4 (L) 02/05/2021   MCV 90.6 02/05/2021   PLT 203 02/05/2021   Lab Results  Component Value Date   NA 133 (L) 02/05/2021   K 3.9 02/05/2021   CO2 25 02/05/2021   GLUCOSE 95 02/05/2021   BUN 10 02/05/2021   CREATININE 0.77 02/05/2021   BILITOT 2.4 (H) 02/04/2021   ALKPHOS 57 02/04/2021   AST 49 (H) 02/04/2021   ALT 27 02/04/2021   PROT 7.0 02/04/2021   ALBUMIN 4.0 02/04/2021   CALCIUM 8.6 (L) 02/05/2021   ANIONGAP 11 02/05/2021   Lab Results  Component Value Date   CHOL 227 (H) 07/02/2017   Lab Results  Component Value Date   HDL 45 07/02/2017   Lab Results  Component Value Date   LDLCALC 145 (H) 07/02/2017   Lab Results  Component Value Date   TRIG 229 (H) 07/02/2017   Lab Results  Component Value Date   CHOLHDL 5.0 (H) 07/02/2017   Lab Results  Component Value Date   HGBA1C 6.4 (H) 05/02/2020      Assessment & Plan:   Problem List Items Addressed This Visit       Endocrine   Type 2 diabetes mellitus with hyperlipidemia ([place]) - Primary    - The patient's blood sugar is labile on med. - The patient will continue the current treatment regimen.  - I encouraged the patient to regularly  check blood sugar.  - I encouraged the patient to monitor diet. I encouraged the patient to eat low-carb and low-sugar to help prevent blood sugar spikes.  - I encouraged the patient to continue following their prescribed treatment plan for diabetes - I informed the patient to get help if blood sugar drops below 54mg /dL, or if suddenly have trouble thinking clearly or breathing.  Patient was advised to buy a book on diabetes from a local bookstore or from [place]  Patient should read 2 chapters every day to keep the motivation going, this is in addition to some of the materials we provided them from the office.  There are other resources on the Internet like YouTube and wilkipedia to get an education on the diabetes      Relevant Orders   POCT glucose (manual entry) (Completed)     Nervous and Auditory   Alcohol use with withdrawal delirium ([place])    Patient does not drink anymore.  He is anxious so I will start him on antidepressant.        Other   Anxiety     Keeping a stress/anxiety diary. This can help you learn what triggers your reaction and then learn ways to manage your response.  Thinking about how you react to certain situations. You may not be able to control everything, but you can control your response.  Making time for activities that help you relax and not feeling guilty about spending your time in this way.  Visual imagery and yoga can help you stay calm and relax. Sorry about that  Insomnia    Antidepressant will help with insomnia      Hypercholesterolemia    Hypercholesterolemia  I advised the patient to follow Mediterranean diet This diet is rich in fruits vegetables and whole grain, and This diet is also rich in fish and lean meat Patient should also eat a handful of almonds or walnuts daily Recent heart study indicated that average follow-up on this kind of diet reduces the cardiovascular mortality by 50 to 70%==       No orders of the defined  types were placed in this encounter.   Follow-up: No follow-ups on file.    Cletis Athens, MD

## 2021-07-08 NOTE — Assessment & Plan Note (Signed)
   Keeping a stress/anxiety diary. This can help you learn what triggers your reaction and then learn ways to manage your response.  Thinking about how you react to certain situations. You may not be able to control everything, but you can control your response.  Making time for activities that help you relax and not feeling guilty about spending your time in this way.  Visual imagery and yoga can help you stay calm and relax. Sorry about that

## 2021-07-08 NOTE — Assessment & Plan Note (Signed)
Antidepressant will help with insomnia

## 2021-07-08 NOTE — Assessment & Plan Note (Signed)
Hypercholesterolemia  I advised the patient to follow Mediterranean diet This diet is rich in fruits vegetables and whole grain, and This diet is also rich in fish and lean meat Patient should also eat a handful of almonds or walnuts daily Recent heart study indicated that average follow-up on this kind of diet reduces the cardiovascular mortality by 50 to 70%== 

## 2021-07-08 NOTE — Assessment & Plan Note (Signed)
Patient does not drink anymore.  He is anxious so I will start him on antidepressant.

## 2021-07-08 NOTE — Assessment & Plan Note (Signed)

## 2021-08-01 ENCOUNTER — Ambulatory Visit: Payer: PPO | Admitting: Internal Medicine

## 2021-11-17 ENCOUNTER — Other Ambulatory Visit: Payer: Self-pay | Admitting: *Deleted

## 2021-11-17 MED ORDER — ALBUTEROL SULFATE HFA 108 (90 BASE) MCG/ACT IN AERS: 2.0000 | INHALATION_SPRAY | Freq: Four times a day (QID) | RESPIRATORY_TRACT | 0 refills | Status: AC | PRN

## 2022-04-19 ENCOUNTER — Other Ambulatory Visit: Payer: Self-pay | Admitting: Internal Medicine

## 2022-04-30 ENCOUNTER — Other Ambulatory Visit: Payer: Self-pay | Admitting: *Deleted

## 2022-04-30 MED ORDER — ATENOLOL 100 MG PO TABS: 100.0000 mg | ORAL_TABLET | Freq: Every day | ORAL | 2 refills | Status: AC

## 2022-05-04 ENCOUNTER — Other Ambulatory Visit: Payer: Self-pay | Admitting: *Deleted

## 2022-05-04 MED ORDER — ALBUTEROL SULFATE HFA 108 (90 BASE) MCG/ACT IN AERS: 2.0000 | INHALATION_SPRAY | RESPIRATORY_TRACT | 1 refills | Status: AC | PRN

## 2022-08-13 IMAGING — DX DG CHEST 1V PORT
1 series · 1 of 1 positions shown · non-contrast
Comparison: May 02, 2020

CLINICAL DATA: [id number].

EXAM:
PORTABLE CHEST 1 VIEW

[chest ap]
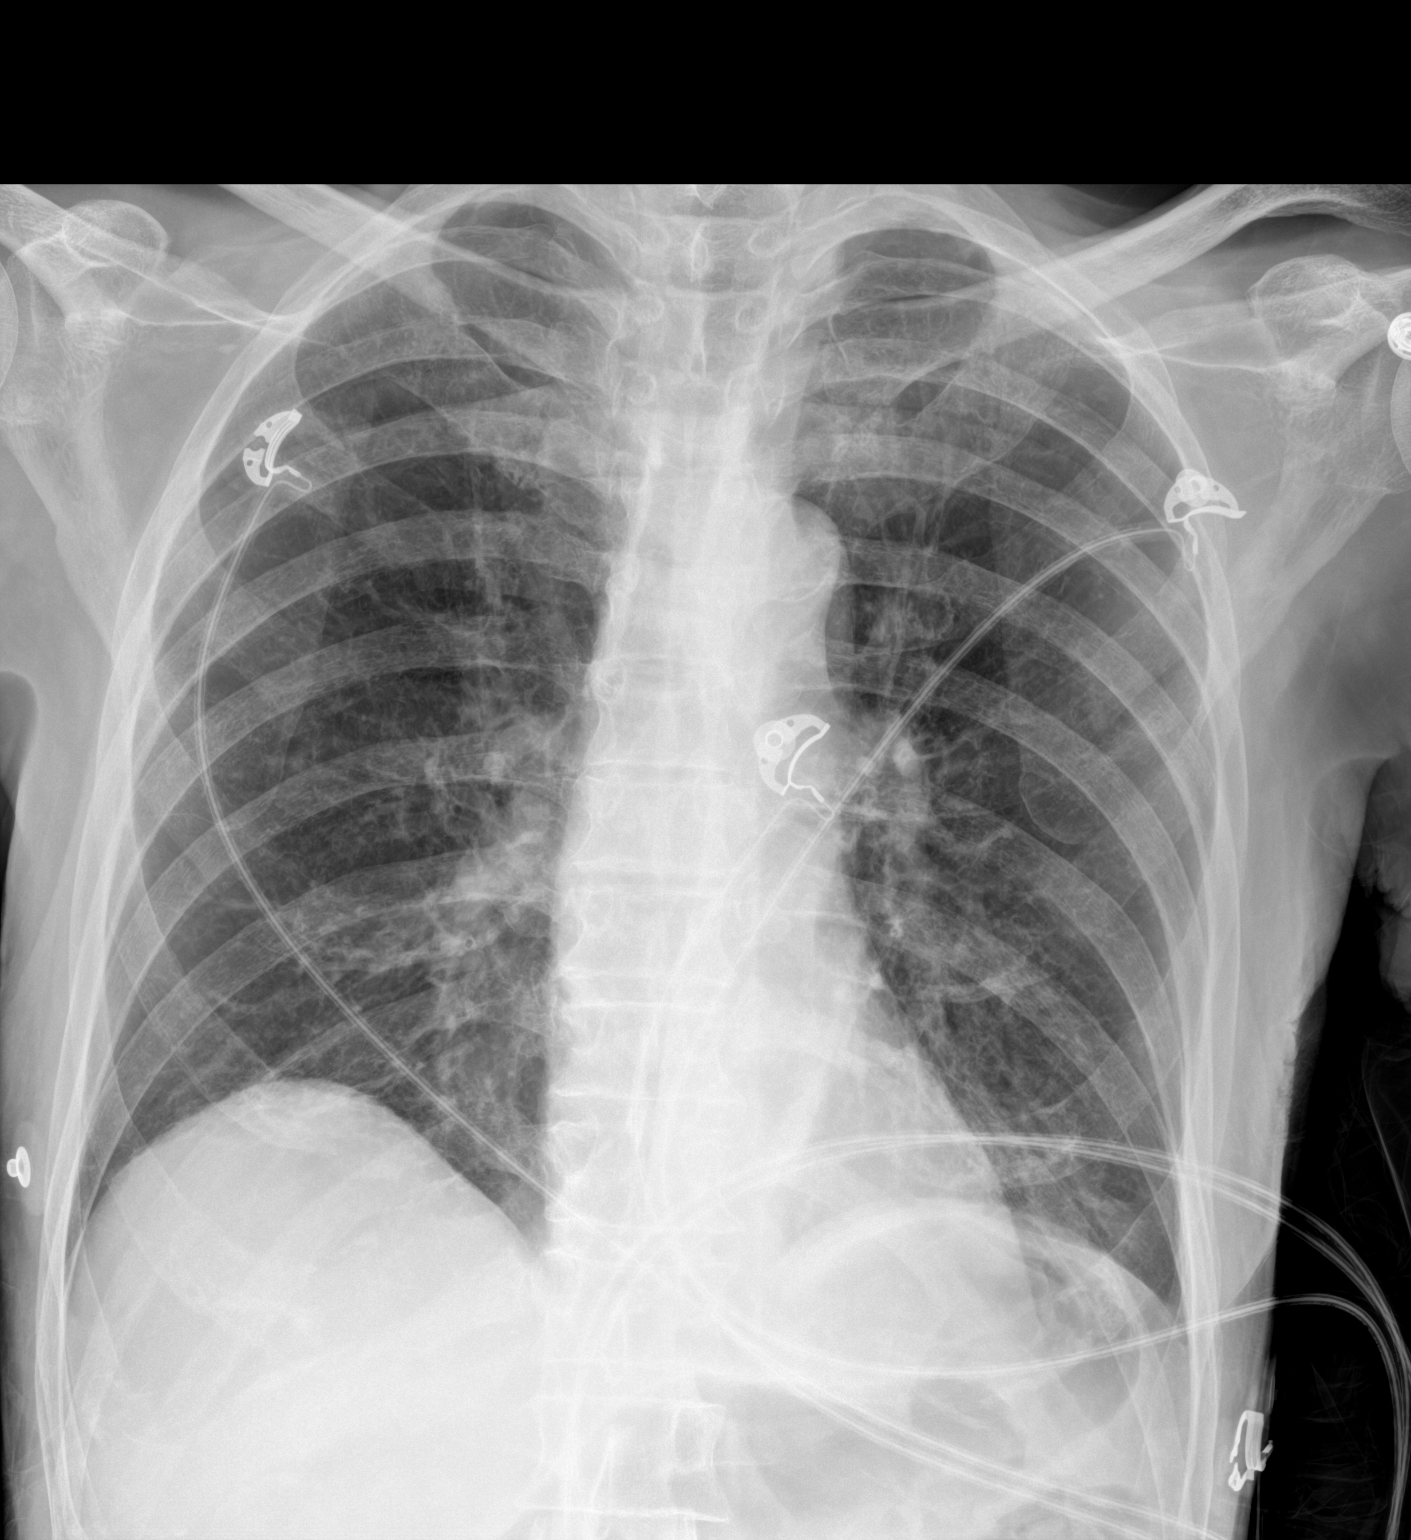

[1 of 1 positions shown; findings below may reference images not displayed]

FINDINGS: The heart, hila, and mediastinum are normal. No pneumothorax. No
pulmonary nodules or masses. Minimal opacity in left base. No other
acute abnormalities.
IMPRESSION: Minimal opacity in left base could represent developing infiltrate
versus atelectasis. No other acute interval changes or
abnormalities.

## 2022-08-21 ENCOUNTER — Emergency Department: Payer: PPO

## 2022-08-21 ENCOUNTER — Observation Stay: Payer: PPO

## 2022-08-21 ENCOUNTER — Encounter: Payer: Self-pay | Admitting: Internal Medicine

## 2022-08-21 ENCOUNTER — Other Ambulatory Visit: Payer: Self-pay

## 2022-08-21 ENCOUNTER — Inpatient Hospital Stay
Admission: EM | Admit: 2022-08-21 | Discharge: 2022-08-22 | DRG: 071 | Disposition: A | Discharge status: Other Acute Inpatient Hospital | Payer: PPO | Attending: Obstetrics and Gynecology | Admitting: Obstetrics and Gynecology

## 2022-08-21 DIAGNOSIS — N179 Acute kidney failure, unspecified: Secondary | ICD-10-CM | POA: Diagnosis present

## 2022-08-21 DIAGNOSIS — Z7951 Long term (current) use of inhaled steroids: Secondary | ICD-10-CM | POA: Diagnosis not present

## 2022-08-21 DIAGNOSIS — N281 Cyst of kidney, acquired: Secondary | ICD-10-CM | POA: Diagnosis not present

## 2022-08-21 DIAGNOSIS — W19XXXA Unspecified fall, initial encounter: Secondary | ICD-10-CM | POA: Diagnosis not present

## 2022-08-21 DIAGNOSIS — J929 Pleural plaque without asbestos: Secondary | ICD-10-CM | POA: Diagnosis not present

## 2022-08-21 DIAGNOSIS — Z7984 Long term (current) use of oral hypoglycemic drugs: Secondary | ICD-10-CM | POA: Diagnosis not present

## 2022-08-21 DIAGNOSIS — D696 Thrombocytopenia, unspecified: Secondary | ICD-10-CM | POA: Diagnosis not present

## 2022-08-21 DIAGNOSIS — F1021 Alcohol dependence, in remission: Secondary | ICD-10-CM | POA: Diagnosis not present

## 2022-08-21 DIAGNOSIS — E785 Hyperlipidemia, unspecified: Secondary | ICD-10-CM | POA: Diagnosis present

## 2022-08-21 DIAGNOSIS — K8020 Calculus of gallbladder without cholecystitis without obstruction: Secondary | ICD-10-CM | POA: Diagnosis not present

## 2022-08-21 DIAGNOSIS — F411 Generalized anxiety disorder: Secondary | ICD-10-CM | POA: Diagnosis present

## 2022-08-21 DIAGNOSIS — F10939 Alcohol use, unspecified with withdrawal, unspecified: Secondary | ICD-10-CM

## 2022-08-21 DIAGNOSIS — F419 Anxiety disorder, unspecified: Secondary | ICD-10-CM | POA: Diagnosis not present

## 2022-08-21 DIAGNOSIS — R945 Abnormal results of liver function studies: Secondary | ICD-10-CM | POA: Diagnosis present

## 2022-08-21 DIAGNOSIS — Z8711 Personal history of peptic ulcer disease: Secondary | ICD-10-CM

## 2022-08-21 DIAGNOSIS — G40909 Epilepsy, unspecified, not intractable, without status epilepticus: Secondary | ICD-10-CM | POA: Diagnosis present

## 2022-08-21 DIAGNOSIS — F17210 Nicotine dependence, cigarettes, uncomplicated: Secondary | ICD-10-CM | POA: Diagnosis not present

## 2022-08-21 DIAGNOSIS — R4182 Altered mental status, unspecified: Secondary | ICD-10-CM | POA: Diagnosis not present

## 2022-08-21 DIAGNOSIS — J439 Emphysema, unspecified: Secondary | ICD-10-CM | POA: Diagnosis not present

## 2022-08-21 DIAGNOSIS — N400 Enlarged prostate without lower urinary tract symptoms: Secondary | ICD-10-CM | POA: Diagnosis not present

## 2022-08-21 DIAGNOSIS — I700 Atherosclerosis of aorta: Secondary | ICD-10-CM | POA: Diagnosis not present

## 2022-08-21 DIAGNOSIS — A419 Sepsis, unspecified organism: Secondary | ICD-10-CM | POA: Diagnosis not present

## 2022-08-21 DIAGNOSIS — Z79899 Other long term (current) drug therapy: Secondary | ICD-10-CM | POA: Diagnosis not present

## 2022-08-21 DIAGNOSIS — R7989 Other specified abnormal findings of blood chemistry: Secondary | ICD-10-CM | POA: Diagnosis present

## 2022-08-21 DIAGNOSIS — K219 Gastro-esophageal reflux disease without esophagitis: Secondary | ICD-10-CM | POA: Diagnosis present

## 2022-08-21 DIAGNOSIS — R12 Heartburn: Secondary | ICD-10-CM | POA: Diagnosis not present

## 2022-08-21 DIAGNOSIS — E872 Acidosis, unspecified: Secondary | ICD-10-CM | POA: Diagnosis not present

## 2022-08-21 DIAGNOSIS — I10 Essential (primary) hypertension: Secondary | ICD-10-CM | POA: Diagnosis not present

## 2022-08-21 DIAGNOSIS — Z8719 Personal history of other diseases of the digestive system: Secondary | ICD-10-CM | POA: Diagnosis not present

## 2022-08-21 DIAGNOSIS — Z20822 Contact with and (suspected) exposure to covid-19: Secondary | ICD-10-CM | POA: Diagnosis not present

## 2022-08-21 DIAGNOSIS — R0902 Hypoxemia: Secondary | ICD-10-CM | POA: Diagnosis not present

## 2022-08-21 DIAGNOSIS — K760 Fatty (change of) liver, not elsewhere classified: Secondary | ICD-10-CM | POA: Diagnosis not present

## 2022-08-21 DIAGNOSIS — G9340 Encephalopathy, unspecified: Secondary | ICD-10-CM | POA: Diagnosis not present

## 2022-08-21 DIAGNOSIS — R55 Syncope and collapse: Secondary | ICD-10-CM | POA: Insufficient documentation

## 2022-08-21 DIAGNOSIS — E1169 Type 2 diabetes mellitus with other specified complication: Secondary | ICD-10-CM | POA: Diagnosis not present

## 2022-08-21 DIAGNOSIS — R932 Abnormal findings on diagnostic imaging of liver and biliary tract: Secondary | ICD-10-CM | POA: Diagnosis not present

## 2022-08-21 DIAGNOSIS — R634 Abnormal weight loss: Secondary | ICD-10-CM | POA: Diagnosis not present

## 2022-08-21 DIAGNOSIS — R918 Other nonspecific abnormal finding of lung field: Secondary | ICD-10-CM | POA: Diagnosis not present

## 2022-08-21 DIAGNOSIS — R000 Tachycardia, unspecified: Secondary | ICD-10-CM | POA: Diagnosis present

## 2022-08-21 DIAGNOSIS — R7303 Prediabetes: Secondary | ICD-10-CM | POA: Diagnosis present

## 2022-08-21 DIAGNOSIS — R531 Weakness: Secondary | ICD-10-CM | POA: Diagnosis not present

## 2022-08-21 DIAGNOSIS — F1011 Alcohol abuse, in remission: Secondary | ICD-10-CM | POA: Diagnosis present

## 2022-08-21 DIAGNOSIS — F4323 Adjustment disorder with mixed anxiety and depressed mood: Secondary | ICD-10-CM | POA: Diagnosis not present

## 2022-08-21 DIAGNOSIS — K5730 Diverticulosis of large intestine without perforation or abscess without bleeding: Secondary | ICD-10-CM | POA: Diagnosis not present

## 2022-08-21 DIAGNOSIS — E7800 Pure hypercholesterolemia, unspecified: Secondary | ICD-10-CM | POA: Diagnosis not present

## 2022-08-21 DIAGNOSIS — E876 Hypokalemia: Secondary | ICD-10-CM | POA: Diagnosis not present

## 2022-08-21 DIAGNOSIS — F10239 Alcohol dependence with withdrawal, unspecified: Secondary | ICD-10-CM | POA: Diagnosis not present

## 2022-08-21 DIAGNOSIS — I469 Cardiac arrest, cause unspecified: Secondary | ICD-10-CM | POA: Diagnosis not present

## 2022-08-21 DIAGNOSIS — J9601 Acute respiratory failure with hypoxia: Secondary | ICD-10-CM | POA: Diagnosis not present

## 2022-08-21 DIAGNOSIS — F1010 Alcohol abuse, uncomplicated: Secondary | ICD-10-CM | POA: Diagnosis not present

## 2022-08-21 DIAGNOSIS — R579 Shock, unspecified: Secondary | ICD-10-CM | POA: Diagnosis not present

## 2022-08-21 DIAGNOSIS — R569 Unspecified convulsions: Secondary | ICD-10-CM | POA: Diagnosis not present

## 2022-08-21 HISTORY — DX: Acute kidney failure, unspecified: N17.9

## 2022-08-21 LAB — CBC WITH DIFFERENTIAL/PLATELET
Abs Immature Granulocytes: 0.05 10*3/uL (ref 0.00–0.07)
Basophils Absolute: 0 10*3/uL (ref 0.0–0.1)
Basophils Relative: 0 %
Eosinophils Absolute: 0 10*3/uL (ref 0.0–0.5)
Eosinophils Relative: 0 %
HCT: 45.5 % (ref 39.0–52.0)
Hemoglobin: 15 g/dL (ref 13.0–17.0)
Immature Granulocytes: 1 %
Lymphocytes Relative: 18 %
Lymphs Abs: 1.7 10*3/uL (ref 0.7–4.0)
MCH: 31.6 pg (ref 26.0–34.0)
MCHC: 33 g/dL (ref 30.0–36.0)
MCV: 95.8 fL (ref 80.0–100.0)
Monocytes Absolute: 0.6 10*3/uL (ref 0.1–1.0)
Monocytes Relative: 7 %
Neutro Abs: 6.9 10*3/uL (ref 1.7–7.7)
Neutrophils Relative %: 74 %
Platelets: 152 10*3/uL (ref 150–400)
RBC: 4.75 MIL/uL (ref 4.22–5.81)
RDW: 15.2 % (ref 11.5–15.5)
WBC: 9.3 10*3/uL (ref 4.0–10.5)
nRBC: 0 % (ref 0.0–0.2)

## 2022-08-21 LAB — BETA-HYDROXYBUTYRIC ACID: Beta-Hydroxybutyric Acid: 6.11 mmol/L — ABNORMAL HIGH (ref 0.05–0.27)

## 2022-08-21 LAB — ACETAMINOPHEN LEVEL: Acetaminophen (Tylenol), Serum: <10 ug/mL — ABNORMAL LOW (ref 10–30)

## 2022-08-21 LAB — CK: Total CK: 330 U/L (ref 49–397)

## 2022-08-21 LAB — SODIUM, URINE, RANDOM: Sodium, Ur: 52 mmol/L

## 2022-08-21 LAB — D-DIMER, QUANTITATIVE: D-Dimer, Quant: 0.46 ug/mL-FEU (ref 0.00–0.50)

## 2022-08-21 LAB — GLUCOSE, CAPILLARY: Glucose-Capillary: 184 mg/dL — ABNORMAL HIGH (ref 70–99)

## 2022-08-21 LAB — TROPONIN I (HIGH SENSITIVITY): Troponin I (High Sensitivity): 21 ng/L — ABNORMAL HIGH (ref <18)

## 2022-08-21 LAB — LACTIC ACID, PLASMA
Lactic Acid, Venous: 5.2 mmol/L (ref 0.5–1.9)
Lactic Acid, Venous: 2.4 mmol/L (ref 0.5–1.9)

## 2022-08-21 LAB — RESP PANEL BY RT-PCR (RSV, FLU A&B, COVID)  RVPGX2
Influenza A by PCR: NEGATIVE
Influenza B by PCR: NEGATIVE
Resp Syncytial Virus by PCR: NEGATIVE
SARS Coronavirus 2 by RT PCR: NEGATIVE

## 2022-08-21 LAB — CREATININE, URINE, RANDOM: Creatinine, Urine: 83 mg/dL

## 2022-08-21 LAB — ETHANOL: Alcohol, Ethyl (B): <10 mg/dL (ref <10)

## 2022-08-21 LAB — PROTIME-INR
INR: 1.1 (ref 0.8–1.2)
Prothrombin Time: 14.4 seconds (ref 11.4–15.2)

## 2022-08-21 LAB — CBG MONITORING, ED: Glucose-Capillary: 171 mg/dL — ABNORMAL HIGH (ref 70–99)

## 2022-08-21 LAB — AMMONIA: Ammonia: 16 umol/L (ref 9–35)

## 2022-08-21 LAB — MAGNESIUM: Magnesium: 2.2 mg/dL (ref 1.7–2.4)

## 2022-08-21 LAB — SALICYLATE LEVEL: Salicylate Lvl: <7 mg/dL — ABNORMAL LOW (ref 7.0–30.0)

## 2022-08-21 LAB — BLOOD GAS, VENOUS

## 2022-08-21 MED ORDER — DIAZEPAM 5 MG/ML IJ SOLN
20.0000 mg | Freq: Once | INTRAMUSCULAR | Status: AC
  Administered 2022-08-21: 20 mg via INTRAVENOUS
  Filled 2022-08-21: qty 4

## 2022-08-21 MED ORDER — CHLORDIAZEPOXIDE HCL 25 MG PO CAPS
50.0000 mg | ORAL_CAPSULE | Freq: Once | ORAL | Status: AC
  Administered 2022-08-22: 50 mg via ORAL
  Filled 2022-08-21: qty 2

## 2022-08-21 MED ORDER — ATENOLOL 25 MG PO TABS
100.0000 mg | ORAL_TABLET | Freq: Every day | ORAL | Status: DC
  Filled 2022-08-21: qty 4

## 2022-08-21 MED ORDER — LORAZEPAM 2 MG/ML IJ SOLN: 0.0000 mg | Freq: Three times a day (TID) | INTRAMUSCULAR | Status: DC

## 2022-08-21 MED ORDER — LORAZEPAM 1 MG PO TABS: 1.0000 mg | ORAL_TABLET | ORAL | Status: DC | PRN

## 2022-08-21 MED ORDER — THIAMINE MONONITRATE 100 MG PO TABS
100.0000 mg | ORAL_TABLET | Freq: Every day | ORAL | Status: DC
  Filled 2022-08-21 (×2): qty 1

## 2022-08-21 MED ORDER — THIAMINE HCL 100 MG/ML IJ SOLN
100.0000 mg | Freq: Every day | INTRAMUSCULAR | Status: DC
  Administered 2022-08-22: 100 mg via INTRAVENOUS
  Filled 2022-08-21 (×2): qty 2

## 2022-08-21 MED ORDER — ALBUTEROL SULFATE HFA 108 (90 BASE) MCG/ACT IN AERS: 2.0000 | INHALATION_SPRAY | Freq: Four times a day (QID) | RESPIRATORY_TRACT | Status: DC | PRN

## 2022-08-21 MED ORDER — FOLIC ACID 1 MG PO TABS
1.0000 mg | ORAL_TABLET | Freq: Every day | ORAL | Status: DC
  Filled 2022-08-21 (×2): qty 1

## 2022-08-21 MED ORDER — HYDRALAZINE HCL 20 MG/ML IJ SOLN: 5.0000 mg | Freq: Four times a day (QID) | INTRAMUSCULAR | Status: DC | PRN

## 2022-08-21 MED ORDER — DEXTROSE-NACL 5-0.9 % IV SOLN
1000.0000 mL | Freq: Once | INTRAVENOUS | Status: AC
  Administered 2022-08-21: 1000 mL via INTRAVENOUS

## 2022-08-21 MED ORDER — LORAZEPAM 2 MG/ML IJ SOLN
0.0000 mg | INTRAMUSCULAR | Status: DC
  Administered 2022-08-21 – 2022-08-22 (×3): 2 mg via INTRAVENOUS
  Filled 2022-08-21: qty 2
  Filled 2022-08-21 (×2): qty 1

## 2022-08-21 MED ORDER — ONDANSETRON HCL 4 MG/2ML IJ SOLN: 4.0000 mg | Freq: Four times a day (QID) | INTRAMUSCULAR | Status: DC | PRN

## 2022-08-21 MED ORDER — CHLORDIAZEPOXIDE HCL 25 MG PO CAPS: 25.0000 mg | ORAL_CAPSULE | Freq: Every day | ORAL | Status: DC

## 2022-08-21 MED ORDER — HEPARIN SODIUM (PORCINE) 5000 UNIT/ML IJ SOLN
5000.0000 [IU] | Freq: Three times a day (TID) | INTRAMUSCULAR | Status: DC
  Administered 2022-08-22 (×2): 5000 [IU] via SUBCUTANEOUS
  Filled 2022-08-21 (×2): qty 1

## 2022-08-21 MED ORDER — LORAZEPAM 2 MG/ML IJ SOLN
1.0000 mg | INTRAMUSCULAR | Status: DC | PRN
  Filled 2022-08-21: qty 1

## 2022-08-21 MED ORDER — ACETAMINOPHEN 650 MG RE SUPP: 650.0000 mg | Freq: Four times a day (QID) | RECTAL | Status: DC | PRN

## 2022-08-21 MED ORDER — ACETAMINOPHEN 325 MG PO TABS: 650.0000 mg | ORAL_TABLET | Freq: Four times a day (QID) | ORAL | Status: DC | PRN

## 2022-08-21 MED ORDER — PANTOPRAZOLE SODIUM 40 MG PO TBEC
40.0000 mg | DELAYED_RELEASE_TABLET | Freq: Every day | ORAL | Status: DC
  Filled 2022-08-21: qty 1

## 2022-08-21 MED ORDER — SODIUM CHLORIDE 0.9 % IV SOLN
INTRAVENOUS | Status: DC
  Administered 2022-08-22: 100 mL/h via INTRAVENOUS

## 2022-08-21 MED ORDER — LACTATED RINGERS IV BOLUS
1000.0000 mL | Freq: Once | INTRAVENOUS | Status: AC
  Administered 2022-08-21: 1000 mL via INTRAVENOUS

## 2022-08-21 MED ORDER — INSULIN ASPART 100 UNIT/ML IJ SOLN: 0.0000 [IU] | Freq: Three times a day (TID) | INTRAMUSCULAR | Status: DC

## 2022-08-21 MED ORDER — ADULT MULTIVITAMIN W/MINERALS CH
1.0000 | ORAL_TABLET | Freq: Every day | ORAL | Status: DC
  Filled 2022-08-21 (×2): qty 1

## 2022-08-21 MED ORDER — DIAZEPAM 5 MG/ML IJ SOLN
10.0000 mg | Freq: Once | INTRAMUSCULAR | Status: AC
  Administered 2022-08-21: 10 mg via INTRAVENOUS
  Filled 2022-08-21: qty 2

## 2022-08-21 MED ORDER — ONDANSETRON HCL 4 MG PO TABS: 4.0000 mg | ORAL_TABLET | Freq: Four times a day (QID) | ORAL | Status: DC | PRN

## 2022-08-21 MED ORDER — INSULIN ASPART 100 UNIT/ML IJ SOLN
0.0000 [IU] | Freq: Every day | INTRAMUSCULAR | Status: DC
  Filled 2022-08-21: qty 1

## 2022-08-21 MED ORDER — THIAMINE HCL 100 MG/ML IJ SOLN
100.0000 mg | Freq: Once | INTRAMUSCULAR | Status: AC
  Administered 2022-08-21: 100 mg via INTRAVENOUS
  Filled 2022-08-21: qty 2

## 2022-08-21 NOTE — ED Triage Notes (Signed)
Pt to ED via [facility] from home for weakness and not acting himself. Per EMS family reports that pt has been sick since [place]. Today, pt slid into the floor from his chair. Pt is very shaky. Pts wife and neighbor reports that pt is a heavy drinker. Pt does not drink everyday but when he does drink he is a "heavy drinker". Pt is A & O x 2

## 2022-08-21 NOTE — H&P (Signed)
History and Physical    Patient: Derrick Jones Derrick Jones DOB: June 20, 1946 DOA: 08/21/2022 DOS: the patient was seen and examined on 08/21/2022 PCP: Cletis Athens, MD  Patient coming from: Home  Chief Complaint:  Chief Complaint  Patient presents with   Altered Mental Status   HPI: Derrick Jones is a 77 y.o. male with medical history significant of Hypertension, Dm2, Hx of ETOH dep in remission presents with altered mental status starting this am.  Pt was sleeping on couch last nite and found on floor this am.  Pt appeared confused according to his wife.  Wife states that he has not drank etoh in 1 month.  Pt states drinks 6 pack per day.  But appears somewhat confused.  Axox1.  (Person).  Pt denies headache, vision change, hearing change, n/v, abd pain, diarrhea, brbpr, black stool, dysuria, hematuria  In ED, 98.1, P 123, Bp 124/87  pox 97% on RA   Wbc 9.3, Hgb 15.0, Plt 152  Na 141, K 3.7 Bun 50, Creatinine 1.95 Ast 83, Alt 59 Alk phos 66, T. Bili 3.1  Blood culture x2 Lactic acid 5.2 (due to metformin) Ph 7.3,   Pt will be admitted for AMS likely secondary to decrease po intake leading to ARF, leading to lactic acidosis secondary to metformin use in the setting of renal failure.   Review of Systems:  Past Medical History:  Diagnosis Date   Diabetes mellitus without complication ([place])    GERD (gastroesophageal reflux disease)    Hypertension    Past Surgical History:  Procedure Laterality Date   COLONOSCOPY WITH PROPOFOL N/A 07/21/2019   Procedure: COLONOSCOPY WITH PROPOFOL;  Surgeon: Jonathon Bellows, MD;  Location: [facility] ENDOSCOPY;  Service: Gastroenterology;  Laterality: N/A;   ESOPHAGOGASTRODUODENOSCOPY (EGD) WITH PROPOFOL N/A 03/11/2020   Procedure: ESOPHAGOGASTRODUODENOSCOPY (EGD) WITH PROPOFOL;  Surgeon: Jonathon Bellows, MD;  Location: [facility] ENDOSCOPY;  Service: Gastroenterology;  Laterality: N/A;   ESOPHAGOGASTRODUODENOSCOPY (EGD) WITH PROPOFOL N/A 04/11/2020   Procedure:  ESOPHAGOGASTRODUODENOSCOPY (EGD) WITH PROPOFOL;  Surgeon: Jonathon Bellows, MD;  Location: [facility] ENDOSCOPY;  Service: Gastroenterology;  Laterality: N/A;   ESOPHAGOGASTRODUODENOSCOPY (EGD) WITH PROPOFOL N/A 07/29/2020   Procedure: ESOPHAGOGASTRODUODENOSCOPY (EGD) WITH PROPOFOL;  Surgeon: Jonathon Bellows, MD;  Location: [facility] ENDOSCOPY;  Service: Gastroenterology;  Laterality: N/A;   EYE SURGERY     FEMUR FRACTURE SURGERY Left    FRACTURE SURGERY     INTRAMEDULLARY (IM) NAIL INTERTROCHANTERIC Left 05/03/2020   Procedure: INTRAMEDULLARY (IM) NAIL INTERTROCHANTRIC;  Surgeon: Leim Fabry, MD;  Location: [facility] ORS;  Service: Orthopedics;  Laterality: Left;   Social History:  reports that he has been smoking cigarettes. He has a 53.00 pack-year smoking history. He has never used smokeless tobacco. He reports current alcohol use. He reports that he does not use drugs.  No Known Allergies  Family History  Problem Relation Age of Onset   Breast cancer Sister    Ovarian cancer Sister    Brain cancer Daughter     Prior to Admission medications   Medication Sig Start Date End Date Taking? Authorizing Provider  albuterol (VENTOLIN HFA) 108 (90 Base) MCG/ACT inhaler Inhale 2 puffs into the lungs every 6 (six) hours as needed for wheezing or shortness of breath. 11/17/21   Cletis Athens, MD  albuterol (VENTOLIN HFA) 108 (90 Base) MCG/ACT inhaler Inhale 2 puffs into the lungs every 4 (four) hours as needed. 05/04/22   Cletis Athens, MD  atenolol (TENORMIN) 100 MG tablet Take 1 tablet (100 mg total) by mouth  daily. 04/30/22   Cletis Athens, MD  citalopram (CELEXA) 10 MG tablet Take 1 tablet (10 mg total) by mouth daily. 07/08/21   Cletis Athens, MD  fluticasone (FLOVENT HFA) 110 MCG/ACT inhaler Inhale 1 puff into the lungs 2 (two) times daily. 02/05/21   Loletha Grayer, MD  folic acid (FOLVITE) 1 MG tablet Take 1 tablet (1 mg total) by mouth daily. 02/06/21   Loletha Grayer, MD  glucose blood (ONETOUCH VERIO) test  strip Use as instructed 05/08/21   Cletis Athens, MD  glucose blood test strip Check blood sugar 2 times daily 05/06/21   Cletis Athens, MD  LORazepam (ATIVAN) 0.5 MG tablet One tab po three times a day day1; one tab po twice a day day2,3; one tab po daily day 4,5,6 02/05/21   Loletha Grayer, MD  metFORMIN (GLUCOPHAGE) 500 MG tablet Take 1 tablet (500 mg total) by mouth daily. 12/02/20   Cletis Athens, MD  Multiple Vitamin (MULTIVITAMIN WITH MINERALS) TABS tablet Take 1 tablet by mouth daily. 02/06/21   Loletha Grayer, MD  omeprazole (PRILOSEC) 20 MG capsule Take 1 capsule (20 mg total) by mouth daily. 03/04/21   Cletis Athens, MD  tamsulosin (FLOMAX) 0.4 MG CAPS capsule Take 1 capsule (0.4 mg total) by mouth daily after supper. 02/05/21   Loletha Grayer, MD  thiamine 100 MG tablet Take 1 tablet (100 mg total) by mouth daily. 02/06/21   Loletha Grayer, MD    Physical Exam: Vitals:   08/21/22 2000 08/21/22 2030 08/21/22 2100 08/21/22 2130  BP: (!) 134/90 (!) 132/119 (!) 144/117 130/76  Pulse: (!) 113 (!) 117 (!) 117 (!) 116  Resp: 11 (!) 23 (!) 27 (!) 26  Temp:      TempSrc:      SpO2: 97% 95% 97% 94%   Heent: anicteric, pupils 1.78m symmetric, direct, consensual, near intact Mucous membranes dry  Neck: no jvd Heart: tachy s1, s2, no m/g/r Lung: ctab Abd: soft, nt, nd, +bs Ext: no c/c/e Skin: no palmar erythema Lymph: no adenopathy Neuro: cn2-12 intact, reflexes 2+ symmetric, diffuse with no clonus, motor 5/5 in all 4 ext  Data Reviewed:   Assessment and Plan: AMS likely due to lactic acidosis in the setting of ARF and metformin use vs ETOH withdrawal Check MRI brain  Check b12, esr, tsh, rpr, ana Monitor  ARF Check Urine sodium, urine creatinine, urine eosinophils Check renal ultrasound  Hydrate with ns iv Check cmp in am  Abnormal liver function  Check acute hepatitis panel Check RUQ ultrasound  ? ETOH withdrawal Librium '50mg'$  po qday x 1 day then '25mg'$  po qday x 1  day CIWA ativan iv  Weight loss Consider CT scan chest/ abd/ pelvis  Once creatinine improved  Dm2 Fsbs ac and qhs, ISS  Hypertension  Cont Atenolol '100mg'$  po qday  Tachycardia Check TSH check cardiac echo if persistent r/o Alcoholic cardiomyopathy  Elevated Trop Check cardiac echo as above  DVT prophylaxis: heparin Derrick Jones FULL CODE Dispo: home, lives with wife  Pt will be admitted inpatient >2 nites stay for ARF, lactic acidosis, abnormal liver function, dehydration    Advance Care Planning:   Code Status: Prior FULL CODE  Consults: none  Family Communication: w wife  Severity of Illness: The appropriate patient status for this patient is INPATIENT. Inpatient status is judged to be reasonable and necessary in order to provide the required intensity of service to ensure the patient's safety. The patient's presenting symptoms, physical exam findings, and initial radiographic  and laboratory data in the context of their chronic comorbidities is felt to place them at high risk for further clinical deterioration. Furthermore, it is not anticipated that the patient will be medically stable for discharge from the hospital within 2 midnights of admission.   * I certify that at the point of admission it is my clinical judgment that the patient will require inpatient hospital care spanning beyond 2 midnights from the point of admission due to high intensity of service, high risk for further deterioration and high frequency of surveillance required.*  Author: Jani Gravel, MD 08/21/2022 9:50 PM  For on call review [email or web address].

## 2022-08-21 NOTE — ED Notes (Signed)
Starleen Blue, MD, made aware of lactic acid 5.2

## 2022-08-21 NOTE — ED Provider Notes (Addendum)
[facility] Provider Note    Event Date/Time   First MD Initiated Contact with Patient 08/21/22 1643     (approximate)   History   Altered Mental Status   HPI  Derrick Jones is a 77 y.o. male past medical history of alcohol use disorder, diabetes GERD hypertension who presents with altered mental status.  Patient is accompanied by his wife and grandson who provide most of the history.  This morning she found him lying on the ground and he was tremulous and confused.  Otherwise yesterday was okay.  Patient she says drinks only about once a week on the weekends or drink about a 12 pack but she does not think he drinks every day.  States his last drink was about 1 week ago and she does not think she is having alcohol withdrawal.  Has not had any specific complaints.     Past Medical History:  Diagnosis Date   Diabetes mellitus without complication ([place])    GERD (gastroesophageal reflux disease)    Hypertension     Patient Active Problem List   Diagnosis Date Noted   Venous thrombosis 05/06/2021   Acute urinary retention    Type 2 diabetes mellitus with hyperlipidemia ([place])    Alcohol withdrawal ([place]) 02/03/2021   Suicidal ideation 02/03/2021   Hyponatremia 02/03/2021   Closed left hip fracture ([place]) 05/03/2020   Intertrochanteric fracture of left femur ([place]) 05/02/2020   Leukocytosis 05/02/2020   Fall 05/02/2020   COVID-19 virus detected 05/02/2020   Intertrochanteric fracture of left hip ([place]) 05/02/2020   Closed displaced intertrochanteric fracture of left femur ([place]) 05/02/2020   Alcohol use with withdrawal delirium ([place]) 05/02/2020   Alcohol abuse 02/27/2020   Concussion wth loss of consciousness of 30 minutes or less 25/95/6387   Alcoholic ketoacidosis 56/43/3295   History of alcohol abuse 08/02/2017   Hypercholesterolemia 08/02/2017   Heartburn 07/02/2017   Prediabetes 05/27/2017   Adjustment disorder with mixed anxiety and depressed mood  05/11/2017   Anxiety 05/11/2017   Essential hypertension 05/11/2017   Insomnia 05/11/2017   History of gastric ulcer 05/11/2017   History of esophageal stricture 05/11/2017     Physical Exam  Triage Vital Signs: ED Triage Vitals  Enc Vitals Group     BP --      Pulse Rate 08/21/22 1636 (!) 123     Resp 08/21/22 1633 (!) 25     Temp 08/21/22 1636 98.1 F (36.7 C)     Temp Source 08/21/22 1636 Oral     SpO2 08/21/22 1627 100 %     Weight --      Height --      Head Circumference --      Peak Flow --      Pain Score 08/21/22 1634 0     Pain Loc --      Pain Edu? --      Excl. in [place]? --     Most recent vital signs: Vitals:   08/21/22 1907 08/21/22 1909  BP: (!) 153/99   Pulse: (!) 107   Resp: 15   Temp:  97.6 F (36.4 C)  SpO2: 95%      General: Awake, cachectic tremulous chronically ill-appearing CV:  Good peripheral perfusion.  No edema Resp:  Normal effort.  Abd:  No distention.  Neuro:             Awake, Alert, Oriented x 1-tells me his name does not know where  he is or the date Other:  Patient is extremely tremulous Dry mucous membranes   ED Results / Procedures / Treatments  Labs (all labs ordered are listed, but only abnormal results are displayed) Labs Reviewed  COMPREHENSIVE METABOLIC PANEL - Abnormal; Notable for the following components:      Result Value   Chloride 96 (*)    CO2 14 (*)    Glucose, Bld 104 (*)    BUN 50 (*)    Creatinine, Ser 1.95 (*)    AST 83 (*)    ALT 59 (*)    Total Bilirubin 3.1 (*)    GFR, Estimated 35 (*)    Anion gap 31 (*)    All other components within normal limits  LACTIC ACID, PLASMA - Abnormal; Notable for the following components:   Lactic Acid, Venous 5.2 (*)    All other components within normal limits  LACTIC ACID, PLASMA - Abnormal; Notable for the following components:   Lactic Acid, Venous 2.4 (*)    All other components within normal limits  BLOOD GAS, VENOUS - Abnormal; Notable for the following  components:   Acid-base deficit 4.8 (*)    All other components within normal limits  SALICYLATE LEVEL - Abnormal; Notable for the following components:   Salicylate Lvl <8.6 (*)    All other components within normal limits  ACETAMINOPHEN LEVEL - Abnormal; Notable for the following components:   Acetaminophen (Tylenol), Serum <10 (*)    All other components within normal limits  CULTURE, BLOOD (ROUTINE X 2)  CULTURE, BLOOD (ROUTINE X 2)  CBC WITH DIFFERENTIAL/PLATELET  PROTIME-INR  AMMONIA  ETHANOL  URINALYSIS, ROUTINE W REFLEX MICROSCOPIC  MAGNESIUM  CK  BETA-HYDROXYBUTYRIC ACID     EKG  EKG reviewed interpreted myself shows sinus tachycardia normal axis normal intervals no acute ischemic changes   RADIOLOGY I reviewed and interpreted the CXR which does not show any acute cardiopulmonary process    PROCEDURES:  Critical Care performed: Yes, see critical care procedure note(s)  .Critical Care  Performed by: Rada Hay, MD Authorized by: Rada Hay, MD   Critical care provider statement:    Critical care time (minutes):  30   Critical care was time spent personally by me on the following activities:  Development of treatment plan with patient or surrogate, discussions with consultants, evaluation of patient's response to treatment, examination of patient, ordering and review of laboratory studies, ordering and review of radiographic studies, ordering and performing treatments and interventions, pulse oximetry, re-evaluation of patient's condition and review of old charts .1-3 Lead EKG Interpretation  Performed by: Rada Hay, MD Authorized by: Rada Hay, MD     Interpretation: abnormal     ECG rate assessment: tachycardic     Rhythm: sinus tachycardia     Ectopy: none     Conduction: normal     The patient is on the cardiac monitor to evaluate for evidence of arrhythmia and/or significant heart rate changes.   MEDICATIONS  ORDERED IN ED: Medications  dextrose 5 %-0.9 % sodium chloride infusion (has no administration in time range)  lactated ringers bolus 1,000 mL (0 mLs Intravenous Stopped 08/21/22 1907)  lactated ringers bolus 1,000 mL (0 mLs Intravenous Stopped 08/21/22 1907)  thiamine (VITAMIN B1) injection 100 mg (100 mg Intravenous Given 08/21/22 1736)  diazepam (VALIUM) injection 10 mg (10 mg Intravenous Given 08/21/22 1733)  diazepam (VALIUM) injection 20 mg (20 mg Intravenous Given 08/21/22 1902)  IMPRESSION / MDM / ASSESSMENT AND PLAN / ED COURSE  I reviewed the triage vital signs and the nursing notes.                              Patient's presentation is most consistent with acute presentation with potential threat to life or bodily function.  Differential diagnosis includes, but is not limited to, severe alcohol withdrawal, DTs, metabolic encephalopathy, intracranial hemorrhage, CVA, hyponatremia, hypomagnesemia  Patient is a 77 year old male with prior admission for alcohol withdrawal who presents because of altered mental status.  Patient is tachycardic tachypneic with normal blood pressure.  He is cachectic and extremely tremulous.  He is oriented to person only.  Wife tells me he drinks only about once a week I suspect that this is probably an underestimate.  She thinks his last drink was about a week ago.  She is denying any history of alcohol withdrawal however he was admitted last in 2022 for alcohol withdrawal.  Patient has a nonfocal neurologic exam is able to raise both arms and legs symmetrically.  No nystagmus.  He does look quite dry.  Labs are notable for elevated lactate to 5.2 anion gap acidosis bicarb 12 anion gap 31.  Potassium is okay will send magnesium.  He has no leukocytosis.  Suspect lactate is in the setting of alcohol withdraw and AKA.  Will give thiamine IV Valium and 2 L of fluid.  After thiamine will give dextrose.  Patient will require admission.  Patient's heart rate  improved after IV Valium and fluids but still tachycardic and still quite tremulous and given second dose of 20 mg of Valium.  On reassessment he is much more sedated so we will hold off on any additional benzos.  This response to the Valium is the question whether this is all alcohol withdrawal.  His blood gas is overall reassuring pH 7 3 bicarb 21.  His lactate is downtrending to 2.4.  CT head is negative for acute abnormality.  I think that the lactic acidosis is not representative of sepsis and think this is more likely to be alcohol related.  Have sent cultures but will defer antibiotics at this time.     FINAL CLINICAL IMPRESSION(S) / ED DIAGNOSES   Final diagnoses:  Altered mental status, unspecified altered mental status type  Lactic acidosis  Alcohol withdrawal syndrome with complication ([place])     Rx / DC Orders   ED Discharge Orders     None        Note:  This document was prepared using Dragon voice recognition software and may include unintentional dictation errors.   Rada Hay, MD 08/21/22 1934    Rada Hay, MD 08/21/22 1950

## 2022-08-21 NOTE — ED Notes (Addendum)
Sallyanne Kuster (neighbor) can be reached at [phone]. He can contact the pt's wife if needed.

## 2022-08-22 ENCOUNTER — Encounter (HOSPITAL_COMMUNITY): Payer: Self-pay | Admitting: Obstetrics and Gynecology

## 2022-08-22 ENCOUNTER — Inpatient Hospital Stay (HOSPITAL_COMMUNITY): Payer: PPO

## 2022-08-22 ENCOUNTER — Encounter (HOSPITAL_COMMUNITY): Payer: Self-pay

## 2022-08-22 ENCOUNTER — Inpatient Hospital Stay (HOSPITAL_COMMUNITY)
Admission: RE | Admit: 2022-08-22 | Discharge: 2022-09-17 | DRG: 640 | Disposition: E | Payer: PPO | Attending: Critical Care Medicine | Admitting: Critical Care Medicine

## 2022-08-22 ENCOUNTER — Inpatient Hospital Stay
Admit: 2022-08-22 | Discharge: 2022-08-22 | Disposition: A | Discharge status: Home / Self Care | Payer: PPO | Attending: Internal Medicine | Admitting: Internal Medicine

## 2022-08-22 DIAGNOSIS — E43 Unspecified severe protein-calorie malnutrition: Secondary | ICD-10-CM | POA: Diagnosis not present

## 2022-08-22 DIAGNOSIS — Z515 Encounter for palliative care: Secondary | ICD-10-CM

## 2022-08-22 DIAGNOSIS — K222 Esophageal obstruction: Secondary | ICD-10-CM | POA: Diagnosis not present

## 2022-08-22 DIAGNOSIS — M96A2 Fracture of one rib associated with chest compression and cardiopulmonary resuscitation: Secondary | ICD-10-CM | POA: Diagnosis not present

## 2022-08-22 DIAGNOSIS — K760 Fatty (change of) liver, not elsewhere classified: Secondary | ICD-10-CM | POA: Diagnosis not present

## 2022-08-22 DIAGNOSIS — R579 Shock, unspecified: Secondary | ICD-10-CM | POA: Diagnosis not present

## 2022-08-22 DIAGNOSIS — Z1152 Encounter for screening for COVID-19: Secondary | ICD-10-CM | POA: Diagnosis not present

## 2022-08-22 DIAGNOSIS — R570 Cardiogenic shock: Secondary | ICD-10-CM | POA: Diagnosis not present

## 2022-08-22 DIAGNOSIS — J9601 Acute respiratory failure with hypoxia: Secondary | ICD-10-CM | POA: Diagnosis not present

## 2022-08-22 DIAGNOSIS — S270XXA Traumatic pneumothorax, initial encounter: Secondary | ICD-10-CM | POA: Diagnosis not present

## 2022-08-22 DIAGNOSIS — Z79899 Other long term (current) drug therapy: Secondary | ICD-10-CM

## 2022-08-22 DIAGNOSIS — F10131 Alcohol abuse with withdrawal delirium: Secondary | ICD-10-CM | POA: Diagnosis not present

## 2022-08-22 DIAGNOSIS — R627 Adult failure to thrive: Secondary | ICD-10-CM | POA: Diagnosis present

## 2022-08-22 DIAGNOSIS — I469 Cardiac arrest, cause unspecified: Secondary | ICD-10-CM | POA: Diagnosis not present

## 2022-08-22 DIAGNOSIS — N179 Acute kidney failure, unspecified: Secondary | ICD-10-CM | POA: Diagnosis present

## 2022-08-22 DIAGNOSIS — E7800 Pure hypercholesterolemia, unspecified: Secondary | ICD-10-CM | POA: Diagnosis present

## 2022-08-22 DIAGNOSIS — F4323 Adjustment disorder with mixed anxiety and depressed mood: Secondary | ICD-10-CM | POA: Diagnosis not present

## 2022-08-22 DIAGNOSIS — J939 Pneumothorax, unspecified: Secondary | ICD-10-CM | POA: Diagnosis not present

## 2022-08-22 DIAGNOSIS — E512 Wernicke's encephalopathy: Principal | ICD-10-CM | POA: Diagnosis present

## 2022-08-22 DIAGNOSIS — R7401 Elevation of levels of liver transaminase levels: Secondary | ICD-10-CM | POA: Diagnosis present

## 2022-08-22 DIAGNOSIS — E876 Hypokalemia: Secondary | ICD-10-CM | POA: Diagnosis present

## 2022-08-22 DIAGNOSIS — Z8711 Personal history of peptic ulcer disease: Secondary | ICD-10-CM | POA: Diagnosis not present

## 2022-08-22 DIAGNOSIS — G40509 Epileptic seizures related to external causes, not intractable, without status epilepticus: Secondary | ICD-10-CM | POA: Diagnosis not present

## 2022-08-22 DIAGNOSIS — Z808 Family history of malignant neoplasm of other organs or systems: Secondary | ICD-10-CM

## 2022-08-22 DIAGNOSIS — F1010 Alcohol abuse, uncomplicated: Secondary | ICD-10-CM | POA: Diagnosis not present

## 2022-08-22 DIAGNOSIS — F17210 Nicotine dependence, cigarettes, uncomplicated: Secondary | ICD-10-CM | POA: Diagnosis present

## 2022-08-22 DIAGNOSIS — Z681 Body mass index (BMI) 19 or less, adult: Secondary | ICD-10-CM

## 2022-08-22 DIAGNOSIS — K219 Gastro-esophageal reflux disease without esophagitis: Secondary | ICD-10-CM | POA: Diagnosis present

## 2022-08-22 DIAGNOSIS — Z8041 Family history of malignant neoplasm of ovary: Secondary | ICD-10-CM

## 2022-08-22 DIAGNOSIS — E1165 Type 2 diabetes mellitus with hyperglycemia: Secondary | ICD-10-CM | POA: Diagnosis present

## 2022-08-22 DIAGNOSIS — R531 Weakness: Secondary | ICD-10-CM | POA: Diagnosis not present

## 2022-08-22 DIAGNOSIS — D696 Thrombocytopenia, unspecified: Secondary | ICD-10-CM | POA: Diagnosis not present

## 2022-08-22 DIAGNOSIS — E8889 Other specified metabolic disorders: Secondary | ICD-10-CM | POA: Diagnosis not present

## 2022-08-22 DIAGNOSIS — Z803 Family history of malignant neoplasm of breast: Secondary | ICD-10-CM

## 2022-08-22 DIAGNOSIS — R4182 Altered mental status, unspecified: Secondary | ICD-10-CM | POA: Diagnosis not present

## 2022-08-22 DIAGNOSIS — I10 Essential (primary) hypertension: Secondary | ICD-10-CM | POA: Diagnosis present

## 2022-08-22 DIAGNOSIS — F1011 Alcohol abuse, in remission: Secondary | ICD-10-CM | POA: Diagnosis present

## 2022-08-22 DIAGNOSIS — G9340 Encephalopathy, unspecified: Secondary | ICD-10-CM

## 2022-08-22 DIAGNOSIS — R12 Heartburn: Secondary | ICD-10-CM | POA: Diagnosis not present

## 2022-08-22 DIAGNOSIS — F419 Anxiety disorder, unspecified: Secondary | ICD-10-CM

## 2022-08-22 DIAGNOSIS — Z66 Do not resuscitate: Secondary | ICD-10-CM | POA: Diagnosis not present

## 2022-08-22 DIAGNOSIS — R569 Unspecified convulsions: Secondary | ICD-10-CM | POA: Diagnosis not present

## 2022-08-22 LAB — TSH: TSH: 1.606 u[IU]/mL (ref 0.350–4.500)

## 2022-08-22 LAB — RPR: RPR Ser Ql: NONREACTIVE

## 2022-08-22 LAB — COMPREHENSIVE METABOLIC PANEL
ALT: 59 U/L — ABNORMAL HIGH (ref 0–44)
ALT: 47 U/L — ABNORMAL HIGH (ref 0–44)
AST: 83 U/L — ABNORMAL HIGH (ref 15–41)
AST: 56 U/L — ABNORMAL HIGH (ref 15–41)
Albumin: 4.6 g/dL (ref 3.5–5.0)
Albumin: 3.5 g/dL (ref 3.5–5.0)
Alkaline Phosphatase: 66 U/L (ref 38–126)
Alkaline Phosphatase: 47 U/L (ref 38–126)
Anion gap: 31 — ABNORMAL HIGH (ref 5–15)
Anion gap: 12 (ref 5–15)
BUN: 50 mg/dL — ABNORMAL HIGH (ref 8–23)
BUN: 28 mg/dL — ABNORMAL HIGH (ref 8–23)
CO2: 14 mmol/L — ABNORMAL LOW (ref 22–32)
CO2: 29 mmol/L (ref 22–32)
Calcium: 9.5 mg/dL (ref 8.9–10.3)
Calcium: 8.5 mg/dL — ABNORMAL LOW (ref 8.9–10.3)
Chloride: 96 mmol/L — ABNORMAL LOW (ref 98–111)
Chloride: 105 mmol/L (ref 98–111)
Creatinine, Ser: 1.95 mg/dL — ABNORMAL HIGH (ref 0.61–1.24)
Creatinine, Ser: 0.87 mg/dL (ref 0.61–1.24)
GFR, Estimated: 35 mL/min — ABNORMAL LOW (ref >60)
GFR, Estimated: >60 mL/min (ref >60)
Glucose, Bld: 104 mg/dL — ABNORMAL HIGH (ref 70–99)
Glucose, Bld: 97 mg/dL (ref 70–99)
Potassium: 3.7 mmol/L (ref 3.5–5.1)
Potassium: 3.1 mmol/L — ABNORMAL LOW (ref 3.5–5.1)
Sodium: 141 mmol/L (ref 135–145)
Sodium: 146 mmol/L — ABNORMAL HIGH (ref 135–145)
Total Bilirubin: 3.1 mg/dL — ABNORMAL HIGH (ref 0.3–1.2)
Total Bilirubin: 2 mg/dL — ABNORMAL HIGH (ref 0.3–1.2)
Total Protein: 8 g/dL (ref 6.5–8.1)
Total Protein: 6.2 g/dL — ABNORMAL LOW (ref 6.5–8.1)

## 2022-08-22 LAB — VITAMIN B12: Vitamin B-12: 528 pg/mL (ref 180–914)

## 2022-08-22 LAB — CBC
HCT: 37.6 % — ABNORMAL LOW (ref 39.0–52.0)
Hemoglobin: 12.8 g/dL — ABNORMAL LOW (ref 13.0–17.0)
MCH: 31.8 pg (ref 26.0–34.0)
MCHC: 34 g/dL (ref 30.0–36.0)
MCV: 93.5 fL (ref 80.0–100.0)
Platelets: 108 10*3/uL — ABNORMAL LOW (ref 150–400)
RBC: 4.02 MIL/uL — ABNORMAL LOW (ref 4.22–5.81)
RDW: 14.7 % (ref 11.5–15.5)
WBC: 7.8 10*3/uL (ref 4.0–10.5)
nRBC: 0 % (ref 0.0–0.2)

## 2022-08-22 LAB — URINALYSIS, ROUTINE W REFLEX MICROSCOPIC
Bilirubin Urine: NEGATIVE
Glucose, UA: >=500 mg/dL — AB
Hgb urine dipstick: NEGATIVE
Ketones, ur: 20 mg/dL — AB
Leukocytes,Ua: NEGATIVE
Nitrite: NEGATIVE
Protein, ur: 30 mg/dL — AB
Specific Gravity, Urine: 1.013 (ref 1.005–1.030)
pH: 5 (ref 5.0–8.0)

## 2022-08-22 LAB — PROTIME-INR
INR: 1 (ref 0.8–1.2)
Prothrombin Time: 12.6 seconds (ref 11.4–15.2)

## 2022-08-22 LAB — HEPATITIS PANEL, ACUTE
HCV Ab: NONREACTIVE
Hep A IgM: NONREACTIVE
Hep B C IgM: NONREACTIVE
Hepatitis B Surface Ag: NONREACTIVE

## 2022-08-22 LAB — GLUCOSE, CAPILLARY
Glucose-Capillary: 102 mg/dL — ABNORMAL HIGH (ref 70–99)
Glucose-Capillary: 116 mg/dL — ABNORMAL HIGH (ref 70–99)
Glucose-Capillary: 98 mg/dL (ref 70–99)
Glucose-Capillary: 115 mg/dL — ABNORMAL HIGH (ref 70–99)

## 2022-08-22 LAB — HIV ANTIBODY (ROUTINE TESTING W REFLEX): HIV Screen 4th Generation wRfx: NONREACTIVE

## 2022-08-22 LAB — SEDIMENTATION RATE: Sed Rate: 5 mm/hr (ref 0–20)

## 2022-08-22 LAB — MAGNESIUM: Magnesium: 1.8 mg/dL (ref 1.7–2.4)

## 2022-08-22 MED ORDER — THIAMINE HCL 100 MG/ML IJ SOLN
500.0000 mg | Freq: Three times a day (TID) | INTRAVENOUS | Status: DC
  Administered 2022-08-22 – 2022-08-23 (×4): 500 mg via INTRAVENOUS
  Filled 2022-08-22 (×5): qty 5

## 2022-08-22 MED ORDER — CITALOPRAM HYDROBROMIDE 20 MG PO TABS: 10.0000 mg | ORAL_TABLET | Freq: Every day | ORAL | Status: DC

## 2022-08-22 MED ORDER — ENOXAPARIN SODIUM 40 MG/0.4ML IJ SOSY: 40.0000 mg | PREFILLED_SYRINGE | INTRAMUSCULAR | Status: DC

## 2022-08-22 MED ORDER — ACETAMINOPHEN 325 MG PO TABS: 650.0000 mg | ORAL_TABLET | Freq: Four times a day (QID) | ORAL | Status: DC | PRN

## 2022-08-22 MED ORDER — LEVETIRACETAM IN NACL 500 MG/100ML IV SOLN
500.0000 mg | Freq: Two times a day (BID) | INTRAVENOUS | Status: DC
  Administered 2022-08-22 – 2022-08-23 (×2): 500 mg via INTRAVENOUS
  Filled 2022-08-22 (×2): qty 100

## 2022-08-22 MED ORDER — ENOXAPARIN SODIUM 40 MG/0.4ML IJ SOSY
40.0000 mg | PREFILLED_SYRINGE | INTRAMUSCULAR | Status: DC
  Administered 2022-08-22: 40 mg via SUBCUTANEOUS
  Filled 2022-08-22: qty 0.4

## 2022-08-22 MED ORDER — FOLIC ACID 1 MG PO TABS
1.0000 mg | ORAL_TABLET | Freq: Every day | ORAL | Status: DC
  Filled 2022-08-22: qty 1

## 2022-08-22 MED ORDER — SODIUM CHLORIDE 0.9% FLUSH
3.0000 mL | Freq: Two times a day (BID) | INTRAVENOUS | Status: DC
  Administered 2022-08-23 (×2): 3 mL via INTRAVENOUS

## 2022-08-22 MED ORDER — THIAMINE HCL 100 MG/ML IJ SOLN
500.0000 mg | Freq: Three times a day (TID) | INTRAVENOUS | Status: DC
  Administered 2022-08-22: 500 mg via INTRAVENOUS
  Filled 2022-08-22 (×3): qty 5

## 2022-08-22 MED ORDER — POTASSIUM CHLORIDE IN NACL 20-0.9 MEQ/L-% IV SOLN
INTRAVENOUS | Status: DC
  Filled 2022-08-22 (×2): qty 1000

## 2022-08-22 MED ORDER — LEVETIRACETAM IN NACL 500 MG/100ML IV SOLN
500.0000 mg | Freq: Two times a day (BID) | INTRAVENOUS | Status: DC
  Filled 2022-08-22: qty 100

## 2022-08-22 MED ORDER — ATENOLOL 25 MG PO TABS
100.0000 mg | ORAL_TABLET | Freq: Every day | ORAL | Status: DC
  Filled 2022-08-22: qty 4

## 2022-08-22 MED ORDER — TAMSULOSIN HCL 0.4 MG PO CAPS: 0.4000 mg | ORAL_CAPSULE | Freq: Every day | ORAL | Status: DC

## 2022-08-22 MED ORDER — INSULIN ASPART 100 UNIT/ML IJ SOLN
0.0000 [IU] | INTRAMUSCULAR | Status: DC
  Administered 2022-08-23 (×3): 1 [IU] via SUBCUTANEOUS

## 2022-08-22 MED ORDER — LORAZEPAM 1 MG PO TABS: 1.0000 mg | ORAL_TABLET | ORAL | Status: DC | PRN

## 2022-08-22 MED ORDER — ACETAMINOPHEN 650 MG RE SUPP: 650.0000 mg | Freq: Four times a day (QID) | RECTAL | Status: DC | PRN

## 2022-08-22 MED ORDER — POLYETHYLENE GLYCOL 3350 17 G PO PACK: 17.0000 g | PACK | Freq: Every day | ORAL | Status: DC | PRN

## 2022-08-22 MED ORDER — ALBUTEROL SULFATE (2.5 MG/3ML) 0.083% IN NEBU: 3.0000 mL | INHALATION_SOLUTION | Freq: Four times a day (QID) | RESPIRATORY_TRACT | Status: DC | PRN

## 2022-08-22 MED ORDER — ADULT MULTIVITAMIN W/MINERALS CH
1.0000 | ORAL_TABLET | Freq: Every day | ORAL | Status: DC
  Filled 2022-08-22: qty 1

## 2022-08-22 MED ORDER — PANTOPRAZOLE SODIUM 40 MG PO TBEC
40.0000 mg | DELAYED_RELEASE_TABLET | Freq: Every day | ORAL | Status: DC
  Filled 2022-08-22: qty 1

## 2022-08-22 MED ORDER — POTASSIUM CHLORIDE 20 MEQ PO PACK
20.0000 meq | PACK | Freq: Once | ORAL | Status: AC
  Administered 2022-08-22: 20 meq via ORAL
  Filled 2022-08-22: qty 1

## 2022-08-22 MED ORDER — CITALOPRAM HYDROBROMIDE 10 MG PO TABS
10.0000 mg | ORAL_TABLET | Freq: Every day | ORAL | Status: DC
  Filled 2022-08-22: qty 1

## 2022-08-22 MED ORDER — LEVETIRACETAM IN NACL 500 MG/100ML IV SOLN: 500.0000 mg | Freq: Two times a day (BID) | INTRAVENOUS | Status: AC

## 2022-08-22 NOTE — Progress Notes (Signed)
Patient arrived from [place] transport; notified MD of arrival; await orders.

## 2022-08-22 NOTE — Progress Notes (Signed)
Soft mittens applied due to pulling at tubes and wires.

## 2022-08-22 NOTE — Plan of Care (Signed)
LTM EEG ordered per Dr. Artemio Aly plan, please see her note for full details of presentation Neurology will follow along

## 2022-08-22 NOTE — H&P (Signed)
History and Physical   Derrick Jones FBP:[id number] DOB: 08-30-1945 DOA: 09/02/2022  PCP: Cletis Athens, MD   Patient coming from: Home  Chief Complaint: Altered mental status  HPI and initial hospital course: Derrick Jones is a 77 y.o. male with medical history significant of diabetes, hyperlipidemia, alcohol use, hypertension, GERD, PUD, anxiety, depression, esophageal stricture presenting with altered mental status.  Patient presented yesterday to [facility] with altered mental status.  Patient slept on the couch the night before and was found on the floor in the morning confused by his wife.  Reportedly he has had episodes that were suspicious for seizures in the past where he will become stiff and then go unconscious which will be followed by confusion.  This has been ongoing for some time but he has not seen a doctor for it and has no formal diagnosis of seizure.  He has had a history of alcohol use with withdrawal symptoms in the past but his wife states he has not had anything to drink in the past month.  Initially in the ED he reported that he had had 6 drinks a day even recently however he was confused at the time.  Patient has had fluctuating mentation while admitted for the past day or so between alert and oriented to self only and near obtundation.  His workup has included negative HIV, RPR, normal ESR, normal B12.  Urinalysis with bacteria only.  No leukocytosis.  Normal CT head and MRI brain.  Neurology was consulted and recommended transfer to [facility] for continuous EEG and IV Keppra.  Due to his alcohol history recommendation was also for Warnicke dose thiamine.  He has been monitored for withdrawal at [facility] and received IV fluids for treatment of AKI which has resolved.  Signs have remained stable with blood pressure in the 120s to 150s.  Heart rate initially elevated but is improved to the 80s to 90s.  Respiratory rate initially elevated in the 20s but now improved to the  teens.  Review of Systems: As per HPI otherwise all other systems reviewed and are negative.  Past Medical History:  Diagnosis Date   Alcohol abuse 02/27/2020   Alcohol use with withdrawal delirium ([place]) 82/42/3536   Alcoholic ketoacidosis 14/43/1540   ARF (acute renal failure) ([place]) 08/21/2022   Closed displaced intertrochanteric fracture of left femur ([place]) 05/02/2020   Diabetes mellitus without complication (HCC)    GERD (gastroesophageal reflux disease)    Hypertension    Hyponatremia 02/03/2021    Past Surgical History:  Procedure Laterality Date   COLONOSCOPY WITH PROPOFOL N/A 07/21/2019   Procedure: COLONOSCOPY WITH PROPOFOL;  Surgeon: Jonathon Bellows, MD;  Location: [facility] ENDOSCOPY;  Service: Gastroenterology;  Laterality: N/A;   ESOPHAGOGASTRODUODENOSCOPY (EGD) WITH PROPOFOL N/A 03/11/2020   Procedure: ESOPHAGOGASTRODUODENOSCOPY (EGD) WITH PROPOFOL;  Surgeon: Jonathon Bellows, MD;  Location: [facility] ENDOSCOPY;  Service: Gastroenterology;  Laterality: N/A;   ESOPHAGOGASTRODUODENOSCOPY (EGD) WITH PROPOFOL N/A 04/11/2020   Procedure: ESOPHAGOGASTRODUODENOSCOPY (EGD) WITH PROPOFOL;  Surgeon: Jonathon Bellows, MD;  Location: [facility] ENDOSCOPY;  Service: Gastroenterology;  Laterality: N/A;   ESOPHAGOGASTRODUODENOSCOPY (EGD) WITH PROPOFOL N/A 07/29/2020   Procedure: ESOPHAGOGASTRODUODENOSCOPY (EGD) WITH PROPOFOL;  Surgeon: Jonathon Bellows, MD;  Location: [facility] ENDOSCOPY;  Service: Gastroenterology;  Laterality: N/A;   EYE SURGERY     FEMUR FRACTURE SURGERY Left    FRACTURE SURGERY     INTRAMEDULLARY (IM) NAIL INTERTROCHANTERIC Left 05/03/2020   Procedure: INTRAMEDULLARY (IM) NAIL INTERTROCHANTRIC;  Surgeon: Leim Fabry, MD;  Location: [facility] ORS;  Service: Orthopedics;  Laterality: Left;    Social History  reports that he has been smoking cigarettes. He has a 53.00 pack-year smoking history. He has never used smokeless tobacco. He reports current alcohol use. He reports that he does not use drugs.  No Known  Allergies  Family History  Problem Relation Age of Onset   Breast cancer Sister    Ovarian cancer Sister    Brain cancer Daughter   Reviewed on admission  Prior to Admission medications   Medication Sig Start Date End Date Taking? Authorizing Provider  albuterol (VENTOLIN HFA) 108 (90 Base) MCG/ACT inhaler Inhale 2 puffs into the lungs every 6 (six) hours as needed for wheezing or shortness of breath. 11/17/21   Cletis Athens, MD  albuterol (VENTOLIN HFA) 108 (90 Base) MCG/ACT inhaler Inhale 2 puffs into the lungs every 4 (four) hours as needed. 05/04/22   Cletis Athens, MD  atenolol (TENORMIN) 100 MG tablet Take 1 tablet (100 mg total) by mouth daily. 04/30/22   Cletis Athens, MD  citalopram (CELEXA) 10 MG tablet Take 1 tablet (10 mg total) by mouth daily. 07/08/21   Cletis Athens, MD  fluticasone (FLOVENT HFA) 110 MCG/ACT inhaler Inhale 1 puff into the lungs 2 (two) times daily. 02/05/21   Loletha Grayer, MD  glucose blood (ONETOUCH VERIO) test strip Use as instructed 05/08/21   Cletis Athens, MD  glucose blood test strip Check blood sugar 2 times daily 05/06/21   Cletis Athens, MD  levETIRAcetam (KEPRRA) 500 MG/100ML SOLN Inject 100 mLs (500 mg total) into the vein every 12 (twelve) hours. 09/09/2022   Wouk, Ailene Rud, MD  Multiple Vitamin (MULTIVITAMIN WITH MINERALS) TABS tablet Take 1 tablet by mouth daily. 02/06/21   Loletha Grayer, MD  omeprazole (PRILOSEC) 20 MG capsule Take 1 capsule (20 mg total) by mouth daily. 03/04/21   Cletis Athens, MD  tamsulosin (FLOMAX) 0.4 MG CAPS capsule Take 1 capsule (0.4 mg total) by mouth daily after supper. 02/05/21   Loletha Grayer, MD  thiamine 100 MG tablet Take 1 tablet (100 mg total) by mouth daily. 02/06/21   Loletha Grayer, MD    Physical Exam: Vitals:   08/31/2022 1808  Height: '5\' 7"'$  (1.702 m)    Physical Exam Constitutional:      General: He is not in acute distress.    Appearance: Normal appearance.  HENT:     Head: Normocephalic and  atraumatic.     Mouth/Throat:     Mouth: Mucous membranes are moist.     Pharynx: Oropharynx is clear.  Eyes:     Extraocular Movements: Extraocular movements intact.     Pupils: Pupils are equal, round, and reactive to light.  Cardiovascular:     Rate and Rhythm: Normal rate and regular rhythm.     Pulses: Normal pulses.     Heart sounds: Normal heart sounds.  Pulmonary:     Effort: Pulmonary effort is normal. No respiratory distress.     Breath sounds: Normal breath sounds.  Abdominal:     General: Bowel sounds are normal. There is no distension.     Palpations: Abdomen is soft.     Tenderness: There is no abdominal tenderness.  Musculoskeletal:        General: No swelling or deformity.  Skin:    General: Skin is warm and dry.  Neurological:     General: No focal deficit present.     Comments: Tremulous Slurred speech EEG leads in place Alert, oriented to person,  possibly year but hard to tell with slurred speech.     Labs on Admission: I have personally reviewed following labs and imaging studies  CBC: Recent Labs  Lab 08/21/22 1629 09/08/2022 0627  WBC 9.3 7.8  NEUTROABS 6.9  --   HGB 15.0 12.8*  HCT 45.5 37.6*  MCV 95.8 93.5  PLT 152 108*    Basic Metabolic Panel: Recent Labs  Lab 08/21/22 1629 08/21/22 1846 08/21/2022 0619 08/19/2022 0627  NA 141  --   --  146*  K 3.7  --   --  3.1*  CL 96*  --   --  105  CO2 14*  --   --  29  GLUCOSE 104*  --   --  97  BUN 50*  --   --  28*  CREATININE 1.95*  --   --  0.87  CALCIUM 9.5  --   --  8.5*  MG  --  2.2 1.8  --     GFR: Estimated Creatinine Clearance: 53.2 mL/min (by C-G formula based on SCr of 0.87 mg/dL).  Liver Function Tests: Recent Labs  Lab 08/21/22 1629 08/26/2022 0627  AST 83* 56*  ALT 59* 47*  ALKPHOS 66 47  BILITOT 3.1* 2.0*  PROT 8.0 6.2*  ALBUMIN 4.6 3.5    Urine analysis:    Component Value Date/Time   COLORURINE YELLOW (A) 08/21/2022 2140   APPEARANCEUR CLEAR (A) 08/21/2022  2140   APPEARANCEUR Clear 02/08/2013 1549   LABSPEC 1.013 08/21/2022 2140   LABSPEC 1.015 02/08/2013 1549   PHURINE 5.0 08/21/2022 2140   GLUCOSEU >=500 (A) 08/21/2022 2140   GLUCOSEU Negative 02/08/2013 1549   HGBUR NEGATIVE 08/21/2022 2140   BILIRUBINUR NEGATIVE 08/21/2022 2140   BILIRUBINUR Negative 02/08/2013 1549   KETONESUR 20 (A) 08/21/2022 2140   PROTEINUR 30 (A) 08/21/2022 2140   NITRITE NEGATIVE 08/21/2022 2140   LEUKOCYTESUR NEGATIVE 08/21/2022 2140   LEUKOCYTESUR Negative 02/08/2013 1549    Radiological Exams on Admission: MR BRAIN WO CONTRAST  Result Date: 08/21/2022 CLINICAL DATA:  Weakness and altered mental status EXAM: MRI HEAD WITHOUT CONTRAST TECHNIQUE: Multiplanar, multiecho pulse sequences of the brain and surrounding structures were obtained without intravenous contrast. COMPARISON:  No prior MRI, correlation is made with CT head 08/21/2022 FINDINGS: Brain: No restricted diffusion to suggest acute or subacute infarct.No acute hemorrhage, mass, mass effect, or midline shift. No hydrocephalus or extra-axial collection.No hemosiderin deposition to suggest remote hemorrhage.Normal pituitary and craniocervical junction. Mildly advanced cerebral volume loss for age, without definite lobar predominance. Ventricular and sulcal size are commensurate. Confluent T2 hyperintense signal in the periventricular white matter, likely the sequela of moderate chronic small vessel ischemic disease. Vascular: Patent arterial flow voids. Skull and upper cervical spine: Normal marrow signal. Sinuses/Orbits: Clear paranasal sinuses.No acute finding in the orbits. Other: The mastoid air cells are well aerated. IMPRESSION: No acute intracranial process. No evidence of acute or subacute infarct. Electronically Signed   By: Merilyn Baba M.D.   On: 08/21/2022 23:44   CT CHEST ABDOMEN PELVIS WO CONTRAST  Result Date: 08/21/2022 CLINICAL DATA:  Unintentional weight loss EXAM: CT CHEST, ABDOMEN AND  PELVIS WITHOUT CONTRAST TECHNIQUE: Multidetector CT imaging of the chest, abdomen and pelvis was performed following the standard protocol without IV contrast. RADIATION DOSE REDUCTION: This exam was performed according to the departmental dose-optimization program which includes automated exposure control, adjustment of the mA and/or kV according to patient size and/or use of iterative reconstruction technique. COMPARISON:  CT abdomen pelvis 02/08/2013 FINDINGS: CT CHEST FINDINGS Cardiovascular: Extensive multi-vessel coronary artery calcification. Global cardiac size within normal limits. No pericardial effusion. Central pulmonary arteries are of normal caliber. Mild atherosclerotic calcification within the thoracic aorta. No aortic aneurysm. Mediastinum/Nodes: No enlarged mediastinal, hilar, or axillary lymph nodes. Thyroid gland, trachea, and esophagus demonstrate no significant findings. Lungs/Pleura: Mild emphysema. Bronchial wall thickening present in keeping with airway inflammation. No confluent pulmonary infiltrate. No pneumothorax or pleural effusion. No central obstructing lesion. Musculoskeletal: Osseous structures are diffusely osteopenic. Multiple midthoracic remote appearing compression deformities are seen with mild loss of height. No acute bone abnormality. No lytic or blastic bone lesion. CT ABDOMEN PELVIS FINDINGS Hepatobiliary: Cholelithiasis without pericholecystic inflammatory change. No focal liver abnormality seen on this noncontrast examination. No intra or extrahepatic biliary ductal dilation. Pancreas: Unremarkable Spleen: Unremarkable Adrenals/Urinary Tract: The adrenal glands are unremarkable. The kidneys are normal in size and position. Multiple simple cortical cysts are seen within the kidneys bilaterally. No follow-up imaging is recommended for these lesions. No hydronephrosis. No intrarenal or ureteral calculi. The bladder is distended but is otherwise unremarkable. Stomach/Bowel:  The stomach, small bowel, and large bowel are unremarkable save for mild descending colonic diverticulosis. No evidence of obstruction or focal inflammation. Gas beneath the right hemidiaphragm is within the hepatic flexure of the colon. No free intraperitoneal gas or fluid. Appendix normal. Vascular/Lymphatic: Aortic atherosclerosis. No enlarged abdominal or pelvic lymph nodes. Reproductive: Prostate is unremarkable. Other: No abdominal wall hernia. Musculoskeletal: Left hip ORIF has been performed. Osseous structures are diffusely osteopenic. No acute bone abnormality. No lytic or blastic bone lesion. IMPRESSION: 1. No acute intrathoracic or intra-abdominal pathology identified. No definite radiographic explanation for the patient's reported symptoms. 2. Extensive multi-vessel coronary artery calcification. 3. Mild emphysema. 4. Cholelithiasis. 5. Mild descending colonic diverticulosis. Aortic Atherosclerosis (ICD10-I70.0) and Emphysema (ICD10-J43.9). Electronically Signed   By: Fidela Salisbury M.D.   On: 08/21/2022 22:51   US RENAL  Result Date: 08/21/2022 CLINICAL DATA:  Renal dysfunction EXAM: RENAL / URINARY TRACT ULTRASOUND COMPLETE COMPARISON:  11/19/2016 FINDINGS: Right Kidney: Renal measurements: 10.6 x 6.3 x 5.4 cm = volume: 180.5 mL. There is no hydronephrosis. There is 1.6 cm cyst in the midportion. Left Kidney: Renal measurements: 11.5 x 6.6 x 5.8 cm = volume: 228.9 mL. There is no hydronephrosis. There is 2.9 x 1.7 cm cyst in the midportion with thin internal septation. This may be due to confluence of 2 adjacent cysts. Bladder: Appears normal for degree of bladder distention. Other: None. IMPRESSION: There is no hydronephrosis.  Bilateral renal cysts. Electronically Signed   By: Elmer Picker M.D.   On: 08/21/2022 21:06   US Abdomen Limited RUQ (LIVER/GB)  Result Date: 08/21/2022 CLINICAL DATA:  Elevated liver function test. EXAM: ULTRASOUND ABDOMEN LIMITED RIGHT UPPER QUADRANT COMPARISON:   None Available. FINDINGS: Gallbladder: No gallstones or wall thickening visualized (2.2 mm). No sonographic Murphy sign noted by sonographer. Common bile duct: Diameter: 4.6 mm Liver: No focal lesion identified. Diffusely increased echogenicity of the liver parenchyma is noted. Portal vein is patent on color Doppler imaging with normal direction of blood flow towards the liver. Other: Limited study secondary to limited patient cooperation during the study. IMPRESSION: Hepatic steatosis without focal liver lesions. Electronically Signed   By: Virgina Norfolk M.D.   On: 08/21/2022 21:06   CT HEAD WO CONTRAST (5MM)  Result Date: 08/21/2022 CLINICAL DATA:  Weakness, illness since Christmas EXAM: CT HEAD WITHOUT CONTRAST TECHNIQUE: Contiguous axial images were obtained  from the base of the skull through the vertex without intravenous contrast. RADIATION DOSE REDUCTION: This exam was performed according to the departmental dose-optimization program which includes automated exposure control, adjustment of the mA and/or kV according to patient size and/or use of iterative reconstruction technique. COMPARISON:  02/12/2020 FINDINGS: Brain: No acute infarct or hemorrhage. Lateral ventricles and midline structures are unremarkable. No acute extra-axial fluid collections. No mass effect. Stable cerebral atrophy. Vascular: No hyperdense vessel or unexpected calcification. Skull: Normal. Negative for fracture or focal lesion. Sinuses/Orbits: No acute finding. Other: None. IMPRESSION: 1. Stable head CT, no acute intracranial process. Electronically Signed   By: Randa Ngo M.D.   On: 08/21/2022 18:10   DG Chest Port 1 View  Result Date: 08/21/2022 CLINICAL DATA:  Sepsis.  Weakness and not acting himself. EXAM: PORTABLE CHEST 1 VIEW COMPARISON:  Chest radiograph dated May 04, 2020 FINDINGS: The heart size and mediastinal contours are within normal limits. Aortic atherosclerotic calcifications. Both lungs are clear.  The visualized skeletal structures are unremarkable. IMPRESSION: No active disease. Electronically Signed   By: Keane Police D.O.   On: 08/21/2022 17:06    EKG: Independently reviewed.  Performed yesterday and showed sinus tachycardia with significant baseline artifact (machine interpreted this is A-fib).  Intraventricular conduction delay.  Assessment/Plan Principal Problem:   Acute encephalopathy Active Problems:   Adjustment disorder with mixed anxiety and depressed mood   Anxiety   Essential hypertension   History of gastric ulcer   Heartburn   History of alcohol abuse   Hypercholesterolemia   Acute encephalopathy ?Seizures > Presented with altered mental status as per HPI. Has continued to have confusion over the past day while admitted. > Workup with negative HIV, negative RPR, normal ESR, normal B12.  No evidence of infectious etiology and no leukocytosis. > CT head and MRI brain negative at [facility]. > Has a history of episodes that are seizure-like without ever seeing a doctor for them.  Did have elevated lactic acid which has cleared from 5.2-2.4 on day of admission.  But normal CK.  Normal VBG.  Normal ethanol level as well as normal ammonia. > Neurology consulted and recommended transfer to [facility] for continuous EEG.  Additionally recommended Warnicke dose thiamine considering his alcohol use history - Continue to monitor on progressive unit - Neurology notified of patient arrival, appreciate recommendations - EEG as per neurology - Keppra IV twice daily - Thiamine 500 mg IV 3 times daily  History of alcohol use Elevated LFTs > Known history of significant alcohol use and withdrawal.  Wife reports last drink was a month ago.  In the ED he states he discontinued drinking 6 drinks a day however he was confused at the time that he stated this. > Patient noted to have mildly elevated LFTs in the ED which are now downtrending with AST and ALT of 56 and 47 respectively.   possibly secondary to alcohol use. > Monitored on CIWA with Ativan at [facility], of which she did receive several doses of Ativan in addition to doses of Valium and Librium. > Unclear if he has been continuing to drink, ethanol level was negative on arrival. - Continue to monitor on progressive unit as above - Continue with CIWA with Ativan for now unless neurology would prefer to hold off on Ativan for seizure workup - Receiving Warnicke dose thiamine as above - Continue with folate and multivitamin  Hypokalemia > Noted to have potassium of 3.1 this morning.  Had been started  on some IV fluids with potassium added, unclear how much was received. Magnesium normal at [facility]. > Of note did present with AKI with resolved after around 3 L of fluids given. - Will give additional 20 mill equivalents p.o. potassium if tolerating p.o.  Diabetes - SSI  Anxiety - Continue home Celexa  GERD PUD - Continue home PPI  Hypertension - Continue home atenolol  DVT prophylaxis: Lovenox Code Status:   Full, Confirmed by records of transferring hospital. Family Communication:  None on admission, Attempted to call wife by phone but reached automated message about voice mailbox not being set up yet. Disposition Plan:   Patient is [facility]:  [facility]  Anticipated DC to:  Pending clinical course  Anticipated DC date:  2 to 5 days  Anticipated DC barriers: None  Consults called:  Neurology Admission status:  Inpatient, progressive  Severity of Illness: The appropriate patient status for this patient is INPATIENT. Inpatient status is judged to be reasonable and necessary in order to provide the required intensity of service to ensure the patient's safety. The patient's presenting symptoms, physical exam findings, and initial radiographic and laboratory data in the context of their chronic comorbidities is felt to place them at high risk for further clinical deterioration. Furthermore, it is not anticipated that the  patient will be medically stable for discharge from the hospital within 2 midnights of admission.   * I certify that at the point of admission it is my clinical judgment that the patient will require inpatient hospital care spanning beyond 2 midnights from the point of admission due to high intensity of service, high risk for further deterioration and high frequency of surveillance required.Marcelyn Bruins MD [facility]  How to contact the [facility] Attending or Consulting provider [place] or covering provider during after hours [place], for this patient?   Check the care team in [facility] and look for a) attending/consulting [facility] provider listed and b) the [facility] team listed Log into [email or web address] and use [place]'s universal password to access. If you do not have the password, please contact the hospital operator. Locate the [facility] provider you are looking for under [facility] and page to a number that you can be directly reached. If you still have difficulty reaching the provider, please page the [facility] (Director on Call) for the Hospitalists listed on amion for assistance.  09/14/2022, 6:09 PM

## 2022-08-22 NOTE — Progress Notes (Signed)
LTM EEG hooked up and running - no initial skin breakdown - push button tested - [facility] monitoring.  

## 2022-08-22 NOTE — Progress Notes (Addendum)
PROGRESS NOTE    Derrick Jones  HCW:[id number] DOB: [birth year] DOA: 08/21/2022 PCP: Cletis Athens, MD     Brief Narrative:   From admission h and p  Derrick Jones is a 77 y.o. male with medical history significant of Hypertension, Dm2, Hx of ETOH dep in remission presents with altered mental status starting this am.  Pt was sleeping on couch last nite and found on floor this am.  Pt appeared confused according to his wife.  Wife states that he has not drank etoh in 1 month.  Pt states drinks 6 pack per day.  But appears somewhat confused.  Axox1.  (Person).  Pt denies headache, vision change, hearing change, n/v, abd pain, diarrhea, brbpr, black stool, dysuria, hematuria   Assessment & Plan:   Principal Problem:   Altered mental status Active Problems:   Essential hypertension   History of gastric ulcer   History of esophageal stricture   Prediabetes   History of alcohol abuse   Type 2 diabetes mellitus with hyperlipidemia (HCC)   AMS (altered mental status)   Abnormal liver function   ARF (acute renal failure) ([place])   Weight loss  # Acute encephalopathy # Seizure Etiology unclear. MRI brain negative. Did have lactic acidosis on presentation and aki but those have cleared. Did get some ativan last night. Presently obtunded. Seizure? Wernicke? Covid/flu/rsv neg. No fever to suggest meningitis. Has received benzos here. Wife at bedside reports she thinks he has a seizure disorder as he has had multiple episodes over the years of going stiff and unconscious followed by confusion. He hasn't seen a doctor for this but she thinks these are seizures.  - neuro consult. They advise transfer to [place] for continuous eeg - keppra started - start thiamine high dose for 3 days - f/u blood and urine cultures - stop scheduled benzos  # Alcohol abuse Obtunded, unable to get hx from patient, no answer when wife called. Per h and p wife says hasn't been drinking but patient did endorse it. She  thinks it is very unlikely he has had alcohol in over a month. - cont ciwa protocol, vitamins  # T2DM Presented with lactic acidosis - hold metformin now and at discharge - SSI  # GAD - resume home citalopram when taking po  # BPH Renal u/s no hydro - home flomax when taking po  # HTN Bp mild elevation - home atenolol when taking po  # LFT elevation Likely 2/2 etoh. Ruq u/s nothing acute - f/u hep serologies  # Thrombocytopenia Likely 2/2 etoh - f/u hiv, hep serologies  DVT prophylaxis: lovenox Code Status: full Family Communication: wife updated @ bedside  Level of care: Stepdown Status is: Inpatient Remains inpatient appropriate because: severity of illness    Consultants:  neurology  Procedures: none  Antimicrobials:  none    Subjective: obtunded  Objective: Vitals:   08/21/2022 0600 08/21/2022 0700 09/16/2022 0800 09/08/2022 0900  BP: (!) 138/91 (!) 136/90 (!) 172/90 (!) 138/95  Pulse: (!) 106 (!) 106 (!) 106 (!) 110  Resp: '17 18 15 16  '$ Temp:   98.1 F (36.7 C)   TempSrc:   Oral   SpO2: 98% 93% 94% 94%  Weight:        Intake/Output Summary (Last 24 hours) at 09/15/2022 1041 Last data filed at 09/01/2022 0400 Gross per 24 hour  Intake 2392.88 ml  Output 1250 ml  Net 1142.88 ml   Filed Weights   08/26/2022 0445  Weight: 52.1 kg    Examination:  General exam: asleep obtunded Respiratory system: Clear to auscultation. Respiratory effort normal. Cardiovascular system: S1 & S2 heard, tachycardic. No JVD, murmurs, rubs, gallops or clicks. . Gastrointestinal system: Abdomen is nondistended, soft and nontender. No organomegaly or masses felt. Normal bowel sounds heard. Central nervous system: asleep, arouses but doesn't awaken with sternal rub Extremities: warm, no edema Skin: No rashes, lesions or ulcers Psychiatry: calm    Data Reviewed: I have personally reviewed following labs and imaging studies  CBC: Recent Labs  Lab 08/21/22 1629  08/20/2022 0627  WBC 9.3 7.8  NEUTROABS 6.9  --   HGB 15.0 12.8*  HCT 45.5 37.6*  MCV 95.8 93.5  PLT 152 846*   Basic Metabolic Panel: Recent Labs  Lab 08/21/22 1629 08/21/22 1846 08/18/2022 0627  NA 141  --  146*  K 3.7  --  3.1*  CL 96*  --  105  CO2 14*  --  29  GLUCOSE 104*  --  97  BUN 50*  --  28*  CREATININE 1.95*  --  0.87  CALCIUM 9.5  --  8.5*  MG  --  2.2  --    GFR: CrCl cannot be calculated (Unknown ideal weight.). Liver Function Tests: Recent Labs  Lab 08/21/22 1629 08/30/2022 0627  AST 83* 56*  ALT 59* 47*  ALKPHOS 66 47  BILITOT 3.1* 2.0*  PROT 8.0 6.2*  ALBUMIN 4.6 3.5   No results for input(s): "LIPASE", "AMYLASE" in the last 168 hours. Recent Labs  Lab 08/21/22 1846  AMMONIA 16   Coagulation Profile: Recent Labs  Lab 08/21/22 1846 09/07/2022 0627  INR 1.1 1.0   Cardiac Enzymes: Recent Labs  Lab 08/21/22 1846  CKTOTAL 330   BNP (last 3 results) No results for input(s): "PROBNP" in the last 8760 hours. HbA1C: No results for input(s): "HGBA1C" in the last 72 hours. CBG: Recent Labs  Lab 08/21/22 2037 08/21/22 2352 08/21/2022 0743  GLUCAP 171* 184* 102*   Lipid Profile: No results for input(s): "CHOL", "HDL", "LDLCALC", "TRIG", "CHOLHDL", "LDLDIRECT" in the last 72 hours. Thyroid Function Tests: Recent Labs    09/09/2022 0627  TSH 1.606   Anemia Panel: Recent Labs    09/03/2022 0627  VITAMINB12 528   Urine analysis:    Component Value Date/Time   COLORURINE YELLOW (A) 08/21/2022 2140   APPEARANCEUR CLEAR (A) 08/21/2022 2140   APPEARANCEUR Clear 02/08/2013 1549   LABSPEC 1.013 08/21/2022 2140   LABSPEC 1.015 02/08/2013 1549   PHURINE 5.0 08/21/2022 2140   GLUCOSEU >=500 (A) 08/21/2022 2140   GLUCOSEU Negative 02/08/2013 1549   HGBUR NEGATIVE 08/21/2022 2140   BILIRUBINUR NEGATIVE 08/21/2022 2140   BILIRUBINUR Negative 02/08/2013 1549   KETONESUR 20 (A) 08/21/2022 2140   PROTEINUR 30 (A) 08/21/2022 2140   NITRITE  NEGATIVE 08/21/2022 2140   LEUKOCYTESUR NEGATIVE 08/21/2022 2140   LEUKOCYTESUR Negative 02/08/2013 1549   Sepsis Labs: '@LABRCNTIP'$ (procalcitonin:4,lacticidven:4)  ) Recent Results (from the past 240 hour(s))  Culture, blood (Routine x 2)     Status: None (Preliminary result)   Collection Time: 08/21/22  4:47 PM   Specimen: Right Antecubital; Blood  Result Value Ref Range Status   Specimen Description RIGHT ANTECUBITAL  Final   Special Requests   Final    BOTTLES DRAWN AEROBIC AND ANAEROBIC Blood Culture adequate volume   Culture   Final    NO GROWTH < 24 HOURS Performed at [facility], [address] [place], [place] [address]    Report Status PENDING  Incomplete  Culture, blood (Routine x 2)     Status: None (Preliminary result)   Collection Time: 08/21/22  4:47 PM   Specimen: BLOOD RIGHT ARM  Result Value Ref Range Status   Specimen Description BLOOD RIGHT ARM  Final   Special Requests   Final    BOTTLES DRAWN AEROBIC AND ANAEROBIC Blood Culture adequate volume   Culture   Final    NO GROWTH < 24 HOURS Performed at [facility], [address] [place], [place] [address]    Report Status PENDING  Incomplete  Resp panel by RT-PCR (RSV, Flu A&B, Covid) Anterior Nasal Swab     Status: None   Collection Time: 08/21/22  8:32 PM   Specimen: Anterior Nasal Swab  Result Value Ref Range Status   SARS Coronavirus 2 by RT PCR NEGATIVE NEGATIVE Final    Comment: (NOTE) SARS-CoV-2 target nucleic acids are NOT DETECTED.  The SARS-CoV-2 RNA is generally detectable in upper respiratory specimens during the acute phase of infection. The lowest concentration of SARS-CoV-2 viral copies this assay can detect is 138 copies/mL. A negative result does not preclude SARS-Cov-2 infection and should not be used as the sole basis for treatment or other patient management decisions. A negative result may occur with  improper specimen collection/handling, submission of  specimen other than nasopharyngeal swab, presence of viral mutation(s) within the areas targeted by this assay, and inadequate number of viral copies(<138 copies/mL). A negative result must be combined with clinical observations, patient history, and epidemiological information. The expected result is Negative.  Fact Sheet for Patients:  EntrepreneurPulse.com.au  Fact Sheet for Healthcare Providers:  IncredibleEmployment.be  This test is no t yet approved or cleared by the [place] FDA and  has been authorized for detection and/or diagnosis of SARS-CoV-2 by FDA under an Emergency Use Authorization (EUA). This EUA will remain  in effect (meaning this test can be used) for the duration of the COVID-19 declaration under Section 564(b)(1) of the Act, 21 U.S.C.section 360bbb-3(b)(1), unless the authorization is terminated  or revoked sooner.       Influenza A by PCR NEGATIVE NEGATIVE Final   Influenza B by PCR NEGATIVE NEGATIVE Final    Comment: (NOTE) The Xpert Xpress SARS-CoV-2/FLU/RSV plus assay is intended as an aid in the diagnosis of influenza from Nasopharyngeal swab specimens and should not be used as a sole basis for treatment. Nasal washings and aspirates are unacceptable for Xpert Xpress SARS-CoV-2/FLU/RSV testing.  Fact Sheet for Patients: EntrepreneurPulse.com.au  Fact Sheet for Healthcare Providers: IncredibleEmployment.be  This test is not yet approved or cleared by the [place] FDA and has been authorized for detection and/or diagnosis of SARS-CoV-2 by FDA under an Emergency Use Authorization (EUA). This EUA will remain in effect (meaning this test can be used) for the duration of the COVID-19 declaration under Section 564(b)(1) of the Act, 21 U.S.C. section 360bbb-3(b)(1), unless the authorization is terminated or revoked.     Resp Syncytial Virus by PCR NEGATIVE NEGATIVE Final     Comment: (NOTE) Fact Sheet for Patients: EntrepreneurPulse.com.au  Fact Sheet for Healthcare Providers: IncredibleEmployment.be  This test is not yet approved or cleared by the [place] FDA and has been authorized for detection and/or diagnosis of SARS-CoV-2 by FDA under an Emergency Use Authorization (EUA). This EUA will remain in effect (meaning this test can be used) for the duration of the COVID-19 declaration under Section 564(b)(1)  of the Act, 21 U.S.C. section 360bbb-3(b)(1), unless the authorization is terminated or revoked.  Performed at [facility], [address], [place], [place] [address]          Radiology Studies: MR BRAIN WO CONTRAST  Result Date: 08/21/2022 CLINICAL DATA:  Weakness and altered mental status EXAM: MRI HEAD WITHOUT CONTRAST TECHNIQUE: Multiplanar, multiecho pulse sequences of the brain and surrounding structures were obtained without intravenous contrast. COMPARISON:  No prior MRI, correlation is made with CT head 08/21/2022 FINDINGS: Brain: No restricted diffusion to suggest acute or subacute infarct.No acute hemorrhage, mass, mass effect, or midline shift. No hydrocephalus or extra-axial collection.No hemosiderin deposition to suggest remote hemorrhage.Normal pituitary and craniocervical junction. Mildly advanced cerebral volume loss for age, without definite lobar predominance. Ventricular and sulcal size are commensurate. Confluent T2 hyperintense signal in the periventricular white matter, likely the sequela of moderate chronic small vessel ischemic disease. Vascular: Patent arterial flow voids. Skull and upper cervical spine: Normal marrow signal. Sinuses/Orbits: Clear paranasal sinuses.No acute finding in the orbits. Other: The mastoid air cells are well aerated. IMPRESSION: No acute intracranial process. No evidence of acute or subacute infarct. Electronically Signed   By: Merilyn Baba M.D.   On:  08/21/2022 23:44   CT CHEST ABDOMEN PELVIS WO CONTRAST  Result Date: 08/21/2022 CLINICAL DATA:  Unintentional weight loss EXAM: CT CHEST, ABDOMEN AND PELVIS WITHOUT CONTRAST TECHNIQUE: Multidetector CT imaging of the chest, abdomen and pelvis was performed following the standard protocol without IV contrast. RADIATION DOSE REDUCTION: This exam was performed according to the departmental dose-optimization program which includes automated exposure control, adjustment of the mA and/or kV according to patient size and/or use of iterative reconstruction technique. COMPARISON:  CT abdomen pelvis 02/08/2013 FINDINGS: CT CHEST FINDINGS Cardiovascular: Extensive multi-vessel coronary artery calcification. Global cardiac size within normal limits. No pericardial effusion. Central pulmonary arteries are of normal caliber. Mild atherosclerotic calcification within the thoracic aorta. No aortic aneurysm. Mediastinum/Nodes: No enlarged mediastinal, hilar, or axillary lymph nodes. Thyroid gland, trachea, and esophagus demonstrate no significant findings. Lungs/Pleura: Mild emphysema. Bronchial wall thickening present in keeping with airway inflammation. No confluent pulmonary infiltrate. No pneumothorax or pleural effusion. No central obstructing lesion. Musculoskeletal: Osseous structures are diffusely osteopenic. Multiple midthoracic remote appearing compression deformities are seen with mild loss of height. No acute bone abnormality. No lytic or blastic bone lesion. CT ABDOMEN PELVIS FINDINGS Hepatobiliary: Cholelithiasis without pericholecystic inflammatory change. No focal liver abnormality seen on this noncontrast examination. No intra or extrahepatic biliary ductal dilation. Pancreas: Unremarkable Spleen: Unremarkable Adrenals/Urinary Tract: The adrenal glands are unremarkable. The kidneys are normal in size and position. Multiple simple cortical cysts are seen within the kidneys bilaterally. No follow-up imaging is  recommended for these lesions. No hydronephrosis. No intrarenal or ureteral calculi. The bladder is distended but is otherwise unremarkable. Stomach/Bowel: The stomach, small bowel, and large bowel are unremarkable save for mild descending colonic diverticulosis. No evidence of obstruction or focal inflammation. Gas beneath the right hemidiaphragm is within the hepatic flexure of the colon. No free intraperitoneal gas or fluid. Appendix normal. Vascular/Lymphatic: Aortic atherosclerosis. No enlarged abdominal or pelvic lymph nodes. Reproductive: Prostate is unremarkable. Other: No abdominal wall hernia. Musculoskeletal: Left hip ORIF has been performed. Osseous structures are diffusely osteopenic. No acute bone abnormality. No lytic or blastic bone lesion. IMPRESSION: 1. No acute intrathoracic or intra-abdominal pathology identified. No definite radiographic explanation for the patient's reported symptoms. 2. Extensive multi-vessel coronary artery calcification. 3. Mild emphysema. 4. Cholelithiasis.  5. Mild descending colonic diverticulosis. Aortic Atherosclerosis (ICD10-I70.0) and Emphysema (ICD10-J43.9). Electronically Signed   By: Fidela Salisbury M.D.   On: 08/21/2022 22:51   US RENAL  Result Date: 08/21/2022 CLINICAL DATA:  Renal dysfunction EXAM: RENAL / URINARY TRACT ULTRASOUND COMPLETE COMPARISON:  11/19/2016 FINDINGS: Right Kidney: Renal measurements: 10.6 x 6.3 x 5.4 cm = volume: 180.5 mL. There is no hydronephrosis. There is 1.6 cm cyst in the midportion. Left Kidney: Renal measurements: 11.5 x 6.6 x 5.8 cm = volume: 228.9 mL. There is no hydronephrosis. There is 2.9 x 1.7 cm cyst in the midportion with thin internal septation. This may be due to confluence of 2 adjacent cysts. Bladder: Appears normal for degree of bladder distention. Other: None. IMPRESSION: There is no hydronephrosis.  Bilateral renal cysts. Electronically Signed   By: Elmer Picker M.D.   On: 08/21/2022 21:06   US Abdomen  Limited RUQ (LIVER/GB)  Result Date: 08/21/2022 CLINICAL DATA:  Elevated liver function test. EXAM: ULTRASOUND ABDOMEN LIMITED RIGHT UPPER QUADRANT COMPARISON:  None Available. FINDINGS: Gallbladder: No gallstones or wall thickening visualized (2.2 mm). No sonographic Murphy sign noted by sonographer. Common bile duct: Diameter: 4.6 mm Liver: No focal lesion identified. Diffusely increased echogenicity of the liver parenchyma is noted. Portal vein is patent on color Doppler imaging with normal direction of blood flow towards the liver. Other: Limited study secondary to limited patient cooperation during the study. IMPRESSION: Hepatic steatosis without focal liver lesions. Electronically Signed   By: Virgina Norfolk M.D.   On: 08/21/2022 21:06   CT HEAD WO CONTRAST (5MM)  Result Date: 08/21/2022 CLINICAL DATA:  Weakness, illness since Christmas EXAM: CT HEAD WITHOUT CONTRAST TECHNIQUE: Contiguous axial images were obtained from the base of the skull through the vertex without intravenous contrast. RADIATION DOSE REDUCTION: This exam was performed according to the departmental dose-optimization program which includes automated exposure control, adjustment of the mA and/or kV according to patient size and/or use of iterative reconstruction technique. COMPARISON:  02/12/2020 FINDINGS: Brain: No acute infarct or hemorrhage. Lateral ventricles and midline structures are unremarkable. No acute extra-axial fluid collections. No mass effect. Stable cerebral atrophy. Vascular: No hyperdense vessel or unexpected calcification. Skull: Normal. Negative for fracture or focal lesion. Sinuses/Orbits: No acute finding. Other: None. IMPRESSION: 1. Stable head CT, no acute intracranial process. Electronically Signed   By: Randa Ngo M.D.   On: 08/21/2022 18:10   DG Chest Port 1 View  Result Date: 08/21/2022 CLINICAL DATA:  Sepsis.  Weakness and not acting himself. EXAM: PORTABLE CHEST 1 VIEW COMPARISON:  Chest radiograph  dated May 04, 2020 FINDINGS: The heart size and mediastinal contours are within normal limits. Aortic atherosclerotic calcifications. Both lungs are clear. The visualized skeletal structures are unremarkable. IMPRESSION: No active disease. Electronically Signed   By: Keane Police D.O.   On: 08/21/2022 17:06        Scheduled Meds:  atenolol  100 mg Oral Daily   chlordiazePOXIDE  25 mg Oral QHS   folic acid  1 mg Oral Daily   heparin  5,000 Units Subcutaneous Q8H   insulin aspart  0-5 Units Subcutaneous QHS   insulin aspart  0-6 Units Subcutaneous TID WC   LORazepam  0-4 mg Intravenous Q4H   Followed by   Derrill Memo ON 08/23/2022] LORazepam  0-4 mg Intravenous Q8H   multivitamin with minerals  1 tablet Oral Daily   pantoprazole  40 mg Oral Daily   thiamine  100 mg Oral Daily  Or   thiamine  100 mg Intravenous Daily   Continuous Infusions:  sodium chloride 100 mL/hr (08/30/2022 1032)     LOS: 1 day     Desma Maxim, MD [facility]   If 7PM-7AM, please contact night-coverage [email or web address] Password [facility] 08/23/2022, 10:41 AM

## 2022-08-22 NOTE — Consult Note (Signed)
NEUROLOGY CONSULTATION NOTE   Date of service: August 22, 2022 Patient Name: Derrick Jones MRN:  [id number] DOB:  1946-04-01 Reason for consult: possible seizures Requesting physician: Dr. Laurey Arrow _ _ _   _ __   _ __ _ _  __ __   _ __   __ _  History of Present Illness   This is a 77 yo man with hx HTN, DM2, EtOH abuse who presented with AMS since yesterday AM. Wife states that he has not had EtOH in one month. He was sleeping on the couch the night before and she found him on the floor yesterday AM. Patient stated in ED that he drinks a 6 pack per day but he was confused at the time. His mental status has been fluctuating since admission between obtundation and confusion oriented only to self. MRI brain personal review showed no acute process. Wife told hospitalist that he has had episodes for a year in which he goes stiff than unconscious but these have never been worked up or treated.   ROS   UTA 2/2 mental status  Past History   I have reviewed the following:  Past Medical History:  Diagnosis Date   Diabetes mellitus without complication ([place])    GERD (gastroesophageal reflux disease)    Hypertension    Past Surgical History:  Procedure Laterality Date   COLONOSCOPY WITH PROPOFOL N/A 07/21/2019   Procedure: COLONOSCOPY WITH PROPOFOL;  Surgeon: Jonathon Bellows, MD;  Location: [facility] ENDOSCOPY;  Service: Gastroenterology;  Laterality: N/A;   ESOPHAGOGASTRODUODENOSCOPY (EGD) WITH PROPOFOL N/A 03/11/2020   Procedure: ESOPHAGOGASTRODUODENOSCOPY (EGD) WITH PROPOFOL;  Surgeon: Jonathon Bellows, MD;  Location: [facility] ENDOSCOPY;  Service: Gastroenterology;  Laterality: N/A;   ESOPHAGOGASTRODUODENOSCOPY (EGD) WITH PROPOFOL N/A 04/11/2020   Procedure: ESOPHAGOGASTRODUODENOSCOPY (EGD) WITH PROPOFOL;  Surgeon: Jonathon Bellows, MD;  Location: [facility] ENDOSCOPY;  Service: Gastroenterology;  Laterality: N/A;   ESOPHAGOGASTRODUODENOSCOPY (EGD) WITH PROPOFOL N/A 07/29/2020   Procedure: ESOPHAGOGASTRODUODENOSCOPY (EGD)  WITH PROPOFOL;  Surgeon: Jonathon Bellows, MD;  Location: [facility] ENDOSCOPY;  Service: Gastroenterology;  Laterality: N/A;   EYE SURGERY     FEMUR FRACTURE SURGERY Left    FRACTURE SURGERY     INTRAMEDULLARY (IM) NAIL INTERTROCHANTERIC Left 05/03/2020   Procedure: INTRAMEDULLARY (IM) NAIL INTERTROCHANTRIC;  Surgeon: Leim Fabry, MD;  Location: [facility] ORS;  Service: Orthopedics;  Laterality: Left;   Family History  Problem Relation Age of Onset   Breast cancer Sister    Ovarian cancer Sister    Brain cancer Daughter    Social History   Socioeconomic History   Marital status: Married    Spouse name: Not on file   Number of children: Not on file   Years of education: Not on file   Highest education level: Not on file  Occupational History   Not on file  Tobacco Use   Smoking status: Every Day    Packs/day: 1.00    Years: 53.00    Total pack years: 53.00    Types: Cigarettes   Smokeless tobacco: Never  Vaping Use   Vaping Use: Never used  Substance and Sexual Activity   Alcohol use: Yes   Drug use: No   Sexual activity: Yes  Other Topics Concern   Not on file  Social History Narrative   Not on file   Social Determinants of Health   Financial Resource Strain: Not on file  Food Insecurity: Not on file  Transportation Needs: Not on file  Physical Activity: Not on file  Stress: Not on file  Social Connections: Not on file   No Known Allergies  Medications   Medications Prior to Admission  Medication Sig Dispense Refill Last Dose   albuterol (VENTOLIN HFA) 108 (90 Base) MCG/ACT inhaler Inhale 2 puffs into the lungs every 6 (six) hours as needed for wheezing or shortness of breath. 8 g 0    albuterol (VENTOLIN HFA) 108 (90 Base) MCG/ACT inhaler Inhale 2 puffs into the lungs every 4 (four) hours as needed. 18 g 1    atenolol (TENORMIN) 100 MG tablet Take 1 tablet (100 mg total) by mouth daily. 90 tablet 2    citalopram (CELEXA) 10 MG tablet Take 1 tablet (10 mg total) by mouth  daily. 30 tablet 3    fluticasone (FLOVENT HFA) 110 MCG/ACT inhaler Inhale 1 puff into the lungs 2 (two) times daily. 1 each 12    glucose blood (ONETOUCH VERIO) test strip Use as instructed 100 each 12    glucose blood test strip Check blood sugar 2 times daily 100 each 12    levETIRAcetam (KEPRRA) 500 MG/100ML SOLN Inject 100 mLs (500 mg total) into the vein every 12 (twelve) hours. 4000 mL     Multiple Vitamin (MULTIVITAMIN WITH MINERALS) TABS tablet Take 1 tablet by mouth daily.      omeprazole (PRILOSEC) 20 MG capsule Take 1 capsule (20 mg total) by mouth daily. 90 capsule 3    tamsulosin (FLOMAX) 0.4 MG CAPS capsule Take 1 capsule (0.4 mg total) by mouth daily after supper. 30 capsule 0    thiamine 100 MG tablet Take 1 tablet (100 mg total) by mouth daily. 30 tablet 0      No current facility-administered medications for this encounter.  Vitals   There were no vitals filed for this visit.   There is no height or weight on file to calculate BMI.  Physical Exam   Physical Exam Gen: alert, not oriented even to self HEENT: Atraumatic, normocephalic;mucous membranes moist; oropharynx clear, tongue without atrophy or fasciculations. Neck: Supple, trachea midline. Resp: CTAB, no w/r/r CV: RRR, no m/g/r; nml S1 and S2. 2+ symmetric peripheral pulses. Abd: soft/NT/ND; nabs x 4 quad Extrem: Nml bulk; no cyanosis, clubbing, or edema.  Neuro: *MS: alert, not oriented even to self, does not follow commands *Speech: moderate dysarthria *CN: PERRL, blinks to threat bilat, (+) oculocephalics, face symmetric at rest, hearing intact to voice *Motor:   grips hands bilat and wiggles toes bilat, symmetric *Sensory: SILT *Coordination:  UTA *Reflexes:  2+ and symmetric throughout without clonus; toes down-going bilat *Gait: UTA   Labs   CBC:  Recent Labs  Lab 08/21/22 1629 09/12/2022 0627  WBC 9.3 7.8  NEUTROABS 6.9  --   HGB 15.0 12.8*  HCT 45.5 37.6*  MCV 95.8 93.5  PLT 152 108*     Basic Metabolic Panel:  Lab Results  Component Value Date   NA 146 (H) 08/23/2022   K 3.1 (L) 08/29/2022   CO2 29 08/21/2022   GLUCOSE 97 08/28/2022   BUN 28 (H) 09/14/2022   CREATININE 0.87 09/06/2022   CALCIUM 8.5 (L) 09/16/2022   GFRNONAA >60 09/07/2022   GFRAA 104 06/05/2020   Lipid Panel:  Lab Results  Component Value Date   LDLCALC 145 (H) 07/02/2017   HgbA1c:  Lab Results  Component Value Date   HGBA1C 6.4 (H) 05/02/2020   Urine Drug Screen:     Component Value Date/Time   LABOPIA NONE DETECTED 12/26/2019 2054  COCAINSCRNUR NONE DETECTED 12/26/2019 2054   LABBENZ POSITIVE (A) 12/26/2019 2054   AMPHETMU NONE DETECTED 12/26/2019 2054   THCU NONE DETECTED 12/26/2019 2054   LABBARB NONE DETECTED 12/26/2019 2054    Alcohol Level     Component Value Date/Time   ETH <10 08/21/2022 1846     Impression   This is a 77 yo man with hx HTN, DM2, EtOH abuse abstinent x1 mo per wife who presented with AMS since yesterday AM. RI brain personal review showed no acute process. His mental status has been fluctuating since admission between obtundation and confusion oriented only to self. MWife told hospitalist that he has had episodes for a year in which he goes stiff than unconscious but these have never been worked up or treated. Given concern for possible nonconvulsive seizures patient will require transfer to [facility] for cEEG  Recommendations   - Transfer to [facility] for cEEG - Notify neurology on patient arrival - Keppra '500mg'$  bid - Thiamine - [place] dosing thiamine ______________________________________________________________________   Thank you for the opportunity to take part in the care of this patient. If you have any further questions, please contact the neurology consultation attending.  Signed,  Su Monks, MD [facility] [phone]  If 7pm- 7am, please page neurology on call as listed in [place].  **Any copied and pasted  documentation in this note was written by me in another application not billed for and pasted by me into this document.

## 2022-08-22 NOTE — Discharge Summary (Signed)
Derrick Jones QQV:[id number] DOB: 11/16/1945 DOA: 08/21/2022  PCP: Derrick Athens, MD  Admit date: 08/21/2022 Discharge date: 09/03/2022  Time spent: 45 minutes    Discharge Diagnoses:  Principal Problem:   Altered mental status Active Problems:   Essential hypertension   History of gastric ulcer   History of esophageal stricture   Prediabetes   History of alcohol abuse   Type 2 diabetes mellitus with hyperlipidemia (HCC)   AMS (altered mental status)   Abnormal liver function   ARF (acute renal failure) (HCC)   Weight loss   Acute encephalopathy   Discharge Condition: stable  Diet recommendation: npo  Filed Weights   09/08/2022 0445  Weight: 52.1 kg    History of present illness:  From admission h and p Derrick Jones is a 77 y.o. male with medical history significant of Hypertension, Dm2, Hx of ETOH dep in remission presents with altered mental status starting this am.  Pt was sleeping on couch last nite and found on floor this am.  Pt appeared confused according to his wife.  Wife states that he has not drank etoh in 1 month.  Pt states drinks 6 pack per day.  But appears somewhat confused.  Axox1.  (Person).  Pt denies headache, vision change, hearing change, n/v, abd pain, diarrhea, brbpr, black stool, dysuria, hematuria   Hospital Course:   # Acute encephalopathy # Seizure? MRI brain negative. Did have lactic acidosis on presentation and aki but those have cleared. Did get some ativan last night. Obtunded this morning, now has regained some degree of consciousness but not able to communicate with [place]. Seizure? Wernicke? Covid/flu/rsv neg. No fever to suggest meningitis. Has received benzos here. Wife at bedside reports she thinks he has a seizure disorder as he has had multiple episodes over the years of going stiff and unconscious followed by confusion. He hasn't seen a doctor for this but she thinks these are seizures.  - neuro consulted. They advise transfer to [place] for  continuous eeg - keppra started - started thiamine high dose for 3 days - f/u blood and urine cultures - stopped scheduled benzos   # Alcohol abuse Unable to get hx from patient, wife says she is confident he hasn't had a drink for over a month. - cont ciwa protocol, vitamins   # T2DM Presented with lactic acidosis - hold metformin now and at discharge - SSI   # GAD - resume home citalopram when taking po   # BPH Renal u/s no hydro - home flomax when taking po   # HTN Bp mild elevation - home atenolol when taking po   # LFT elevation Likely 2/2 etoh. Ruq u/s nothing acute - f/u hep serologies   # Thrombocytopenia Likely 2/2 etoh - f/u hiv, hep serologies  Procedures: none  Consultations: neurology  Discharge Exam: Vitals:   08/25/2022 1300 09/15/2022 1400  BP: (!) 142/96 (!) 139/96  Pulse: (!) 109 (!) 103  Resp: 16 13  Temp:    SpO2: 93% 95%   General exam: awake, confused, doesn't respond to questions Respiratory system: Clear to auscultation. Respiratory effort normal. Cardiovascular system: S1 & S2 heard, tachycardic. No JVD, murmurs, rubs, gallops or clicks. . Gastrointestinal system: Abdomen is nondistended, soft and nontender. No organomegaly or masses felt. Normal bowel sounds heard. Central nervous system: moving all 4 Extremities: warm, no edema Skin: No rashes, lesions or ulcers Psychiatry: very confused  Discharge Instructions   Discharge Instructions  Increase activity slowly   Complete by: As directed    No wound care   Complete by: As directed       Allergies as of 08/17/2022   No Known Allergies      Medication List     STOP taking these medications    folic acid 1 MG tablet Commonly known as: FOLVITE   LORazepam 0.5 MG tablet Commonly known as: Ativan   metFORMIN 500 MG tablet Commonly known as: GLUCOPHAGE       TAKE these medications    albuterol 108 (90 Base) MCG/ACT inhaler Commonly known as: VENTOLIN  HFA Inhale 2 puffs into the lungs every 6 (six) hours as needed for wheezing or shortness of breath.   albuterol 108 (90 Base) MCG/ACT inhaler Commonly known as: VENTOLIN HFA Inhale 2 puffs into the lungs every 4 (four) hours as needed.   atenolol 100 MG tablet Commonly known as: TENORMIN Take 1 tablet (100 mg total) by mouth daily.   citalopram 10 MG tablet Commonly known as: CELEXA Take 1 tablet (10 mg total) by mouth daily.   fluticasone 110 MCG/ACT inhaler Commonly known as: Flovent HFA Inhale 1 puff into the lungs 2 (two) times daily.   glucose blood test strip Check blood sugar 2 times daily   OneTouch Verio test strip Generic drug: glucose blood Use as instructed   levETIRAcetam 500 MG/100ML Soln Commonly known as: KEPRRA Inject 100 mLs (500 mg total) into the vein every 12 (twelve) hours.   multivitamin with minerals Tabs tablet Take 1 tablet by mouth daily.   omeprazole 20 MG capsule Commonly known as: PRILOSEC Take 1 capsule (20 mg total) by mouth daily.   tamsulosin 0.4 MG Caps capsule Commonly known as: FLOMAX Take 1 capsule (0.4 mg total) by mouth daily after supper.   thiamine 100 MG tablet Commonly known as: VITAMIN B1 Take 1 tablet (100 mg total) by mouth daily.       No Known Allergies    The results of significant diagnostics from this hospitalization (including imaging, microbiology, ancillary and laboratory) are listed below for reference.    Significant Diagnostic Studies: MR BRAIN WO CONTRAST  Result Date: 08/21/2022 CLINICAL DATA:  Weakness and altered mental status EXAM: MRI HEAD WITHOUT CONTRAST TECHNIQUE: Multiplanar, multiecho pulse sequences of the brain and surrounding structures were obtained without intravenous contrast. COMPARISON:  No prior MRI, correlation is made with CT head 08/21/2022 FINDINGS: Brain: No restricted diffusion to suggest acute or subacute infarct.No acute hemorrhage, mass, mass effect, or midline shift. No  hydrocephalus or extra-axial collection.No hemosiderin deposition to suggest remote hemorrhage.Normal pituitary and craniocervical junction. Mildly advanced cerebral volume loss for age, without definite lobar predominance. Ventricular and sulcal size are commensurate. Confluent T2 hyperintense signal in the periventricular white matter, likely the sequela of moderate chronic small vessel ischemic disease. Vascular: Patent arterial flow voids. Skull and upper cervical spine: Normal marrow signal. Sinuses/Orbits: Clear paranasal sinuses.No acute finding in the orbits. Other: The mastoid air cells are well aerated. IMPRESSION: No acute intracranial process. No evidence of acute or subacute infarct. Electronically Signed   By: Merilyn Baba M.D.   On: 08/21/2022 23:44   CT CHEST ABDOMEN PELVIS WO CONTRAST  Result Date: 08/21/2022 CLINICAL DATA:  Unintentional weight loss EXAM: CT CHEST, ABDOMEN AND PELVIS WITHOUT CONTRAST TECHNIQUE: Multidetector CT imaging of the chest, abdomen and pelvis was performed following the standard protocol without IV contrast. RADIATION DOSE REDUCTION: This exam was performed according to the departmental  dose-optimization program which includes automated exposure control, adjustment of the mA and/or kV according to patient size and/or use of iterative reconstruction technique. COMPARISON:  CT abdomen pelvis 02/08/2013 FINDINGS: CT CHEST FINDINGS Cardiovascular: Extensive multi-vessel coronary artery calcification. Global cardiac size within normal limits. No pericardial effusion. Central pulmonary arteries are of normal caliber. Mild atherosclerotic calcification within the thoracic aorta. No aortic aneurysm. Mediastinum/Nodes: No enlarged mediastinal, hilar, or axillary lymph nodes. Thyroid gland, trachea, and esophagus demonstrate no significant findings. Lungs/Pleura: Mild emphysema. Bronchial wall thickening present in keeping with airway inflammation. No confluent pulmonary  infiltrate. No pneumothorax or pleural effusion. No central obstructing lesion. Musculoskeletal: Osseous structures are diffusely osteopenic. Multiple midthoracic remote appearing compression deformities are seen with mild loss of height. No acute bone abnormality. No lytic or blastic bone lesion. CT ABDOMEN PELVIS FINDINGS Hepatobiliary: Cholelithiasis without pericholecystic inflammatory change. No focal liver abnormality seen on this noncontrast examination. No intra or extrahepatic biliary ductal dilation. Pancreas: Unremarkable Spleen: Unremarkable Adrenals/Urinary Tract: The adrenal glands are unremarkable. The kidneys are normal in size and position. Multiple simple cortical cysts are seen within the kidneys bilaterally. No follow-up imaging is recommended for these lesions. No hydronephrosis. No intrarenal or ureteral calculi. The bladder is distended but is otherwise unremarkable. Stomach/Bowel: The stomach, small bowel, and large bowel are unremarkable save for mild descending colonic diverticulosis. No evidence of obstruction or focal inflammation. Gas beneath the right hemidiaphragm is within the hepatic flexure of the colon. No free intraperitoneal gas or fluid. Appendix normal. Vascular/Lymphatic: Aortic atherosclerosis. No enlarged abdominal or pelvic lymph nodes. Reproductive: Prostate is unremarkable. Other: No abdominal wall hernia. Musculoskeletal: Left hip ORIF has been performed. Osseous structures are diffusely osteopenic. No acute bone abnormality. No lytic or blastic bone lesion. IMPRESSION: 1. No acute intrathoracic or intra-abdominal pathology identified. No definite radiographic explanation for the patient's reported symptoms. 2. Extensive multi-vessel coronary artery calcification. 3. Mild emphysema. 4. Cholelithiasis. 5. Mild descending colonic diverticulosis. Aortic Atherosclerosis (ICD10-I70.0) and Emphysema (ICD10-J43.9). Electronically Signed   By: Fidela Salisbury M.D.   On:  08/21/2022 22:51   US RENAL  Result Date: 08/21/2022 CLINICAL DATA:  Renal dysfunction EXAM: RENAL / URINARY TRACT ULTRASOUND COMPLETE COMPARISON:  11/19/2016 FINDINGS: Right Kidney: Renal measurements: 10.6 x 6.3 x 5.4 cm = volume: 180.5 mL. There is no hydronephrosis. There is 1.6 cm cyst in the midportion. Left Kidney: Renal measurements: 11.5 x 6.6 x 5.8 cm = volume: 228.9 mL. There is no hydronephrosis. There is 2.9 x 1.7 cm cyst in the midportion with thin internal septation. This may be due to confluence of 2 adjacent cysts. Bladder: Appears normal for degree of bladder distention. Other: None. IMPRESSION: There is no hydronephrosis.  Bilateral renal cysts. Electronically Signed   By: Elmer Picker M.D.   On: 08/21/2022 21:06   US Abdomen Limited RUQ (LIVER/GB)  Result Date: 08/21/2022 CLINICAL DATA:  Elevated liver function test. EXAM: ULTRASOUND ABDOMEN LIMITED RIGHT UPPER QUADRANT COMPARISON:  None Available. FINDINGS: Gallbladder: No gallstones or wall thickening visualized (2.2 mm). No sonographic Murphy sign noted by sonographer. Common bile duct: Diameter: 4.6 mm Liver: No focal lesion identified. Diffusely increased echogenicity of the liver parenchyma is noted. Portal vein is patent on color Doppler imaging with normal direction of blood flow towards the liver. Other: Limited study secondary to limited patient cooperation during the study. IMPRESSION: Hepatic steatosis without focal liver lesions. Electronically Signed   By: Virgina Norfolk M.D.   On: 08/21/2022 21:06   CT HEAD  WO CONTRAST (5MM)  Result Date: 08/21/2022 CLINICAL DATA:  Weakness, illness since Christmas EXAM: CT HEAD WITHOUT CONTRAST TECHNIQUE: Contiguous axial images were obtained from the base of the skull through the vertex without intravenous contrast. RADIATION DOSE REDUCTION: This exam was performed according to the departmental dose-optimization program which includes automated exposure control, adjustment of  the mA and/or kV according to patient size and/or use of iterative reconstruction technique. COMPARISON:  02/12/2020 FINDINGS: Brain: No acute infarct or hemorrhage. Lateral ventricles and midline structures are unremarkable. No acute extra-axial fluid collections. No mass effect. Stable cerebral atrophy. Vascular: No hyperdense vessel or unexpected calcification. Skull: Normal. Negative for fracture or focal lesion. Sinuses/Orbits: No acute finding. Other: None. IMPRESSION: 1. Stable head CT, no acute intracranial process. Electronically Signed   By: Randa Ngo M.D.   On: 08/21/2022 18:10   DG Chest Port 1 View  Result Date: 08/21/2022 CLINICAL DATA:  Sepsis.  Weakness and not acting himself. EXAM: PORTABLE CHEST 1 VIEW COMPARISON:  Chest radiograph dated May 04, 2020 FINDINGS: The heart size and mediastinal contours are within normal limits. Aortic atherosclerotic calcifications. Both lungs are clear. The visualized skeletal structures are unremarkable. IMPRESSION: No active disease. Electronically Signed   By: Keane Police D.O.   On: 08/21/2022 17:06    Microbiology: Recent Results (from the past 240 hour(s))  Culture, blood (Routine x 2)     Status: None (Preliminary result)   Collection Time: 08/21/22  4:47 PM   Specimen: Right Antecubital; Blood  Result Value Ref Range Status   Specimen Description RIGHT ANTECUBITAL  Final   Special Requests   Final    BOTTLES DRAWN AEROBIC AND ANAEROBIC Blood Culture adequate volume   Culture   Final    NO GROWTH < 24 HOURS Performed at [facility], [address] [place], [place] [address]    Report Status PENDING  Incomplete  Culture, blood (Routine x 2)     Status: None (Preliminary result)   Collection Time: 08/21/22  4:47 PM   Specimen: BLOOD RIGHT ARM  Result Value Ref Range Status   Specimen Description BLOOD RIGHT ARM  Final   Special Requests   Final    BOTTLES DRAWN AEROBIC AND ANAEROBIC Blood Culture adequate volume    Culture   Final    NO GROWTH < 24 HOURS Performed at [facility], [address], [place], [place] [address]    Report Status PENDING  Incomplete  Resp panel by RT-PCR (RSV, Flu A&B, Covid) Anterior Nasal Swab     Status: None   Collection Time: 08/21/22  8:32 PM   Specimen: Anterior Nasal Swab  Result Value Ref Range Status   SARS Coronavirus 2 by RT PCR NEGATIVE NEGATIVE Final    Comment: (NOTE) SARS-CoV-2 target nucleic acids are NOT DETECTED.  The SARS-CoV-2 RNA is generally detectable in upper respiratory specimens during the acute phase of infection. The lowest concentration of SARS-CoV-2 viral copies this assay can detect is 138 copies/mL. A negative result does not preclude SARS-Cov-2 infection and should not be used as the sole basis for treatment or other patient management decisions. A negative result may occur with  improper specimen collection/handling, submission of specimen other than nasopharyngeal swab, presence of viral mutation(s) within the areas targeted by this assay, and inadequate number of viral copies(<138 copies/mL). A negative result must be combined with clinical observations, patient history, and epidemiological information. The expected result is Negative.  Fact Sheet for Patients:  EntrepreneurPulse.com.au  Fact Sheet for Healthcare Providers:  IncredibleEmployment.be  This test is no t yet approved or cleared by the [place] FDA and  has been authorized for detection and/or diagnosis of SARS-CoV-2 by FDA under an Emergency Use Authorization (EUA). This EUA will remain  in effect (meaning this test can be used) for the duration of the COVID-19 declaration under Section 564(b)(1) of the Act, 21 U.S.C.section 360bbb-3(b)(1), unless the authorization is terminated  or revoked sooner.       Influenza A by PCR NEGATIVE NEGATIVE Final   Influenza B by PCR NEGATIVE NEGATIVE Final    Comment:  (NOTE) The Xpert Xpress SARS-CoV-2/FLU/RSV plus assay is intended as an aid in the diagnosis of influenza from Nasopharyngeal swab specimens and should not be used as a sole basis for treatment. Nasal washings and aspirates are unacceptable for Xpert Xpress SARS-CoV-2/FLU/RSV testing.  Fact Sheet for Patients: EntrepreneurPulse.com.au  Fact Sheet for Healthcare Providers: IncredibleEmployment.be  This test is not yet approved or cleared by the [place] FDA and has been authorized for detection and/or diagnosis of SARS-CoV-2 by FDA under an Emergency Use Authorization (EUA). This EUA will remain in effect (meaning this test can be used) for the duration of the COVID-19 declaration under Section 564(b)(1) of the Act, 21 U.S.C. section 360bbb-3(b)(1), unless the authorization is terminated or revoked.     Resp Syncytial Virus by PCR NEGATIVE NEGATIVE Final    Comment: (NOTE) Fact Sheet for Patients: EntrepreneurPulse.com.au  Fact Sheet for Healthcare Providers: IncredibleEmployment.be  This test is not yet approved or cleared by the [place] FDA and has been authorized for detection and/or diagnosis of SARS-CoV-2 by FDA under an Emergency Use Authorization (EUA). This EUA will remain in effect (meaning this test can be used) for the duration of the COVID-19 declaration under Section 564(b)(1) of the Act, 21 U.S.C. section 360bbb-3(b)(1), unless the authorization is terminated or revoked.  Performed at [facility], [address] Madelaine Bhat [place], [place] [address]      Labs: Basic Metabolic Panel: Recent Labs  Lab 08/21/22 1629 08/21/22 1846 09/01/2022 0619 09/12/2022 0627  NA 141  --   --  146*  K 3.7  --   --  3.1*  CL 96*  --   --  105  CO2 14*  --   --  29  GLUCOSE 104*  --   --  97  BUN 50*  --   --  28*  CREATININE 1.95*  --   --  0.87  CALCIUM 9.5  --   --  8.5*  MG  --  2.2  1.8  --    Liver Function Tests: Recent Labs  Lab 08/21/22 1629 08/29/2022 0627  AST 83* 56*  ALT 59* 47*  ALKPHOS 66 47  BILITOT 3.1* 2.0*  PROT 8.0 6.2*  ALBUMIN 4.6 3.5   No results for input(s): "LIPASE", "AMYLASE" in the last 168 hours. Recent Labs  Lab 08/21/22 1846  AMMONIA 16   CBC: Recent Labs  Lab 08/21/22 1629 09/16/2022 0627  WBC 9.3 7.8  NEUTROABS 6.9  --   HGB 15.0 12.8*  HCT 45.5 37.6*  MCV 95.8 93.5  PLT 152 108*   Cardiac Enzymes: Recent Labs  Lab 08/21/22 1846  CKTOTAL 330   BNP: BNP (last 3 results) No results for input(s): "BNP" in the last 8760 hours.  ProBNP (last 3 results) No results for input(s): "PROBNP" in the last 8760 hours.  CBG: Recent Labs  Lab 08/21/22  2037 08/21/22 2352 08/25/2022 0743 09/12/2022 1145  GLUCAP 171* 184* 102* 116*       Signed:  Desma Maxim MD.  [facility] 08/18/2022, 3:17 PM

## 2022-08-23 DIAGNOSIS — F1010 Alcohol abuse, uncomplicated: Secondary | ICD-10-CM | POA: Diagnosis not present

## 2022-08-23 DIAGNOSIS — R4182 Altered mental status, unspecified: Secondary | ICD-10-CM | POA: Diagnosis not present

## 2022-08-23 DIAGNOSIS — G9340 Encephalopathy, unspecified: Secondary | ICD-10-CM | POA: Diagnosis not present

## 2022-08-23 DIAGNOSIS — E876 Hypokalemia: Secondary | ICD-10-CM

## 2022-08-23 LAB — COMPREHENSIVE METABOLIC PANEL
ALT: 43 U/L (ref 0–44)
AST: 49 U/L — ABNORMAL HIGH (ref 15–41)
Albumin: 3.2 g/dL — ABNORMAL LOW (ref 3.5–5.0)
Alkaline Phosphatase: 48 U/L (ref 38–126)
Anion gap: 16 — ABNORMAL HIGH (ref 5–15)
BUN: 14 mg/dL (ref 8–23)
CO2: 24 mmol/L (ref 22–32)
Calcium: 8.3 mg/dL — ABNORMAL LOW (ref 8.9–10.3)
Chloride: 103 mmol/L (ref 98–111)
Creatinine, Ser: 0.82 mg/dL (ref 0.61–1.24)
GFR, Estimated: >60 mL/min (ref >60)
Glucose, Bld: 106 mg/dL — ABNORMAL HIGH (ref 70–99)
Potassium: 2.8 mmol/L — ABNORMAL LOW (ref 3.5–5.1)
Sodium: 143 mmol/L (ref 135–145)
Total Bilirubin: 1.7 mg/dL — ABNORMAL HIGH (ref 0.3–1.2)
Total Protein: 5.8 g/dL — ABNORMAL LOW (ref 6.5–8.1)

## 2022-08-23 LAB — CBC
HCT: 39.8 % (ref 39.0–52.0)
Hemoglobin: 13.6 g/dL (ref 13.0–17.0)
MCH: 32.2 pg (ref 26.0–34.0)
MCHC: 34.2 g/dL (ref 30.0–36.0)
MCV: 94.3 fL (ref 80.0–100.0)
Platelets: 89 10*3/uL — ABNORMAL LOW (ref 150–400)
RBC: 4.22 MIL/uL (ref 4.22–5.81)
RDW: 14.9 % (ref 11.5–15.5)
WBC: 7 10*3/uL (ref 4.0–10.5)
nRBC: 0 % (ref 0.0–0.2)

## 2022-08-23 LAB — GLUCOSE, CAPILLARY
Glucose-Capillary: 108 mg/dL — ABNORMAL HIGH (ref 70–99)
Glucose-Capillary: 111 mg/dL — ABNORMAL HIGH (ref 70–99)
Glucose-Capillary: 119 mg/dL — ABNORMAL HIGH (ref 70–99)
Glucose-Capillary: 129 mg/dL — ABNORMAL HIGH (ref 70–99)
Glucose-Capillary: 130 mg/dL — ABNORMAL HIGH (ref 70–99)
Glucose-Capillary: 149 mg/dL — ABNORMAL HIGH (ref 70–99)

## 2022-08-23 LAB — MAGNESIUM: Magnesium: 1.5 mg/dL — ABNORMAL LOW (ref 1.7–2.4)

## 2022-08-23 MED ORDER — HYDRALAZINE HCL 20 MG/ML IJ SOLN: 10.0000 mg | Freq: Four times a day (QID) | INTRAMUSCULAR | Status: DC | PRN

## 2022-08-23 MED ORDER — MAGNESIUM SULFATE 4 GM/100ML IV SOLN
4.0000 g | Freq: Once | INTRAVENOUS | Status: AC
  Administered 2022-08-23: 4 g via INTRAVENOUS
  Filled 2022-08-23: qty 100

## 2022-08-23 MED ORDER — DIAZEPAM 5 MG/ML IJ SOLN
10.0000 mg | Freq: Once | INTRAMUSCULAR | Status: AC
  Administered 2022-08-23: 10 mg via INTRAVENOUS
  Filled 2022-08-23: qty 2

## 2022-08-23 MED ORDER — FAMOTIDINE IN NACL 20-0.9 MG/50ML-% IV SOLN
20.0000 mg | INTRAVENOUS | Status: DC
  Administered 2022-08-23: 20 mg via INTRAVENOUS
  Filled 2022-08-23: qty 50

## 2022-08-23 MED ORDER — POTASSIUM CHLORIDE CRYS ER 20 MEQ PO TBCR
40.0000 meq | EXTENDED_RELEASE_TABLET | Freq: Once | ORAL | Status: DC
  Filled 2022-08-23: qty 2

## 2022-08-23 MED ORDER — POTASSIUM CHLORIDE 10 MEQ/100ML IV SOLN
10.0000 meq | INTRAVENOUS | Status: AC
  Administered 2022-08-23 (×2): 10 meq via INTRAVENOUS
  Filled 2022-08-23 (×2): qty 100

## 2022-08-23 MED ORDER — POTASSIUM CHLORIDE 10 MEQ/100ML IV SOLN
10.0000 meq | INTRAVENOUS | Status: AC
  Administered 2022-08-23 (×4): 10 meq via INTRAVENOUS
  Filled 2022-08-23 (×4): qty 100

## 2022-08-23 MED ORDER — LACTATED RINGERS IV SOLN: INTRAVENOUS | Status: DC

## 2022-08-23 MED ORDER — POTASSIUM CHLORIDE IN NACL 20-0.9 MEQ/L-% IV SOLN
INTRAVENOUS | Status: DC
  Filled 2022-08-23 (×2): qty 1000

## 2022-08-23 MED ORDER — FOLIC ACID 5 MG/ML IJ SOLN
1.0000 mg | Freq: Every day | INTRAMUSCULAR | Status: DC
  Administered 2022-08-23: 1 mg via INTRAVENOUS
  Filled 2022-08-23 (×2): qty 0.2

## 2022-08-23 MED ORDER — METOPROLOL TARTRATE 5 MG/5ML IV SOLN
5.0000 mg | Freq: Once | INTRAVENOUS | Status: AC
  Administered 2022-08-23: 5 mg via INTRAVENOUS
  Filled 2022-08-23: qty 5

## 2022-08-23 NOTE — Progress Notes (Signed)
Neurology Progress Note  Brief HPI: Patient with history of hypertension, diabetes, EtOH abuse in remission for 1 month presented with altered mental status since morning of 1/5.  Patient has a history of episodes in which she goes stiff and then becomes unconscious which have not been worked up yet.  He is undergoing continuous EEG for spell capture.  Interval/Subjective: Patient is very drowsy but will open eyes and respond to loud voice and touch.  He is unable to state his name or where he is. Did receive Valium 10 mg at 0132 AM  Additional history from wife and niece:   Lost their only child to cancer about 1 year, increased drinking since then.  Stopped drinking about 3 weeks ago after wife asked him to (after a million times), 18 beers / day before then Sat on couch, drink beer, living in mobile home Wife paid bills, managed meds, walker sometimes, would drive sometimes if not drinking, knew his birthday and wife's birthday, no wandering behavior, cooking without concerns per wife (did not ask for details on what he cooks) Rolled off the couch Friday for unclear reasons (shaking), wife couldn't get him up off the floor, arms/legs stiffened up able to look/track and mumbles "okay" (when asked to calm down), no incontinence, no tongue biting, he couldn't get back up himself  He has had similar spells after stopping drinking, always shaky when he's not drinking. Last time he tried to stop was in Oct 2023. Always stops cold [place] and has significant withdrawal symptoms including seizure like activity but unclear if actual seizures.   This time he's just slower to come around but otherwise not significantly different per family on phone. No fevers etc.  Risk factors:  Birth and development: no  Febrile seizures in childhood - no Significant head trauma - no Intracranial surgeries - no Mengingitis/Encephalitis history - no  Family history of seizures or developmental delay - no     Exam: Vitals:   08/23/22 0802 08/23/22 1151  BP: (!) 146/127 (!) 152/103  Pulse: (!) 104 (!) 103  Resp: 16 16  Temp: 97.7 F (36.5 C) 97.6 F (36.4 C)  SpO2: 96% 94%   Gen: In bed, NAD, Cachetic  Resp: non-labored breathing, no acute distress Abd: soft, nt  Neuro: Mental Status: Responds to loud voice and touch, localizes sternal rub.  Unable to state name or where he is.  Voice is very dysarthric and difficult to understand.  Able to follow commands intermittently Cranial Nerves: Face appears symmetrical, hearing intact to voice, extraocular movements intact (tracked NPs hand), voice dysarthric, uncooperative with tongue protrusion, PERRL (right possibly 1 mm larger than the left), equal blink to eyelash brush Motor/coordination: Reduced bulk thorughout, paratonia. Moves all extremities nonpurposefully with antigravity strength, can intermittently follow commands with BUE. After repeated noxious stim, swings at examiner, with poor coordination Sensory: Withdraws bilateral lower extremities to noxious stimuli DTR: Absent throughout Gait: Deferred  Pertinent Labs: CBC    Component Value Date/Time   WBC 7.0 08/23/2022 0307   RBC 4.22 08/23/2022 0307   HGB 13.6 08/23/2022 0307   HGB 11.8 (L) 06/05/2020 0849   HCT 39.8 08/23/2022 0307   HCT 35.8 (L) 06/05/2020 0849   PLT 89 (L) 08/23/2022 0307   PLT 274 06/05/2020 0849   MCV 94.3 08/23/2022 0307   MCV 95 06/05/2020 0849   MCV 85 02/08/2013 1549   MCH 32.2 08/23/2022 0307   MCHC 34.2 08/23/2022 0307   RDW 14.9 08/23/2022 0307  RDW 14.2 06/05/2020 0849   RDW 14.5 02/08/2013 1549   LYMPHSABS 1.7 08/21/2022 1629   LYMPHSABS 0.9 06/05/2020 0849   MONOABS 0.6 08/21/2022 1629   EOSABS 0.0 08/21/2022 1629   EOSABS 0.2 06/05/2020 0849   BASOSABS 0.0 08/21/2022 1629   BASOSABS 0.0 06/05/2020 0849       Latest Ref Rng & Units 08/23/2022    3:07 AM 08/28/2022    6:27 AM 08/21/2022    4:29 PM  BMP  Glucose 70 - 99 mg/dL 106   97  104   BUN 8 - 23 mg/dL 14  28  50   Creatinine 0.61 - 1.24 mg/dL 0.82  0.87  1.95   Sodium 135 - 145 mmol/L 143  146  141   Potassium 3.5 - 5.1 mmol/L 2.8  3.1  3.7   Chloride 98 - 111 mmol/L 103  105  96   CO2 22 - 32 mmol/L '24  29  14   '$ Calcium 8.9 - 10.3 mg/dL 8.3  8.5  9.5   HIV: Nonreactive RPR: Nonreactive ESR: 5 B12: 528 Thiamine unfortunately not obtained before supplementation TSH: 1.606 Ammonia: 16 on 08/21/21  Imaging Reviewed:  CT head: No acute abnormality  MRI brain: No acute abnormality  1/5 CT C/A/P w/o contrast 1. No acute intrathoracic or intra-abdominal pathology identified. No definite radiographic explanation for the patient's reported symptoms. 2. Extensive multi-vessel coronary artery calcification. 3. Mild emphysema. 4. Cholelithiasis. 5. Mild descending colonic diverticulosis.  EEG: No seizures or epileptiform discharges  Assessment: 77 year old patient with history of diabetes, hypertension and EtOH use who has not had a drink in 3 weeks presented yesterday with altered mental status.  CT head and MRI brain revealed no acute abnormalities to explain his AMS.  So far, laboratory studies to cannot explain his AMS either.  He has a history of spells in which he goes stiff and then becomes unconscious which have not yet been worked up.  He remains on continuous EEG to capture 1 of these spells and determine if it is seizure activity.  So far he has not had one of the spells captured. Will observe off of keppra. Suspect he may have alcohol related dementia  Impression: Altered mental status, possibly due to toxic metabolic encephalopathy with unclear source at this time in patient with potential seizure activity. Wernicke/Kossakoff's is also on the differential given alcohol use history  Recommendations: 1) continue long-term EEG for spell capture 2) continue thiamine supplementation, unfortunately level was not obtained before supplementation was  given 3) discontinue keppra for diagnostic clarity 4) LP declined by family at this time, they would prefer to see if he comes around with supportive care in the next few days 5) Family notes they plan to visit next Tuesday but appreciate phone updates in the interim 6) Neurology will continue to follow   [place] , MSN, AGACNP-BC [facility] See Amion for schedule and pager information 08/23/2022 12:45 PM   Attending Neurologist's note:  I personally saw this patient, gathering history, performing a full neurologic examination, reviewing relevant labs, personally reviewing relevant imaging including MRI brain and Head CT, and formulated the assessment and plan, adding the note above for completeness and clarity to accurately reflect my thoughts

## 2022-08-23 NOTE — Plan of Care (Signed)
  Problem: Clinical Measurements: Goal: Diagnostic test results will improve Outcome: Progressing Goal: Respiratory complications will improve Outcome: Progressing Goal: Cardiovascular complication will be avoided Outcome: Progressing   

## 2022-08-23 NOTE — Progress Notes (Signed)
vLTM maintenance   All impedances below 10kohms.  No skin breakdown noted at all skin sites 

## 2022-08-23 NOTE — Progress Notes (Signed)
[facility] PROGRESS NOTE   Derrick Jones YFV:[id number] DOB: September 11, 1945 DOA: 08/25/2022  PCP: Cletis Athens, MD  Brief History/Interval Summary: 77 y.o. male with medical history significant of diabetes, hyperlipidemia, alcohol use, hypertension, GERD, PUD, anxiety, depression, esophageal stricture presented with altered mental status.  Initially presented to [facility].  There was concern for seizure activity.  Workup in [facility] included negative HIV, RPR, normal ESR, normal B12.  Urinalysis with bacteria only.  No leukocytosis.  Normal CT head and MRI brain.  Neurology was consulted and recommended transfer to [facility] for continuous EEG and IV Keppra.  Due to his alcohol history recommendation was also for [place] dose thiamine.    Consultants: Neurology  Procedures: Continuous EEG    Subjective/Interval History: Patient unresponsive this morning.  Noted to be moving his extremities but is in restraints.  No family at bedside.    Assessment/Plan:  Acute encephalopathy Etiology unclear.  There was some concern for seizure activity.  Patient currently on long-term EEG monitoring.  Neurology is following. CT head and MRI brain were negative. Workup with negative HIV, negative RPR, normal ESR, vitamin B12 was 528.  No evidence of infectious etiology and no leukocytosis.  Ammonia level was normal. Patient started on Keppra.  Patient on high-dose thiamine. Remains NPO due to high risk of aspiration.  Alcohol abuse/abnormal LFTs Abnormal LFTs most likely due to alcoholism.  Patient currently on CIWA protocol.  Currently on thiamine. LFTs have been improving.  Hepatitis panel negative.  Right upper quadrant ultrasound shows hepatic steatosis without any acute findings.  Hypokalemia and hypomagnesemia Will be aggressively repleted  Thrombocytopenia Drop in platelet counts noted.  Could be due to alcoholism.  He was on heparin products which have been held.  Recheck labs tomorrow.     Acute kidney injury Presented with creatinine of 1.95.  Resolved with IV fluids.  Essential hypertension Noted to be on atenolol.  Monitor blood pressures closely.  Diabetes mellitus type 2 Appears that he was on metformin prior to admission which is currently on hold.  Monitor CBGs.  Continue SSI.  History of anxiety Resume Celexa when able to take orally.   DVT Prophylaxis: SCDs Code Status: Full code Family Communication: No family at bedside Disposition Plan: To be determined  Status is: Inpatient Remains inpatient appropriate because: Acute encephalopathy    Medications: Scheduled:  atenolol  100 mg Oral Daily   citalopram  10 mg Oral Daily   folic acid  1 mg Oral Daily   insulin aspart  0-9 Units Subcutaneous Q4H   multivitamin with minerals  1 tablet Oral Daily   pantoprazole  40 mg Oral Daily   potassium chloride  40 mEq Oral Once   sodium chloride flush  3 mL Intravenous Q12H   Continuous:  lactated ringers 75 mL/hr at 08/23/22 0055   magnesium sulfate bolus IVPB     potassium chloride     thiamine (VITAMIN B1) injection 500 mg (08/23/22 0928)   FMB:[id number] **OR** acetaminophen, albuterol, LORazepam, polyethylene glycol  Antibiotics: Anti-infectives (From admission, onward)    None       Objective:  Vital Signs  Vitals:   08/17/2022 1950 08/23/22 0037 08/23/22 0326 08/23/22 0802  BP: (!) 142/84 (!) 158/107 (!) 140/100 (!) 146/127  Pulse:    (!) 104  Resp:    16  Temp: 98.2 F (36.8 C) 98.1 F (36.7 C) 98.1 F (36.7 C) 97.7 F (36.5 C)  TempSrc: Axillary Axillary Axillary Oral  SpO2:  100% 100% 96% 96%  Height:       No intake or output data in the 24 hours ending 08/23/22 0928 There were no vitals filed for this visit.  General appearance: Unresponsive Resp: Clear to auscultation bilaterally.  Normal effort Cardio: S1-S2 is normal regular.  No S3-S4.  No rubs murmurs or bruit GI: Abdomen is soft.  Nontender nondistended.  Bowel  sounds are present normal.  No masses organomegaly Extremities: No edema.  Moving all of his extremities   Lab Results:  Data Reviewed: I have personally reviewed following labs and reports of the imaging studies  CBC: Recent Labs  Lab 08/21/22 1629 08/27/2022 0627 08/23/22 0307  WBC 9.3 7.8 7.0  NEUTROABS 6.9  --   --   HGB 15.0 12.8* 13.6  HCT 45.5 37.6* 39.8  MCV 95.8 93.5 94.3  PLT 152 108* 89*    Basic Metabolic Panel: Recent Labs  Lab 08/21/22 1629 08/21/22 1846 09/08/2022 0619 09/02/2022 0627 08/23/22 0307 08/23/22 0752  NA 141  --   --  146* 143  --   K 3.7  --   --  3.1* 2.8*  --   CL 96*  --   --  105 103  --   CO2 14*  --   --  29 24  --   GLUCOSE 104*  --   --  97 106*  --   BUN 50*  --   --  28* 14  --   CREATININE 1.95*  --   --  0.87 0.82  --   CALCIUM 9.5  --   --  8.5* 8.3*  --   MG  --  2.2 1.8  --   --  1.5*    GFR: Estimated Creatinine Clearance: 56.5 mL/min (by C-G formula based on SCr of 0.82 mg/dL).  Liver Function Tests: Recent Labs  Lab 08/21/22 1629 09/14/2022 0627 08/23/22 0307  AST 83* 56* 49*  ALT 59* 47* 43  ALKPHOS 66 47 48  BILITOT 3.1* 2.0* 1.7*  PROT 8.0 6.2* 5.8*  ALBUMIN 4.6 3.5 3.2*    Recent Labs  Lab 08/21/22 1846  AMMONIA 16    Coagulation Profile: Recent Labs  Lab 08/21/22 1846 09/05/2022 0627  INR 1.1 1.0    Cardiac Enzymes: Recent Labs  Lab 08/21/22 1846  CKTOTAL 330     CBG: Recent Labs  Lab 08/30/2022 1547 09/10/2022 2041 08/23/22 0038 08/23/22 0436 08/23/22 0801  GLUCAP 98 115* 108* 111* 130*     Thyroid Function Tests: Recent Labs    08/23/2022 0627  TSH 1.606    Anemia Panel: Recent Labs    08/27/2022 0627  VITAMINB12 528    Recent Results (from the past 240 hour(s))  Culture, blood (Routine x 2)     Status: None (Preliminary result)   Collection Time: 08/21/22  4:47 PM   Specimen: Right Antecubital; Blood  Result Value Ref Range Status   Specimen Description RIGHT ANTECUBITAL   Final   Special Requests   Final    BOTTLES DRAWN AEROBIC AND ANAEROBIC Blood Culture adequate volume   Culture   Final    NO GROWTH 2 DAYS Performed at [facility], [address] [place], [place] [address]    Report Status PENDING  Incomplete  Culture, blood (Routine x 2)     Status: None (Preliminary result)   Collection Time: 08/21/22  4:47 PM   Specimen: BLOOD RIGHT ARM  Result Value Ref Range Status  Specimen Description BLOOD RIGHT ARM  Final   Special Requests   Final    BOTTLES DRAWN AEROBIC AND ANAEROBIC Blood Culture adequate volume   Culture   Final    NO GROWTH 2 DAYS Performed at [facility], [address], [place], [place] [address]    Report Status PENDING  Incomplete  Resp panel by RT-PCR (RSV, Flu A&B, Covid) Anterior Nasal Swab     Status: None   Collection Time: 08/21/22  8:32 PM   Specimen: Anterior Nasal Swab  Result Value Ref Range Status   SARS Coronavirus 2 by RT PCR NEGATIVE NEGATIVE Final    Comment: (NOTE) SARS-CoV-2 target nucleic acids are NOT DETECTED.  The SARS-CoV-2 RNA is generally detectable in upper respiratory specimens during the acute phase of infection. The lowest concentration of SARS-CoV-2 viral copies this assay can detect is 138 copies/mL. A negative result does not preclude SARS-Cov-2 infection and should not be used as the sole basis for treatment or other patient management decisions. A negative result may occur with  improper specimen collection/handling, submission of specimen other than nasopharyngeal swab, presence of viral mutation(s) within the areas targeted by this assay, and inadequate number of viral copies(<138 copies/mL). A negative result must be combined with clinical observations, patient history, and epidemiological information. The expected result is Negative.  Fact Sheet for Patients:  EntrepreneurPulse.com.au  Fact Sheet for Healthcare Providers:   IncredibleEmployment.be  This test is no t yet approved or cleared by the [place] FDA and  has been authorized for detection and/or diagnosis of SARS-CoV-2 by FDA under an Emergency Use Authorization (EUA). This EUA will remain  in effect (meaning this test can be used) for the duration of the COVID-19 declaration under Section 564(b)(1) of the Act, 21 U.S.C.section 360bbb-3(b)(1), unless the authorization is terminated  or revoked sooner.       Influenza A by PCR NEGATIVE NEGATIVE Final   Influenza B by PCR NEGATIVE NEGATIVE Final    Comment: (NOTE) The Xpert Xpress SARS-CoV-2/FLU/RSV plus assay is intended as an aid in the diagnosis of influenza from Nasopharyngeal swab specimens and should not be used as a sole basis for treatment. Nasal washings and aspirates are unacceptable for Xpert Xpress SARS-CoV-2/FLU/RSV testing.  Fact Sheet for Patients: EntrepreneurPulse.com.au  Fact Sheet for Healthcare Providers: IncredibleEmployment.be  This test is not yet approved or cleared by the [place] FDA and has been authorized for detection and/or diagnosis of SARS-CoV-2 by FDA under an Emergency Use Authorization (EUA). This EUA will remain in effect (meaning this test can be used) for the duration of the COVID-19 declaration under Section 564(b)(1) of the Act, 21 U.S.C. section 360bbb-3(b)(1), unless the authorization is terminated or revoked.     Resp Syncytial Virus by PCR NEGATIVE NEGATIVE Final    Comment: (NOTE) Fact Sheet for Patients: EntrepreneurPulse.com.au  Fact Sheet for Healthcare Providers: IncredibleEmployment.be  This test is not yet approved or cleared by the [place] FDA and has been authorized for detection and/or diagnosis of SARS-CoV-2 by FDA under an Emergency Use Authorization (EUA). This EUA will remain in effect (meaning this test can be used) for  the duration of the COVID-19 declaration under Section 564(b)(1) of the Act, 21 U.S.C. section 360bbb-3(b)(1), unless the authorization is terminated or revoked.  Performed at [facility], [address], [place], [place] [address]       Radiology Studies: Overnight EEG with video  Result Date: 08/23/2022 Lora Havens, MD  08/23/2022  6:44 AM Patient Name: Derrick Jones MRN: [id number] Epilepsy Attending: Lora Havens Referring Physician/Provider: Lorenza Chick, MD Duration: 08/29/2022 1906 to 08/23/2022 0645 Patient history: 77 yo man with hx HTN, DM2, EtOH abuse abstinent x1 mo per wife who presented with AMS since yesterday AM. EEG to evaluate for seizure. Level of alertness: Awake, asleep AEDs during EEG study: LEV Technical aspects: This EEG study was done with scalp electrodes positioned according to the 10-20 International system of electrode placement. Electrical activity was reviewed with band pass filter of 1-'70Hz'$ , sensitivity of 7 uV/mm, display speed of 38m/sec with a '60Hz'$  notched filter applied as appropriate. EEG data were recorded continuously and digitally stored.  Video monitoring was available and reviewed as appropriate. Description: The posterior dominant rhythm consists of 8 Hz activity of moderate voltage (25-35 uV) seen predominantly in posterior head regions, symmetric and reactive to eye opening and eye closing. Sleep was characterized by vertex waves, sleep spindles (12 to 14 Hz), maximal frontocentral region. Hyperventilation and photic stimulation were not performed.   IMPRESSION: This study is within normal limits. No seizures or epileptiform discharges were seen throughout the recording. A normal interictal EEG does not exclude the diagnosis of epilepsy. PLora Havens  MR BRAIN WO CONTRAST  Result Date: 08/21/2022 CLINICAL DATA:  Weakness and altered mental status EXAM: MRI HEAD WITHOUT CONTRAST TECHNIQUE: Multiplanar, multiecho pulse sequences of  the brain and surrounding structures were obtained without intravenous contrast. COMPARISON:  No prior MRI, correlation is made with CT head 08/21/2022 FINDINGS: Brain: No restricted diffusion to suggest acute or subacute infarct.No acute hemorrhage, mass, mass effect, or midline shift. No hydrocephalus or extra-axial collection.No hemosiderin deposition to suggest remote hemorrhage.Normal pituitary and craniocervical junction. Mildly advanced cerebral volume loss for age, without definite lobar predominance. Ventricular and sulcal size are commensurate. Confluent T2 hyperintense signal in the periventricular white matter, likely the sequela of moderate chronic small vessel ischemic disease. Vascular: Patent arterial flow voids. Skull and upper cervical spine: Normal marrow signal. Sinuses/Orbits: Clear paranasal sinuses.No acute finding in the orbits. Other: The mastoid air cells are well aerated. IMPRESSION: No acute intracranial process. No evidence of acute or subacute infarct. Electronically Signed   By: AMerilyn BabaM.D.   On: 08/21/2022 23:44   CT CHEST ABDOMEN PELVIS WO CONTRAST  Result Date: 08/21/2022 CLINICAL DATA:  Unintentional weight loss EXAM: CT CHEST, ABDOMEN AND PELVIS WITHOUT CONTRAST TECHNIQUE: Multidetector CT imaging of the chest, abdomen and pelvis was performed following the standard protocol without IV contrast. RADIATION DOSE REDUCTION: This exam was performed according to the departmental dose-optimization program which includes automated exposure control, adjustment of the mA and/or kV according to patient size and/or use of iterative reconstruction technique. COMPARISON:  CT abdomen pelvis 02/08/2013 FINDINGS: CT CHEST FINDINGS Cardiovascular: Extensive multi-vessel coronary artery calcification. Global cardiac size within normal limits. No pericardial effusion. Central pulmonary arteries are of normal caliber. Mild atherosclerotic calcification within the thoracic aorta. No aortic  aneurysm. Mediastinum/Nodes: No enlarged mediastinal, hilar, or axillary lymph nodes. Thyroid gland, trachea, and esophagus demonstrate no significant findings. Lungs/Pleura: Mild emphysema. Bronchial wall thickening present in keeping with airway inflammation. No confluent pulmonary infiltrate. No pneumothorax or pleural effusion. No central obstructing lesion. Musculoskeletal: Osseous structures are diffusely osteopenic. Multiple midthoracic remote appearing compression deformities are seen with mild loss of height. No acute bone abnormality. No lytic or blastic bone lesion. CT ABDOMEN PELVIS FINDINGS Hepatobiliary: Cholelithiasis without pericholecystic inflammatory change. No focal liver abnormality  seen on this noncontrast examination. No intra or extrahepatic biliary ductal dilation. Pancreas: Unremarkable Spleen: Unremarkable Adrenals/Urinary Tract: The adrenal glands are unremarkable. The kidneys are normal in size and position. Multiple simple cortical cysts are seen within the kidneys bilaterally. No follow-up imaging is recommended for these lesions. No hydronephrosis. No intrarenal or ureteral calculi. The bladder is distended but is otherwise unremarkable. Stomach/Bowel: The stomach, small bowel, and large bowel are unremarkable save for mild descending colonic diverticulosis. No evidence of obstruction or focal inflammation. Gas beneath the right hemidiaphragm is within the hepatic flexure of the colon. No free intraperitoneal gas or fluid. Appendix normal. Vascular/Lymphatic: Aortic atherosclerosis. No enlarged abdominal or pelvic lymph nodes. Reproductive: Prostate is unremarkable. Other: No abdominal wall hernia. Musculoskeletal: Left hip ORIF has been performed. Osseous structures are diffusely osteopenic. No acute bone abnormality. No lytic or blastic bone lesion. IMPRESSION: 1. No acute intrathoracic or intra-abdominal pathology identified. No definite radiographic explanation for the patient's  reported symptoms. 2. Extensive multi-vessel coronary artery calcification. 3. Mild emphysema. 4. Cholelithiasis. 5. Mild descending colonic diverticulosis. Aortic Atherosclerosis (ICD10-I70.0) and Emphysema (ICD10-J43.9). Electronically Signed   By: Fidela Salisbury M.D.   On: 08/21/2022 22:51   US RENAL  Result Date: 08/21/2022 CLINICAL DATA:  Renal dysfunction EXAM: RENAL / URINARY TRACT ULTRASOUND COMPLETE COMPARISON:  11/19/2016 FINDINGS: Right Kidney: Renal measurements: 10.6 x 6.3 x 5.4 cm = volume: 180.5 mL. There is no hydronephrosis. There is 1.6 cm cyst in the midportion. Left Kidney: Renal measurements: 11.5 x 6.6 x 5.8 cm = volume: 228.9 mL. There is no hydronephrosis. There is 2.9 x 1.7 cm cyst in the midportion with thin internal septation. This may be due to confluence of 2 adjacent cysts. Bladder: Appears normal for degree of bladder distention. Other: None. IMPRESSION: There is no hydronephrosis.  Bilateral renal cysts. Electronically Signed   By: Elmer Picker M.D.   On: 08/21/2022 21:06   US Abdomen Limited RUQ (LIVER/GB)  Result Date: 08/21/2022 CLINICAL DATA:  Elevated liver function test. EXAM: ULTRASOUND ABDOMEN LIMITED RIGHT UPPER QUADRANT COMPARISON:  None Available. FINDINGS: Gallbladder: No gallstones or wall thickening visualized (2.2 mm). No sonographic Murphy sign noted by sonographer. Common bile duct: Diameter: 4.6 mm Liver: No focal lesion identified. Diffusely increased echogenicity of the liver parenchyma is noted. Portal vein is patent on color Doppler imaging with normal direction of blood flow towards the liver. Other: Limited study secondary to limited patient cooperation during the study. IMPRESSION: Hepatic steatosis without focal liver lesions. Electronically Signed   By: Virgina Norfolk M.D.   On: 08/21/2022 21:06   CT HEAD WO CONTRAST (5MM)  Result Date: 08/21/2022 CLINICAL DATA:  Weakness, illness since Christmas EXAM: CT HEAD WITHOUT CONTRAST TECHNIQUE:  Contiguous axial images were obtained from the base of the skull through the vertex without intravenous contrast. RADIATION DOSE REDUCTION: This exam was performed according to the departmental dose-optimization program which includes automated exposure control, adjustment of the mA and/or kV according to patient size and/or use of iterative reconstruction technique. COMPARISON:  02/12/2020 FINDINGS: Brain: No acute infarct or hemorrhage. Lateral ventricles and midline structures are unremarkable. No acute extra-axial fluid collections. No mass effect. Stable cerebral atrophy. Vascular: No hyperdense vessel or unexpected calcification. Skull: Normal. Negative for fracture or focal lesion. Sinuses/Orbits: No acute finding. Other: None. IMPRESSION: 1. Stable head CT, no acute intracranial process. Electronically Signed   By: Randa Ngo M.D.   On: 08/21/2022 18:10   DG Chest [facility]  Result Date: 08/21/2022 CLINICAL DATA:  Sepsis.  Weakness and not acting himself. EXAM: PORTABLE CHEST 1 VIEW COMPARISON:  Chest radiograph dated May 04, 2020 FINDINGS: The heart size and mediastinal contours are within normal limits. Aortic atherosclerotic calcifications. Both lungs are clear. The visualized skeletal structures are unremarkable. IMPRESSION: No active disease. Electronically Signed   By: Keane Police D.O.   On: 08/21/2022 17:06       LOS: 1 day   [facility] Pager on [email or web address]  08/23/2022, 9:28 AM

## 2022-08-23 NOTE — Procedures (Signed)
Patient Name: Derrick Jones  MRN: [id number]  Epilepsy Attending: Lora Havens  Referring Physician/Provider: Lorenza Chick, MD  Duration: 08/31/2022 1906 to 08/23/2022 1906  Patient history: 77 yo man with hx HTN, DM2, EtOH abuse abstinent x1 mo per wife who presented with AMS since yesterday AM. EEG to evaluate for seizure.  Level of alertness: Awake, asleep  AEDs during EEG study: LEV  Technical aspects: This EEG study was done with scalp electrodes positioned according to the 10-20 International system of electrode placement. Electrical activity was reviewed with band pass filter of 1-'70Hz'$ , sensitivity of 7 uV/mm, display speed of 33m/sec with a '60Hz'$  notched filter applied as appropriate. EEG data were recorded continuously and digitally stored.  Video monitoring was available and reviewed as appropriate.  Description: The posterior dominant rhythm consists of 8 Hz activity of moderate voltage (25-35 uV) seen predominantly in posterior head regions, symmetric and reactive to eye opening and eye closing. Sleep was characterized by vertex waves, sleep spindles (12 to 14 Hz), maximal frontocentral region. Hyperventilation and photic stimulation were not performed.     IMPRESSION: This study is within normal limits. No seizures or epileptiform discharges were seen throughout the recording.  A normal interictal EEG does not exclude the diagnosis of epilepsy.  Azul Brumett OBarbra Sarks

## 2022-08-24 ENCOUNTER — Inpatient Hospital Stay (HOSPITAL_COMMUNITY): Payer: PPO

## 2022-08-24 DIAGNOSIS — G9340 Encephalopathy, unspecified: Secondary | ICD-10-CM

## 2022-08-24 DIAGNOSIS — R579 Shock, unspecified: Secondary | ICD-10-CM

## 2022-08-24 DIAGNOSIS — I469 Cardiac arrest, cause unspecified: Secondary | ICD-10-CM

## 2022-08-24 DIAGNOSIS — J9601 Acute respiratory failure with hypoxia: Secondary | ICD-10-CM | POA: Diagnosis not present

## 2022-08-24 DIAGNOSIS — R4182 Altered mental status, unspecified: Secondary | ICD-10-CM | POA: Diagnosis not present

## 2022-08-24 DIAGNOSIS — S270XXA Traumatic pneumothorax, initial encounter: Secondary | ICD-10-CM

## 2022-08-24 LAB — HEMOGLOBIN A1C
Hgb A1c MFr Bld: 6.1 % — ABNORMAL HIGH (ref 4.8–5.6)
Mean Plasma Glucose: 128 mg/dL

## 2022-08-24 LAB — ANA W/REFLEX: Anti Nuclear Antibody (ANA): NEGATIVE

## 2022-08-24 LAB — GLUCOSE, CAPILLARY: Glucose-Capillary: 202 mg/dL — ABNORMAL HIGH (ref 70–99)

## 2022-08-24 MED ORDER — SODIUM BICARBONATE 8.4 % IV SOLN
50.0000 meq | Freq: Once | INTRAVENOUS | Status: AC
  Administered 2022-08-24: 50 meq via INTRAVENOUS

## 2022-08-24 MED ORDER — GLYCOPYRROLATE 1 MG PO TABS: 1.0000 mg | ORAL_TABLET | ORAL | Status: DC | PRN

## 2022-08-24 MED ORDER — POLYVINYL ALCOHOL 1.4 % OP SOLN: 1.0000 [drp] | Freq: Four times a day (QID) | OPHTHALMIC | Status: DC | PRN

## 2022-08-24 MED ORDER — MIDAZOLAM HCL 2 MG/2ML IJ SOLN: 2.0000 mg | INTRAMUSCULAR | Status: DC | PRN

## 2022-08-24 MED ORDER — LACTATED RINGERS IV BOLUS: 1000.0000 mL | Freq: Once | INTRAVENOUS | Status: DC

## 2022-08-24 MED ORDER — MORPHINE BOLUS VIA INFUSION: 5.0000 mg | INTRAVENOUS | Status: DC | PRN

## 2022-08-24 MED ORDER — GLYCOPYRROLATE 0.2 MG/ML IJ SOLN: 0.2000 mg | INTRAMUSCULAR | Status: DC | PRN

## 2022-08-24 MED ORDER — PHENYLEPHRINE HCL-NACL 20-0.9 MG/250ML-% IV SOLN: 25.0000 ug/min | INTRAVENOUS | Status: DC

## 2022-08-24 MED ORDER — ORAL CARE MOUTH RINSE: 15.0000 mL | OROMUCOSAL | Status: DC | PRN

## 2022-08-24 MED ORDER — MORPHINE 100MG IN NS 100ML (1MG/ML) PREMIX INFUSION
0.0000 mg/h | INTRAVENOUS | Status: DC
  Administered 2022-08-24: 5 mg/h via INTRAVENOUS
  Filled 2022-08-24: qty 100

## 2022-08-24 MED ORDER — ACETAMINOPHEN 325 MG PO TABS: 650.0000 mg | ORAL_TABLET | Freq: Four times a day (QID) | ORAL | Status: DC | PRN

## 2022-08-24 MED ORDER — ORAL CARE MOUTH RINSE: 15.0000 mL | OROMUCOSAL | Status: DC

## 2022-08-24 MED ORDER — SODIUM CHLORIDE 0.9 % IV SOLN: 250.0000 mL | INTRAVENOUS | Status: DC

## 2022-08-24 MED ORDER — SODIUM CHLORIDE 0.9 % IV SOLN: INTRAVENOUS | Status: DC

## 2022-08-24 MED ORDER — ACETAMINOPHEN 650 MG RE SUPP: 650.0000 mg | Freq: Four times a day (QID) | RECTAL | Status: DC | PRN

## 2022-08-25 LAB — BLOOD GAS, VENOUS
Acid-base deficit: 4.8 mmol/L — ABNORMAL HIGH (ref 0.0–2.0)
Bicarbonate: 21.6 mmol/L (ref 20.0–28.0)
O2 Saturation: 31.6 %
Patient temperature: 37
pCO2, Ven: 44 mmHg (ref 44–60)
pH, Ven: 7.3 (ref 7.25–7.43)

## 2022-08-26 LAB — CULTURE, BLOOD (ROUTINE X 2)
Culture: NO GROWTH
Culture: NO GROWTH
Special Requests: ADEQUATE
Special Requests: ADEQUATE

## 2022-09-17 NOTE — Progress Notes (Signed)
Patient's false teeth placed in denture cup and into body bag with patient's body. Wife Derrick Jones confirmed on the phone that she took all other patient belongings with her prior to patient's transfer to [facility] from [facility]. No other belongings seen in room.

## 2022-09-17 NOTE — Progress Notes (Signed)
Was called by tele about 0010 and was told pt tele reading asystole then HR of 40.  Said she had called the charge nurse who was in the room dealing with a death.  I was in a COVID isolation room so I yelled out for another nurse to go into room while I was doffing.  Upon entering room at Derrick Jones was already performing chest compressions and Code was called.  He was previously resting in bed and was following some commands and had very garbled speech.  Was brought to [facility] room 26 by code team and report given to Baxter Hire, [place]

## 2022-09-17 NOTE — Death Summary Note (Signed)
DEATH SUMMARY   Patient Details  Name: Derrick Jones MRN: [id number] DOB: 1946-04-10  Admission/Discharge Information   Admit Date:  Aug 24, 2022  Date of Death:  08/26/22  Time of Death:  0203  Length of Stay: 2  Referring Physician: Cletis Athens, MD   Reason(s) for Hospitalization  Acute encephalopathy, concern for seizure   Diagnoses  Preliminary cause of death: Failure to thrive in adult  Secondary Diagnoses (including complications and co-morbidities):  Principal Problem:   Acute encephalopathy Active Problems:   Adjustment disorder with mixed anxiety and depressed mood   Anxiety   Essential hypertension   History of gastric ulcer   Heartburn   History of alcohol abuse   Hypercholesterolemia   Cardiac arrest (Derrick Jones)   Shock ([place])   Acute respiratory failure with hypoxia ([place])   Pneumothorax, traumatic   Encephalopathy acute Hepatic steatosis  Thrombocytopenia AKI Glucosuria  Hypokalemia Hypomagnesemia  Severe protein calorie malnutrition  Failure to thrive in adult Starvation ketosis  Possible seizure  Elevated transaminases Hyperbilirubinemia  Hyperglycemia  Esophageal stricture  DNR status Encounter for palliative care   Brief Hospital Course (including significant findings, care, treatment, and services provided and events leading to death)  Derrick Jones is a 77 y.o. year old male who was admitted to [facility] 1/5 for progressive functional decline and altered mental status-- with a history of having episodes where his body would stiffen and then become unconscious. He has a history of depression/anxiety/etoh abuse (in remission x 3 weeks) and unintentional weight loss, severe protein calorie malnutrition and failure to thrive among other comorbid conditions.  Course at [facility] included neuro-imaging which was unremarkable, as well as lab investigation which was not fruitful in determining the etiology of his progressive decline and stiffening episodes. He was started  on high dose B12 with concern for Wernicke's given his alcohol use history, as well as CIWA.   With concern for seizure, neurology was consulted 1/6, and he was started on Keppra. He was transferred to [facility] on Aug 24, 2022 for further workup and care. On 1/7 EEG did not capture evidence of seizure. His mental status continued to decline. On August 26, 2022, he sustained a cardiac arrest, with ROSC after about 10 minutes. He was transferred to the intensive care unit, intubated and in shock requiring escalating doses of pressors. He sustained rib fractures and a right sided pneumothorax due to chest compressions. In discussion with his wife, it was felt that ongoing critical interventions did not align with [name]'s values. Instead, a decision was reached to compassionately transition to comfort care.    Derrick Jones died Aug 26, 2022 at 0203 peacefully and without pain.     Pertinent Labs and Studies  Significant Diagnostic Studies Portable Chest x-ray  Result Date: 08-26-22 CLINICAL DATA:  Evaluate endotracheal tube EXAM: PORTABLE CHEST 1 VIEW COMPARISON:  Radiograph 08/21/2022 CT 08/21/2022 FINDINGS: Endotracheal tube tip in the intrathoracic trachea 2.8 cm from the carina. Defibrillator pads. Stable cardiomediastinal silhouette. Aortic atherosclerotic calcification. New moderate right pneumothorax. No definite mediastinal shift. No focal consolidation or pleural effusion. Acute anterolateral right fourth rib fracture IMPRESSION: New moderate right pneumothorax.  No evidence of mediastinal shift. Acute right fourth rib fracture. These results were called by telephone at the time of interpretation on August 26, 2022 at 1:19 am to provider Derrick Jones , who verbally acknowledged these results. Electronically Signed   By: Derrick Jones M.D.   On: 08-26-22 01:20   Overnight EEG with video  Result Date: 08/23/2022 Derrick Havens, MD  08/23/2022  6:44 AM Patient Name: Derrick Jones MRN: [id number] Epilepsy Attending: Lora Jones Referring Physician/Provider: Lorenza Chick, MD Duration: 08/28/2022 1906 to 08/23/2022 0645 Patient history: 77 yo man with hx HTN, DM2, EtOH abuse abstinent x1 mo per wife who presented with AMS since yesterday AM. EEG to evaluate for seizure. Level of alertness: Awake, asleep AEDs during EEG study: LEV Technical aspects: This EEG study was done with scalp electrodes positioned according to the 10-20 International system of electrode placement. Electrical activity was reviewed with band pass filter of 1-'70Hz'$ , sensitivity of 7 uV/mm, display speed of 70m/sec with a '60Hz'$  notched filter applied as appropriate. EEG data were recorded continuously and digitally stored.  Video monitoring was available and reviewed as appropriate. Description: The posterior dominant rhythm consists of 8 Hz activity of moderate voltage (25-35 uV) seen predominantly in posterior head regions, symmetric and reactive to eye opening and eye closing. Sleep was characterized by vertex waves, sleep spindles (12 to 14 Hz), maximal frontocentral region. Hyperventilation and photic stimulation were not performed.   IMPRESSION: This study is within normal limits. No seizures or epileptiform discharges were seen throughout the recording. A normal interictal EEG does not exclude the diagnosis of epilepsy. PLora Jones  MR BRAIN WO CONTRAST  Result Date: 08/21/2022 CLINICAL DATA:  Weakness and altered mental status EXAM: MRI HEAD WITHOUT CONTRAST TECHNIQUE: Multiplanar, multiecho pulse sequences of the brain and surrounding structures were obtained without intravenous contrast. COMPARISON:  No prior MRI, correlation is made with CT head 08/21/2022 FINDINGS: Brain: No restricted diffusion to suggest acute or subacute infarct.No acute hemorrhage, mass, mass effect, or midline shift. No hydrocephalus or extra-axial collection.No hemosiderin deposition to suggest remote hemorrhage.Normal pituitary and craniocervical junction. Mildly advanced  cerebral volume loss for age, without definite lobar predominance. Ventricular and sulcal size are commensurate. Confluent T2 hyperintense signal in the periventricular white matter, likely the sequela of moderate chronic small vessel ischemic disease. Vascular: Patent arterial flow voids. Skull and upper cervical spine: Normal marrow signal. Sinuses/Orbits: Clear paranasal sinuses.No acute finding in the orbits. Other: The mastoid air cells are well aerated. IMPRESSION: No acute intracranial process. No evidence of acute or subacute infarct. Electronically Signed   By: AMerilyn BabaM.D.   On: 08/21/2022 23:44   CT CHEST ABDOMEN PELVIS WO CONTRAST  Result Date: 08/21/2022 CLINICAL DATA:  Unintentional weight loss EXAM: CT CHEST, ABDOMEN AND PELVIS WITHOUT CONTRAST TECHNIQUE: Multidetector CT imaging of the chest, abdomen and pelvis was performed following the standard protocol without IV contrast. RADIATION DOSE REDUCTION: This exam was performed according to the departmental dose-optimization program which includes automated exposure control, adjustment of the mA and/or kV according to patient size and/or use of iterative reconstruction technique. COMPARISON:  CT abdomen pelvis 02/08/2013 FINDINGS: CT CHEST FINDINGS Cardiovascular: Extensive multi-vessel coronary artery calcification. Global cardiac size within normal limits. No pericardial effusion. Central pulmonary arteries are of normal caliber. Mild atherosclerotic calcification within the thoracic aorta. No aortic aneurysm. Mediastinum/Nodes: No enlarged mediastinal, hilar, or axillary lymph nodes. Thyroid gland, trachea, and esophagus demonstrate no significant findings. Lungs/Pleura: Mild emphysema. Bronchial wall thickening present in keeping with airway inflammation. No confluent pulmonary infiltrate. No pneumothorax or pleural effusion. No central obstructing lesion. Musculoskeletal: Osseous structures are diffusely osteopenic. Multiple midthoracic  remote appearing compression deformities are seen with mild loss of height. No acute bone abnormality. No lytic or blastic bone lesion. CT ABDOMEN PELVIS FINDINGS Hepatobiliary: Cholelithiasis without pericholecystic inflammatory change. No focal liver  abnormality seen on this noncontrast examination. No intra or extrahepatic biliary ductal dilation. Pancreas: Unremarkable Spleen: Unremarkable Adrenals/Urinary Tract: The adrenal glands are unremarkable. The kidneys are normal in size and position. Multiple simple cortical cysts are seen within the kidneys bilaterally. No follow-up imaging is recommended for these lesions. No hydronephrosis. No intrarenal or ureteral calculi. The bladder is distended but is otherwise unremarkable. Stomach/Bowel: The stomach, small bowel, and large bowel are unremarkable save for mild descending colonic diverticulosis. No evidence of obstruction or focal inflammation. Gas beneath the right hemidiaphragm is within the hepatic flexure of the colon. No free intraperitoneal gas or fluid. Appendix normal. Vascular/Lymphatic: Aortic atherosclerosis. No enlarged abdominal or pelvic lymph nodes. Reproductive: Prostate is unremarkable. Other: No abdominal wall hernia. Musculoskeletal: Left hip ORIF has been performed. Osseous structures are diffusely osteopenic. No acute bone abnormality. No lytic or blastic bone lesion. IMPRESSION: 1. No acute intrathoracic or intra-abdominal pathology identified. No definite radiographic explanation for the patient's reported symptoms. 2. Extensive multi-vessel coronary artery calcification. 3. Mild emphysema. 4. Cholelithiasis. 5. Mild descending colonic diverticulosis. Aortic Atherosclerosis (ICD10-I70.0) and Emphysema (ICD10-J43.9). Electronically Signed   By: Fidela Salisbury M.D.   On: 08/21/2022 22:51   US RENAL  Result Date: 08/21/2022 CLINICAL DATA:  Renal dysfunction EXAM: RENAL / URINARY TRACT ULTRASOUND COMPLETE COMPARISON:  11/19/2016 FINDINGS:  Right Kidney: Renal measurements: 10.6 x 6.3 x 5.4 cm = volume: 180.5 mL. There is no hydronephrosis. There is 1.6 cm cyst in the midportion. Left Kidney: Renal measurements: 11.5 x 6.6 x 5.8 cm = volume: 228.9 mL. There is no hydronephrosis. There is 2.9 x 1.7 cm cyst in the midportion with thin internal septation. This may be due to confluence of 2 adjacent cysts. Bladder: Appears normal for degree of bladder distention. Other: None. IMPRESSION: There is no hydronephrosis.  Bilateral renal cysts. Electronically Signed   By: Elmer Picker M.D.   On: 08/21/2022 21:06   US Abdomen Limited RUQ (LIVER/GB)  Result Date: 08/21/2022 CLINICAL DATA:  Elevated liver function test. EXAM: ULTRASOUND ABDOMEN LIMITED RIGHT UPPER QUADRANT COMPARISON:  None Available. FINDINGS: Gallbladder: No gallstones or wall thickening visualized (2.2 mm). No sonographic Murphy sign noted by sonographer. Common bile duct: Diameter: 4.6 mm Liver: No focal lesion identified. Diffusely increased echogenicity of the liver parenchyma is noted. Portal vein is patent on color Doppler imaging with normal direction of blood flow towards the liver. Other: Limited study secondary to limited patient cooperation during the study. IMPRESSION: Hepatic steatosis without focal liver lesions. Electronically Signed   By: Virgina Norfolk M.D.   On: 08/21/2022 21:06   CT HEAD WO CONTRAST (5MM)  Result Date: 08/21/2022 CLINICAL DATA:  Weakness, illness since Christmas EXAM: CT HEAD WITHOUT CONTRAST TECHNIQUE: Contiguous axial images were obtained from the base of the skull through the vertex without intravenous contrast. RADIATION DOSE REDUCTION: This exam was performed according to the departmental dose-optimization program which includes automated exposure control, adjustment of the mA and/or kV according to patient size and/or use of iterative reconstruction technique. COMPARISON:  02/12/2020 FINDINGS: Brain: No acute infarct or hemorrhage. Lateral  ventricles and midline structures are unremarkable. No acute extra-axial fluid collections. No mass effect. Stable cerebral atrophy. Vascular: No hyperdense vessel or unexpected calcification. Skull: Normal. Negative for fracture or focal lesion. Sinuses/Orbits: No acute finding. Other: None. IMPRESSION: 1. Stable head CT, no acute intracranial process. Electronically Signed   By: Randa Ngo M.D.   On: 08/21/2022 18:10   DG Chest [facility]  Result Date: 08/21/2022 CLINICAL DATA:  Sepsis.  Weakness and not acting himself. EXAM: PORTABLE CHEST 1 VIEW COMPARISON:  Chest radiograph dated May 04, 2020 FINDINGS: The heart size and mediastinal contours are within normal limits. Aortic atherosclerotic calcifications. Both lungs are clear. The visualized skeletal structures are unremarkable. IMPRESSION: No active disease. Electronically Signed   By: Keane Police D.O.   On: 08/21/2022 17:06    Microbiology Recent Results (from the past 240 hour(s))  Culture, blood (Routine x 2)     Status: None (Preliminary result)   Collection Time: 08/21/22  4:47 PM   Specimen: Right Antecubital; Blood  Result Value Ref Range Status   Specimen Description RIGHT ANTECUBITAL  Final   Special Requests   Final    BOTTLES DRAWN AEROBIC AND ANAEROBIC Blood Culture adequate volume   Culture   Final    NO GROWTH 2 DAYS Performed at [facility], [address], [place], [place] [address]    Report Status PENDING  Incomplete  Culture, blood (Routine x 2)     Status: None (Preliminary result)   Collection Time: 08/21/22  4:47 PM   Specimen: BLOOD RIGHT ARM  Result Value Ref Range Status   Specimen Description BLOOD RIGHT ARM  Final   Special Requests   Final    BOTTLES DRAWN AEROBIC AND ANAEROBIC Blood Culture adequate volume   Culture   Final    NO GROWTH 2 DAYS Performed at [facility], [address] [place], [place] [address]    Report Status PENDING  Incomplete  Resp panel by  RT-PCR (RSV, Flu A&B, Covid) Anterior Nasal Swab     Status: None   Collection Time: 08/21/22  8:32 PM   Specimen: Anterior Nasal Swab  Result Value Ref Range Status   SARS Coronavirus 2 by RT PCR NEGATIVE NEGATIVE Final    Comment: (NOTE) SARS-CoV-2 target nucleic acids are NOT DETECTED.  The SARS-CoV-2 RNA is generally detectable in upper respiratory specimens during the acute phase of infection. The lowest concentration of SARS-CoV-2 viral copies this assay can detect is 138 copies/mL. A negative result does not preclude SARS-Cov-2 infection and should not be used as the sole basis for treatment or other patient management decisions. A negative result may occur with  improper specimen collection/handling, submission of specimen other than nasopharyngeal swab, presence of viral mutation(s) within the areas targeted by this assay, and inadequate number of viral copies(<138 copies/mL). A negative result must be combined with clinical observations, patient history, and epidemiological information. The expected result is Negative.  Fact Sheet for Patients:  EntrepreneurPulse.com.au  Fact Sheet for Healthcare Providers:  IncredibleEmployment.be  This test is no t yet approved or cleared by the [place] FDA and  has been authorized for detection and/or diagnosis of SARS-CoV-2 by FDA under an Emergency Use Authorization (EUA). This EUA will remain  in effect (meaning this test can be used) for the duration of the COVID-19 declaration under Section 564(b)(1) of the Act, 21 U.S.C.section 360bbb-3(b)(1), unless the authorization is terminated  or revoked sooner.       Influenza A by PCR NEGATIVE NEGATIVE Final   Influenza B by PCR NEGATIVE NEGATIVE Final    Comment: (NOTE) The Xpert Xpress SARS-CoV-2/FLU/RSV plus assay is intended as an aid in the diagnosis of influenza from Nasopharyngeal swab specimens and should not be used as a sole basis  for treatment. Nasal washings and aspirates are unacceptable for Xpert Xpress SARS-CoV-2/FLU/RSV testing.  Fact Sheet for  Patients: EntrepreneurPulse.com.au  Fact Sheet for Healthcare Providers: IncredibleEmployment.be  This test is not yet approved or cleared by the [place] FDA and has been authorized for detection and/or diagnosis of SARS-CoV-2 by FDA under an Emergency Use Authorization (EUA). This EUA will remain in effect (meaning this test can be used) for the duration of the COVID-19 declaration under Section 564(b)(1) of the Act, 21 U.S.C. section 360bbb-3(b)(1), unless the authorization is terminated or revoked.     Resp Syncytial Virus by PCR NEGATIVE NEGATIVE Final    Comment: (NOTE) Fact Sheet for Patients: EntrepreneurPulse.com.au  Fact Sheet for Healthcare Providers: IncredibleEmployment.be  This test is not yet approved or cleared by the [place] FDA and has been authorized for detection and/or diagnosis of SARS-CoV-2 by FDA under an Emergency Use Authorization (EUA). This EUA will remain in effect (meaning this test can be used) for the duration of the COVID-19 declaration under Section 564(b)(1) of the Act, 21 U.S.C. section 360bbb-3(b)(1), unless the authorization is terminated or revoked.  Performed at [facility], [address] [place], [place] [address]     Lab Basic Metabolic Panel: Recent Labs  Lab 08/21/22 1629 08/21/22 1846 09/06/2022 5038 09/16/2022 0627 08/23/22 0307 08/23/22 0752  NA 141  --   --  146* 143  --   K 3.7  --   --  3.1* 2.8*  --   CL 96*  --   --  105 103  --   CO2 14*  --   --  29 24  --   GLUCOSE 104*  --   --  97 106*  --   BUN 50*  --   --  28* 14  --   CREATININE 1.95*  --   --  0.87 0.82  --   CALCIUM 9.5  --   --  8.5* 8.3*  --   MG  --  2.2 1.8  --   --  1.5*   Liver Function Tests: Recent Labs  Lab 08/21/22 1629  09/13/2022 0627 08/23/22 0307  AST 83* 56* 49*  ALT 59* 47* 43  ALKPHOS 66 47 48  BILITOT 3.1* 2.0* 1.7*  PROT 8.0 6.2* 5.8*  ALBUMIN 4.6 3.5 3.2*   No results for input(s): "LIPASE", "AMYLASE" in the last 168 hours. Recent Labs  Lab 08/21/22 1846  AMMONIA 16   CBC: Recent Labs  Lab 08/21/22 1629 09/13/2022 0627 08/23/22 0307  WBC 9.3 7.8 7.0  NEUTROABS 6.9  --   --   HGB 15.0 12.8* 13.6  HCT 45.5 37.6* 39.8  MCV 95.8 93.5 94.3  PLT 152 108* 89*   Cardiac Enzymes: Recent Labs  Lab 08/21/22 1846  CKTOTAL 330   Sepsis Labs: Recent Labs  Lab 08/21/22 1629 08/21/22 1647 08/17/2022 0627 08/23/22 0307  WBC 9.3  --  7.8 7.0  LATICACIDVEN 5.2* 2.4*  --   --     Procedures/Operations  1/8 ETT 23-Sep-2022 arterial line    Cristal Generous September 23, 2022, 2:43 AM

## 2022-09-17 NOTE — Procedures (Signed)
Extubation Procedure Note  Patient Details:   Name: Derrick Jones DOB: [birth year] MRN: [id number]   Airway Documentation:    Vent end date: 09-16-2022 Vent end time: 0125   Pt terminal extubated per MD order.   Clance Boll 09/16/22, 1:29 AM

## 2022-09-17 NOTE — Progress Notes (Signed)
Patient time of death 0203 (following Comfort Care), confirmed by this RN and Willaim Rayas, RN. [facility] providers and Eliseo Gum, CCNP made aware. This RN called and spoke with pt's wife Marisa Severin to inform her of pt's passing and to confirm the name and address of their funeral home of choice (see Post Mortem Checklist). This RN expressed sympathy and offered emotional support while on the phone with pt's wife. [facility] then contacted. Per [employer], pt's eyes prepped with saline-soaked gauze. Patient Placement also called by this RN and informed of pt cardiac death.

## 2022-09-17 NOTE — Procedures (Signed)
Intubation Procedure Note  Derrick Jones  [id number]  November 28, 1945  Date:09/19/2022  Time:12:34 AM   Provider Performing:Fields Oros, Deforest Hoyles    Procedure: Intubation (30076)  Indication(s) Respiratory Failure  Consent Unable to obtain consent due to emergent nature of procedure.   Anesthesia Post Cardiac arrest     Time Out Verified patient identification, verified procedure, site/side was marked, verified correct patient position, special equipment/implants available, medications/allergies/relevant history reviewed, required imaging and test results available.   Sterile Technique Usual hand hygeine, masks, and gloves were used   Procedure Description Patient positioned in bed supine.  Sedation given as noted above.  Patient was intubated with endotracheal tube using Glidescope.  View was Grade 1 full glottis .  Number of attempts was 1.  Colorimetric CO2 detector was consistent with tracheal placement.   Complications/Tolerance None; patient tolerated the procedure well. Chest X-ray is ordered to verify placement.   EBL Minimal   Specimen(s) None

## 2022-09-17 NOTE — Procedures (Signed)
Patient Name: Derrick Jones  MRN: [id number]  Epilepsy Attending: Lora Havens  Referring Physician/Provider: Lorenza Chick, MD  Duration: 08/23/2022 1906 to 09/04/2022 0008   Patient history: 77 yo man with hx HTN, DM2, EtOH abuse abstinent x1 mo per wife who presented with AMS since yesterday AM. EEG to evaluate for seizure.   Level of alertness: Awake, asleep   AEDs during EEG study: LEV   Technical aspects: This EEG study was done with scalp electrodes positioned according to the 10-20 International system of electrode placement. Electrical activity was reviewed with band pass filter of 1-'70Hz'$ , sensitivity of 7 uV/mm, display speed of 72m/sec with a '60Hz'$  notched filter applied as appropriate. EEG data were recorded continuously and digitally stored.  Video monitoring was available and reviewed as appropriate.   Description: The posterior dominant rhythm consists of 8 Hz activity of moderate voltage (25-35 uV) seen predominantly in posterior head regions, symmetric and reactive to eye opening and eye closing. Sleep was characterized by vertex waves, sleep spindles (12 to 14 Hz), maximal frontocentral region. At 2355 on 08/23/2022, EEG showed generalized 3-'6hz'$  theta-delta slowing followed by generalized background suppression. CPR was started at 0[place] 101/19/24 EEG was disconnected and patient was transferred to ICU. Hyperventilation and photic stimulation were not performed.      IMPRESSION: This study was initially within normal limits. No seizures or epileptiform discharges were seen throughout the recording.   At 2355 on 08/23/2022, EEG showed generalized 3-'6hz'$  theta-delta slowing followed by generalized background suppression. CPR was started at 0[place] 119-Jan-2024 EEG was disconnected and patient was transferred to ICU.    Deneshia Zucker OBarbra Sarks

## 2022-09-17 NOTE — Code Documentation (Addendum)
  Patient Name: Derrick Jones   MRN: [id number]   Date of Birth/ Sex: 1945/12/23 , male      Admission Date: 09/15/2022  Attending Provider: Bonnielee Haff, MD  Primary Diagnosis: Acute encephalopathy   Indication: Pt was in his usual state of health until this AM at 12:07, last known normal ~11:00pm, when he was noted to be unresponsive. Code blue was subsequently called. At the time of arrival on scene, ACLS protocol was underway.   Technical Description:  - CPR performance duration:  10 minutes  - Was defibrillation or cardioversion used? No   - Was external pacer placed? Yes  - Was patient intubated pre/post CPR? Yes   Medications Administered: Y = Yes; Blank = No Amiodarone    Atropine    Calcium    Epinephrine  Y  Lidocaine    Magnesium  Y  Norepinephrine    Phenylephrine    Sodium bicarbonate  Y  Vasopressin     Post CPR evaluation:  - Final Status - Was patient successfully resuscitated ? Yes - What is current rhythm? Sinus tachycardia with preventricular contractions - What is current hemodynamic status? unstable  Miscellaneous Information:  - Labs sent, including: CBC, CMP, CBG, Troponin, Lactate, Chest Radiograph  - Primary team notified?  Yes, spoke with primary team   - Family Notified? Yes, spoke with spouse  - Additional notes/ transfer status: Transferred to ICU following RoSC     Serita Butcher, MD  2022/09/18, 12:34 AM

## 2022-09-17 NOTE — Procedures (Signed)
Arterial Catheter Insertion Procedure Note  Derrick Jones  [id number]  Feb 14, 1946  Date:2022/09/20  Time:1:22 AM    Provider Performing: Ulice Dash    Procedure: Insertion of Arterial Line [phone]) with US guidance ([id number])   Indication(s) Blood pressure monitoring and/or need for frequent ABGs  Consent Risks of the procedure as well as the alternatives and risks of each were explained to the patient and/or caregiver.  Consent for the procedure was obtained and is signed in the bedside chart  Anesthesia None   Time Out Verified patient identification, verified procedure, site/side was marked, verified correct patient position, special equipment/implants available, medications/allergies/relevant history reviewed, required imaging and test results available.   Sterile Technique Maximal sterile technique including full sterile barrier drape, hand hygiene, sterile gown, sterile gloves, mask, hair covering, sterile ultrasound probe cover (if used).   Procedure Description Area of catheter insertion was cleaned with chlorhexidine and draped in sterile fashion. Without real-time ultrasound guidance an arterial catheter was placed into the right radial artery.  Appropriate arterial tracings confirmed on monitor.     Complications/Tolerance None; patient tolerated the procedure well.   EBL Minimal   Specimen(s) None

## 2022-09-17 NOTE — Consult Note (Signed)
NAME:  ELIAZER HEMPHILL, MRN:  [id number], DOB:  1946/03/29, LOS: 2 ADMISSION DATE:  09/10/2022, CONSULTATION DATE:  20-Sep-2022 REFERRING MD:  Code Team (on [facility] - service), CHIEF COMPLAINT:  cardiac arrest    History of Present Illness:  77 yo M PMH etoh use, HLD, PUD, HTN, esophageal stricture who presented to [facility] ED 1/5 with AMS. There is a reported hx of the patient going stiff and then unconscious. He was transferred to [facility] with concern for possible seizure and need for EEG, admitting to [facility] on 1/6.   There has not been a captured seizure thus far. Reportedly stopped drinking 3 weeks ago but is on CIWA, as well as [place] dose thiamine.  On 09-20-2022 patient had a cardiac arrest  last seen normal at 2300. - -found in "asystole" at [place], however there are conflicting reports of this and may be PEA . ROSC achieved after 10 minutes.   PCCM was consulted in this setting     Pertinent  Medical History  Etoh abuse Depression Anziety GERD PUD HLD Esophageal stricture  DM  Significant Hospital Events: Including procedures, antibiotic start and stop dates in addition to other pertinent events   1/5 admit to [facility]. Neg HIV RPR  1/6 transfer to [facility] 1/7 EEG without sz 1/8 cardiac arrest, ICU transfer   Interim History / Subjective:  ROSC achieved   Objective   Blood pressure (!) 52/44, pulse (!) 105, temperature 98.6 F (37 C), temperature source Oral, resp. rate (!) 22, height '5\' 7"'$  (1.702 m), SpO2 92 %.    Vent Mode: PRVC FiO2 (%):  [100 %] 100 % Set Rate:  [22 bmp] 22 bmp Vt Set:  [520 mL] 520 mL PEEP:  [5 cmH20] 5 cmH20   Intake/Output Summary (Last 24 hours) at 09-20-2022 0123 Last data filed at 08/23/2022 2015 Gross per 24 hour  Intake --  Output 1200 ml  Net -1200 ml   There were no vitals filed for this visit.  Examination: General: chronically and critically ill appearing elderly M intubated  HENT: NCAT pink tacky mm ETT secure anicteric sclera  Lungs: Symmetrical chest  expansion, mechanically ventilated  Cardiovascular: tachycardic, cap refill is sluggish  Abdomen: thin ndnt  Extremities: Muscle wasting BUE BLE  Neuro: 42m pupils, fixed. No cough/gag. Does initiate respirations. No response to painful stimuli  GU: urinary pouch   Resolved Hospital Problem list     Assessment & Plan:   Encounter for palliative care DNR discussion/DNR status  In hospital cardiac arrest Shock  Acute respiratory failure with hypoxia R ptx Traumatic rib fx 2/2 CPR Acute encephalopathy, concern for anoxic encephalopathy + metabolic encephalopathy/possible Wernicke's  Severe chronic malnutrition  Thrombocytopenia Hypokalemia Hypomagnesemia Elevated LFTs AKI HTN DM2  Depression Anxiety  -while stabilizing patient in the ICU he continued to decompensate despite IVF, bicarb, epi push and escalating doses of pressors. I called his wife, LMarisa Severinwho felt that BElbertwould not want further CPR or ICU procedures and ongoing current aggressive measures (we discussed indications for central line, arterial line and chest tube placement, as well as escalating hemodynamic support)  -A decision was reached for DNR status and to transition to comfort measures. Wife understands he will likely die tonight -will order for morphine gtt and PRN BZD  -DNR status    Best Practice (right click and "Reselect all SmartList Selections" daily)   Diet/type: NPO DVT prophylaxis: not indicated GI prophylaxis: N/A Lines: N/A Foley:  N/A Code Status:  DNR Last date of multidisciplinary goals of care discussion [1/8] D/w wife Marisa Severin, transitioning to comfort care, Expect in-hospital death   Labs   CBC: Recent Labs  Lab 08/21/22 1629 09/10/2022 0627 08/23/22 0307  WBC 9.3 7.8 7.0  NEUTROABS 6.9  --   --   HGB 15.0 12.8* 13.6  HCT 45.5 37.6* 39.8  MCV 95.8 93.5 94.3  PLT 152 108* 89*    Basic Metabolic Panel: Recent Labs  Lab 08/21/22 1629 08/21/22 1846 09/12/2022 0619  09/14/2022 0627 08/23/22 0307 08/23/22 0752  NA 141  --   --  146* 143  --   K 3.7  --   --  3.1* 2.8*  --   CL 96*  --   --  105 103  --   CO2 14*  --   --  29 24  --   GLUCOSE 104*  --   --  97 106*  --   BUN 50*  --   --  28* 14  --   CREATININE 1.95*  --   --  0.87 0.82  --   CALCIUM 9.5  --   --  8.5* 8.3*  --   MG  --  2.2 1.8  --   --  1.5*   GFR: Estimated Creatinine Clearance: 56.5 mL/min (by C-G formula based on SCr of 0.82 mg/dL). Recent Labs  Lab 08/21/22 1629 08/21/22 1647 08/29/2022 0627 08/23/22 0307  WBC 9.3  --  7.8 7.0  LATICACIDVEN 5.2* 2.4*  --   --     Liver Function Tests: Recent Labs  Lab 08/21/22 1629 09/16/2022 0627 08/23/22 0307  AST 83* 56* 49*  ALT 59* 47* 43  ALKPHOS 66 47 48  BILITOT 3.1* 2.0* 1.7*  PROT 8.0 6.2* 5.8*  ALBUMIN 4.6 3.5 3.2*   No results for input(s): "LIPASE", "AMYLASE" in the last 168 hours. Recent Labs  Lab 08/21/22 1846  AMMONIA 16    ABG    Component Value Date/Time   HCO3 21.6 08/21/2022 1726   ACIDBASEDEF 4.8 (H) 08/21/2022 1726   O2SAT 31.6 08/21/2022 1726     Coagulation Profile: Recent Labs  Lab 08/21/22 1846 09/04/2022 0627  INR 1.1 1.0    Cardiac Enzymes: Recent Labs  Lab 08/21/22 1846  CKTOTAL 330    HbA1C: Hemoglobin A1C  Date/Time Value Ref Range Status  10/20/2016 12:00 AM 6.1  Final   Hgb A1c MFr Bld  Date/Time Value Ref Range Status  05/02/2020 12:35 PM 6.4 (H) 4.8 - 5.6 % Final    Comment:    (NOTE) Pre diabetes:          5.7%-6.4%  Diabetes:              >6.4%  Glycemic control for   <7.0% adults with diabetes   05/09/2019 06:31 AM 5.6 4.8 - 5.6 % Final    Comment:    (NOTE)         Prediabetes: 5.7 - 6.4         Diabetes: >6.4         Glycemic control for adults with diabetes: <7.0     CBG: Recent Labs  Lab 08/23/22 0801 08/23/22 1151 08/23/22 1646 08/23/22 2048 2022-08-27 0014  GLUCAP 130* 129* 119* 149* 202*    Review of Systems:   Unable to obtain due  to encephalopathy  Past Medical History:  He,  has a past medical history of Alcohol abuse (02/27/2020), Alcohol use with withdrawal  delirium ([place]) (88/50/2774), Alcoholic ketoacidosis (12/87/8676), ARF (acute renal failure) ([place]) (08/21/2022), Closed displaced intertrochanteric fracture of left femur ([place]) (05/02/2020), Diabetes mellitus without complication ([place]), GERD (gastroesophageal reflux disease), Hypertension, and Hyponatremia (02/03/2021).   Surgical History:   Past Surgical History:  Procedure Laterality Date   COLONOSCOPY WITH PROPOFOL N/A 07/21/2019   Procedure: COLONOSCOPY WITH PROPOFOL;  Surgeon: Jonathon Bellows, MD;  Location: [facility] ENDOSCOPY;  Service: Gastroenterology;  Laterality: N/A;   ESOPHAGOGASTRODUODENOSCOPY (EGD) WITH PROPOFOL N/A 03/11/2020   Procedure: ESOPHAGOGASTRODUODENOSCOPY (EGD) WITH PROPOFOL;  Surgeon: Jonathon Bellows, MD;  Location: [facility] ENDOSCOPY;  Service: Gastroenterology;  Laterality: N/A;   ESOPHAGOGASTRODUODENOSCOPY (EGD) WITH PROPOFOL N/A 04/11/2020   Procedure: ESOPHAGOGASTRODUODENOSCOPY (EGD) WITH PROPOFOL;  Surgeon: Jonathon Bellows, MD;  Location: [facility] ENDOSCOPY;  Service: Gastroenterology;  Laterality: N/A;   ESOPHAGOGASTRODUODENOSCOPY (EGD) WITH PROPOFOL N/A 07/29/2020   Procedure: ESOPHAGOGASTRODUODENOSCOPY (EGD) WITH PROPOFOL;  Surgeon: Jonathon Bellows, MD;  Location: [facility] ENDOSCOPY;  Service: Gastroenterology;  Laterality: N/A;   EYE SURGERY     FEMUR FRACTURE SURGERY Left    FRACTURE SURGERY     INTRAMEDULLARY (IM) NAIL INTERTROCHANTERIC Left 05/03/2020   Procedure: INTRAMEDULLARY (IM) NAIL INTERTROCHANTRIC;  Surgeon: Leim Fabry, MD;  Location: [facility] ORS;  Service: Orthopedics;  Laterality: Left;     Social History:   reports that he has been smoking cigarettes. He has a 53.00 pack-year smoking history. He has never used smokeless tobacco. He reports current alcohol use. He reports that he does not use drugs.   Family History:  His family history includes  Brain cancer in his daughter; Breast cancer in his sister; Ovarian cancer in his sister.   Allergies No Known Allergies   Home Medications  Prior to Admission medications   Medication Sig Start Date End Date Taking? Authorizing Provider  albuterol (VENTOLIN HFA) 108 (90 Base) MCG/ACT inhaler Inhale 2 puffs into the lungs every 6 (six) hours as needed for wheezing or shortness of breath. 11/17/21   Cletis Athens, MD  albuterol (VENTOLIN HFA) 108 (90 Base) MCG/ACT inhaler Inhale 2 puffs into the lungs every 4 (four) hours as needed. 05/04/22   Cletis Athens, MD  atenolol (TENORMIN) 100 MG tablet Take 1 tablet (100 mg total) by mouth daily. 04/30/22   Cletis Athens, MD  citalopram (CELEXA) 10 MG tablet Take 1 tablet (10 mg total) by mouth daily. 07/08/21   Cletis Athens, MD  fluticasone (FLOVENT HFA) 110 MCG/ACT inhaler Inhale 1 puff into the lungs 2 (two) times daily. 02/05/21   Loletha Grayer, MD  glucose blood (ONETOUCH VERIO) test strip Use as instructed 05/08/21   Cletis Athens, MD  glucose blood test strip Check blood sugar 2 times daily 05/06/21   Cletis Athens, MD  levETIRAcetam (KEPRRA) 500 MG/100ML SOLN Inject 100 mLs (500 mg total) into the vein every 12 (twelve) hours. 08/30/2022   Wouk, Ailene Rud, MD  Multiple Vitamin (MULTIVITAMIN WITH MINERALS) TABS tablet Take 1 tablet by mouth daily. 02/06/21   Loletha Grayer, MD  omeprazole (PRILOSEC) 20 MG capsule Take 1 capsule (20 mg total) by mouth daily. 03/04/21   Cletis Athens, MD  tamsulosin (FLOMAX) 0.4 MG CAPS capsule Take 1 capsule (0.4 mg total) by mouth daily after supper. 02/05/21   Loletha Grayer, MD  thiamine 100 MG tablet Take 1 tablet (100 mg total) by mouth daily. 02/06/21   Loletha Grayer, MD     Critical care time: 50 minutes     CRITICAL CARE Performed by: Cristal Generous   Total critical care  time: 50 minutes  Critical care time was exclusive of separately billable procedures and treating other patients. Critical care  was necessary to treat or prevent imminent or life-threatening deterioration.  Critical care was time spent personally by me on the following activities: development of treatment plan with patient and/or surrogate as well as nursing, discussions with consultants, evaluation of patient's response to treatment, examination of patient, obtaining history from patient or surrogate, ordering and performing treatments and interventions, ordering and review of laboratory studies, ordering and review of radiographic studies, pulse oximetry and re-evaluation of patient's condition.  Eliseo Gum MSN, AGACNP-BC [place] for pager  2022-09-20, 1:23 AM

## 2022-09-17 NOTE — Progress Notes (Signed)
   2022-09-18 0013  Spiritual Encounters  Type of Visit Initial;Attempt (pt unavailable)  Care provided to: Pt not available  Referral source Code page  Reason for visit Code  OnCall Visit Yes   Chaplain responded to a code blue page. Patient was attended to by the medical team. No family is present. If a chaplain is requested someone will respond.   Danice Goltz [facility]  [phone]

## 2022-09-17 DEATH — deceased

## 2022-10-22 MED FILL — Medication: Qty: 1 | Status: AC
